# Patient Record
Sex: Male | Born: 1955 | Race: White | Hispanic: No | Marital: Married | Smoking: Never smoker
Health system: Southern US, Community
[De-identification: ages and names within clinical notes are randomized; demographics above are authoritative.]

## PROBLEM LIST (undated history)

## (undated) DIAGNOSIS — M109 Gout, unspecified: Secondary | ICD-10-CM

## (undated) DIAGNOSIS — K635 Polyp of colon: Secondary | ICD-10-CM

## (undated) DIAGNOSIS — N189 Chronic kidney disease, unspecified: Secondary | ICD-10-CM

## (undated) DIAGNOSIS — D649 Anemia, unspecified: Secondary | ICD-10-CM

## (undated) DIAGNOSIS — C61 Malignant neoplasm of prostate: Secondary | ICD-10-CM

## (undated) DIAGNOSIS — F32A Depression, unspecified: Secondary | ICD-10-CM

## (undated) DIAGNOSIS — K5792 Diverticulitis of intestine, part unspecified, without perforation or abscess without bleeding: Secondary | ICD-10-CM

## (undated) DIAGNOSIS — F419 Anxiety disorder, unspecified: Secondary | ICD-10-CM

## (undated) DIAGNOSIS — T7840XA Allergy, unspecified, initial encounter: Secondary | ICD-10-CM

## (undated) DIAGNOSIS — K219 Gastro-esophageal reflux disease without esophagitis: Secondary | ICD-10-CM

## (undated) DIAGNOSIS — Z973 Presence of spectacles and contact lenses: Secondary | ICD-10-CM

## (undated) DIAGNOSIS — F329 Major depressive disorder, single episode, unspecified: Secondary | ICD-10-CM

## (undated) DIAGNOSIS — I10 Essential (primary) hypertension: Secondary | ICD-10-CM

## (undated) DIAGNOSIS — I509 Heart failure, unspecified: Secondary | ICD-10-CM

## (undated) DIAGNOSIS — J302 Other seasonal allergic rhinitis: Secondary | ICD-10-CM

## (undated) DIAGNOSIS — G4730 Sleep apnea, unspecified: Secondary | ICD-10-CM

## (undated) DIAGNOSIS — J45909 Unspecified asthma, uncomplicated: Secondary | ICD-10-CM

## (undated) HISTORY — DX: Heart failure, unspecified: I50.9

## (undated) HISTORY — DX: Diverticulitis of intestine, part unspecified, without perforation or abscess without bleeding: K57.92

## (undated) HISTORY — DX: Anxiety disorder, unspecified: F41.9

## (undated) HISTORY — DX: Major depressive disorder, single episode, unspecified: F32.9

## (undated) HISTORY — DX: Anemia, unspecified: D64.9

## (undated) HISTORY — DX: Allergy, unspecified, initial encounter: T78.40XA

## (undated) HISTORY — DX: Malignant neoplasm of prostate: C61

## (undated) HISTORY — DX: Unspecified asthma, uncomplicated: J45.909

## (undated) HISTORY — PX: TONSILLECTOMY: SUR1361

## (undated) HISTORY — DX: Chronic kidney disease, unspecified: N18.9

## (undated) HISTORY — DX: Polyp of colon: K63.5

## (undated) HISTORY — DX: Sleep apnea, unspecified: G47.30

## (undated) HISTORY — PX: MENISCUS REPAIR: SHX5179

## (undated) HISTORY — PX: CARPAL TUNNEL RELEASE: SHX101

## (undated) HISTORY — PX: POLYPECTOMY: SHX149

## (undated) HISTORY — DX: Depression, unspecified: F32.A

---
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
## 1998-07-16 ENCOUNTER — Encounter: Payer: Self-pay | Admitting: Internal Medicine

## 1998-07-16 ENCOUNTER — Ambulatory Visit (HOSPITAL_COMMUNITY): Admission: RE | Admit: 1998-07-16 | Discharge: 1998-07-16 | Payer: Self-pay | Admitting: Internal Medicine

## 1998-09-04 ENCOUNTER — Encounter: Payer: Self-pay | Admitting: Gastroenterology

## 1998-09-04 ENCOUNTER — Ambulatory Visit (HOSPITAL_COMMUNITY): Admission: RE | Admit: 1998-09-04 | Discharge: 1998-09-04 | Payer: Self-pay | Admitting: Gastroenterology

## 1998-12-25 ENCOUNTER — Ambulatory Visit (HOSPITAL_COMMUNITY): Admission: RE | Admit: 1998-12-25 | Discharge: 1998-12-25 | Payer: Self-pay | Admitting: Gastroenterology

## 1998-12-25 ENCOUNTER — Encounter: Payer: Self-pay | Admitting: Gastroenterology

## 2013-02-03 HISTORY — PX: PROSTATECTOMY: SHX69

## 2013-12-02 ENCOUNTER — Other Ambulatory Visit: Payer: Self-pay | Admitting: Orthopedic Surgery

## 2013-12-13 ENCOUNTER — Encounter (HOSPITAL_BASED_OUTPATIENT_CLINIC_OR_DEPARTMENT_OTHER): Payer: Self-pay | Admitting: *Deleted

## 2013-12-13 NOTE — Progress Notes (Signed)
Will need istat ekg 

## 2013-12-13 NOTE — Progress Notes (Signed)
   12/13/13 1637  OBSTRUCTIVE SLEEP APNEA  Have you ever been diagnosed with sleep apnea through a sleep study? No  Do you snore loudly (loud enough to be heard through closed doors)?  1  Do you often feel tired, fatigued, or sleepy during the daytime? 0  Has anyone observed you stop breathing during your sleep? 0  Do you have, or are you being treated for high blood pressure? 1  BMI more than 35 kg/m2? 0  Age over 56 years old? 1  Neck circumference greater than 40 cm/16 inches? 1  Gender: 1  Obstructive Sleep Apnea Score 5  Score 4 or greater  Results sent to PCP

## 2013-12-15 ENCOUNTER — Ambulatory Visit (HOSPITAL_BASED_OUTPATIENT_CLINIC_OR_DEPARTMENT_OTHER): Payer: BC Managed Care – PPO | Admitting: Anesthesiology

## 2013-12-15 ENCOUNTER — Ambulatory Visit (HOSPITAL_BASED_OUTPATIENT_CLINIC_OR_DEPARTMENT_OTHER)
Admission: RE | Admit: 2013-12-15 | Discharge: 2013-12-15 | Disposition: A | Discharge status: Home / Self Care | Payer: BC Managed Care – PPO | Source: Ambulatory Visit | Attending: Orthopedic Surgery | Admitting: Orthopedic Surgery

## 2013-12-15 ENCOUNTER — Encounter (HOSPITAL_BASED_OUTPATIENT_CLINIC_OR_DEPARTMENT_OTHER): Payer: Self-pay | Admitting: Anesthesiology

## 2013-12-15 ENCOUNTER — Encounter (HOSPITAL_BASED_OUTPATIENT_CLINIC_OR_DEPARTMENT_OTHER)
Admission: RE | Disposition: A | Discharge status: Home / Self Care | Payer: Self-pay | Source: Ambulatory Visit | Attending: Orthopedic Surgery

## 2013-12-15 DIAGNOSIS — M109 Gout, unspecified: Secondary | ICD-10-CM | POA: Insufficient documentation

## 2013-12-15 DIAGNOSIS — I493 Ventricular premature depolarization: Secondary | ICD-10-CM | POA: Insufficient documentation

## 2013-12-15 DIAGNOSIS — M25842 Other specified joint disorders, left hand: Secondary | ICD-10-CM | POA: Insufficient documentation

## 2013-12-15 DIAGNOSIS — I10 Essential (primary) hypertension: Secondary | ICD-10-CM | POA: Diagnosis not present

## 2013-12-15 DIAGNOSIS — K219 Gastro-esophageal reflux disease without esophagitis: Secondary | ICD-10-CM | POA: Insufficient documentation

## 2013-12-15 HISTORY — DX: Other seasonal allergic rhinitis: J30.2

## 2013-12-15 HISTORY — PX: MASS EXCISION: SHX2000

## 2013-12-15 HISTORY — DX: Gastro-esophageal reflux disease without esophagitis: K21.9

## 2013-12-15 HISTORY — DX: Essential (primary) hypertension: I10

## 2013-12-15 HISTORY — DX: Presence of spectacles and contact lenses: Z97.3

## 2013-12-15 HISTORY — DX: Gout, unspecified: M10.9

## 2013-12-15 LAB — POCT I-STAT, CHEM 8
BUN: 15 mg/dL (ref 6–23)
Calcium, Ion: 1 mmol/L — ABNORMAL LOW (ref 1.12–1.23)
Chloride: 108 mEq/L (ref 96–112)
Creatinine, Ser: 1 mg/dL (ref 0.50–1.35)
Glucose, Bld: 106 mg/dL — ABNORMAL HIGH (ref 70–99)
HEMATOCRIT: 46 % (ref 39.0–52.0)
Hemoglobin: 15.6 g/dL (ref 13.0–17.0)
POTASSIUM: 4.1 meq/L (ref 3.7–5.3)
Sodium: 140 mEq/L (ref 137–147)
TCO2: 23 mmol/L (ref 0–100)

## 2013-12-15 SURGERY — EXCISION MASS
Anesthesia: Monitor Anesthesia Care | Site: Finger | Laterality: Left

## 2013-12-15 MED ORDER — BUPIVACAINE HCL (PF) 0.25 % IJ SOLN
INTRAMUSCULAR | Status: DC | PRN
  Administered 2013-12-15: 8 mL

## 2013-12-15 MED ORDER — MIDAZOLAM HCL 2 MG/2ML IJ SOLN: 1.0000 mg | INTRAMUSCULAR | Status: DC | PRN

## 2013-12-15 MED ORDER — CEFAZOLIN SODIUM-DEXTROSE 2-3 GM-% IV SOLR
INTRAVENOUS | Status: DC | PRN
  Administered 2013-12-15: 2 g via INTRAVENOUS

## 2013-12-15 MED ORDER — LIDOCAINE HCL (PF) 0.5 % IJ SOLN
INTRAMUSCULAR | Status: DC | PRN
  Administered 2013-12-15: 20 mL via INTRAVENOUS

## 2013-12-15 MED ORDER — CHLORHEXIDINE GLUCONATE 4 % EX LIQD: 60.0000 mL | Freq: Once | CUTANEOUS | Status: DC

## 2013-12-15 MED ORDER — ONDANSETRON HCL 4 MG/2ML IJ SOLN
INTRAMUSCULAR | Status: DC | PRN
  Administered 2013-12-15: 4 mg via INTRAVENOUS

## 2013-12-15 MED ORDER — FENTANYL CITRATE 0.05 MG/ML IJ SOLN: 50.0000 ug | INTRAMUSCULAR | Status: DC | PRN

## 2013-12-15 MED ORDER — MIDAZOLAM HCL 2 MG/2ML IJ SOLN
INTRAMUSCULAR | Status: AC
  Filled 2013-12-15: qty 2

## 2013-12-15 MED ORDER — CEFAZOLIN SODIUM-DEXTROSE 2-3 GM-% IV SOLR: 2.0000 g | INTRAVENOUS | Status: DC

## 2013-12-15 MED ORDER — OXYCODONE HCL 5 MG PO TABS: 5.0000 mg | ORAL_TABLET | Freq: Once | ORAL | Status: DC | PRN

## 2013-12-15 MED ORDER — 0.9 % SODIUM CHLORIDE (POUR BTL) OPTIME
TOPICAL | Status: DC | PRN
  Administered 2013-12-15: 120 mL

## 2013-12-15 MED ORDER — FENTANYL CITRATE 0.05 MG/ML IJ SOLN
INTRAMUSCULAR | Status: AC
  Filled 2013-12-15: qty 6

## 2013-12-15 MED ORDER — FENTANYL CITRATE 0.05 MG/ML IJ SOLN: 25.0000 ug | INTRAMUSCULAR | Status: DC | PRN

## 2013-12-15 MED ORDER — LACTATED RINGERS IV SOLN
INTRAVENOUS | Status: DC
  Administered 2013-12-15: 12:00:00 via INTRAVENOUS

## 2013-12-15 MED ORDER — MIDAZOLAM HCL 5 MG/5ML IJ SOLN
INTRAMUSCULAR | Status: DC | PRN
  Administered 2013-12-15: 2 mg via INTRAVENOUS

## 2013-12-15 MED ORDER — CEFAZOLIN SODIUM-DEXTROSE 2-3 GM-% IV SOLR
INTRAVENOUS | Status: AC
  Filled 2013-12-15: qty 50

## 2013-12-15 MED ORDER — PROPOFOL INFUSION 10 MG/ML OPTIME
INTRAVENOUS | Status: DC | PRN
  Administered 2013-12-15: 50 ug/kg/min via INTRAVENOUS

## 2013-12-15 MED ORDER — HYDROCODONE-ACETAMINOPHEN 5-325 MG PO TABS: 1.0000 | ORAL_TABLET | Freq: Four times a day (QID) | ORAL | Status: DC | PRN

## 2013-12-15 MED ORDER — FENTANYL CITRATE 0.05 MG/ML IJ SOLN
INTRAMUSCULAR | Status: DC | PRN
  Administered 2013-12-15 (×2): 50 ug via INTRAVENOUS

## 2013-12-15 MED ORDER — OXYCODONE HCL 5 MG/5ML PO SOLN: 5.0000 mg | Freq: Once | ORAL | Status: DC | PRN

## 2013-12-15 MED ORDER — ONDANSETRON HCL 4 MG/2ML IJ SOLN: 4.0000 mg | Freq: Four times a day (QID) | INTRAMUSCULAR | Status: DC | PRN

## 2013-12-15 SURGICAL SUPPLY — 51 items
BANDAGE COBAN STERILE 2 (GAUZE/BANDAGES/DRESSINGS) IMPLANT
BLADE MINI RND TIP GREEN BEAV (BLADE) ×2 IMPLANT
BLADE SURG 15 STRL LF DISP TIS (BLADE) ×1 IMPLANT
BLADE SURG 15 STRL SS (BLADE) ×1
BNDG COHESIVE 1X5 TAN STRL LF (GAUZE/BANDAGES/DRESSINGS) ×2 IMPLANT
BNDG COHESIVE 3X5 TAN STRL LF (GAUZE/BANDAGES/DRESSINGS) IMPLANT
BNDG ESMARK 4X9 LF (GAUZE/BANDAGES/DRESSINGS) ×2 IMPLANT
BNDG GAUZE ELAST 4 BULKY (GAUZE/BANDAGES/DRESSINGS) IMPLANT
CHLORAPREP W/TINT 26ML (MISCELLANEOUS) ×2 IMPLANT
CORDS BIPOLAR (ELECTRODE) ×2 IMPLANT
COVER BACK TABLE 60X90IN (DRAPES) ×2 IMPLANT
COVER MAYO STAND STRL (DRAPES) ×2 IMPLANT
CUFF TOURNIQUET SINGLE 18IN (TOURNIQUET CUFF) ×2 IMPLANT
DECANTER SPIKE VIAL GLASS SM (MISCELLANEOUS) IMPLANT
DRAIN PENROSE 1/2X12 LTX STRL (WOUND CARE) IMPLANT
DRAPE EXTREMITY T 121X128X90 (DRAPE) ×2 IMPLANT
DRAPE SURG 17X23 STRL (DRAPES) ×2 IMPLANT
GAUZE SPONGE 4X4 12PLY STRL (GAUZE/BANDAGES/DRESSINGS) ×2 IMPLANT
GAUZE XEROFORM 1X8 LF (GAUZE/BANDAGES/DRESSINGS) ×2 IMPLANT
GLOVE BIO SURGEON STRL SZ7 (GLOVE) ×2 IMPLANT
GLOVE BIOGEL PI IND STRL 7.5 (GLOVE) ×1 IMPLANT
GLOVE BIOGEL PI IND STRL 8.5 (GLOVE) ×1 IMPLANT
GLOVE BIOGEL PI INDICATOR 7.5 (GLOVE) ×1
GLOVE BIOGEL PI INDICATOR 8.5 (GLOVE) ×1
GLOVE EXAM NITRILE LRG STRL (GLOVE) ×2 IMPLANT
GLOVE SURG ORTHO 8.0 STRL STRW (GLOVE) ×2 IMPLANT
GOWN STRL REUS W/ TWL LRG LVL3 (GOWN DISPOSABLE) ×1 IMPLANT
GOWN STRL REUS W/TWL LRG LVL3 (GOWN DISPOSABLE) ×1
GOWN STRL REUS W/TWL XL LVL3 (GOWN DISPOSABLE) ×2 IMPLANT
NDL SAFETY ECLIPSE 18X1.5 (NEEDLE) IMPLANT
NEEDLE 27GAX1X1/2 (NEEDLE) ×2 IMPLANT
NEEDLE HYPO 18GX1.5 SHARP (NEEDLE)
NS IRRIG 1000ML POUR BTL (IV SOLUTION) ×2 IMPLANT
PACK BASIN DAY SURGERY FS (CUSTOM PROCEDURE TRAY) ×2 IMPLANT
PAD CAST 3X4 CTTN HI CHSV (CAST SUPPLIES) IMPLANT
PADDING CAST ABS 3INX4YD NS (CAST SUPPLIES)
PADDING CAST ABS 4INX4YD NS (CAST SUPPLIES)
PADDING CAST ABS COTTON 3X4 (CAST SUPPLIES) IMPLANT
PADDING CAST ABS COTTON 4X4 ST (CAST SUPPLIES) IMPLANT
PADDING CAST COTTON 3X4 STRL (CAST SUPPLIES)
SPLINT FINGER 3.25 BULB 911905 (SOFTGOODS) ×2 IMPLANT
SPLINT PLASTER CAST XFAST 3X15 (CAST SUPPLIES) IMPLANT
SPLINT PLASTER XTRA FASTSET 3X (CAST SUPPLIES)
STOCKINETTE 4X48 STRL (DRAPES) ×2 IMPLANT
SUT VIC AB 4-0 P2 18 (SUTURE) IMPLANT
SUT VICRYL RAPID 5 0 P 3 (SUTURE) ×2 IMPLANT
SUT VICRYL RAPIDE 4/0 PS 2 (SUTURE) IMPLANT
SYR BULB 3OZ (MISCELLANEOUS) ×2 IMPLANT
SYR CONTROL 10ML LL (SYRINGE) ×2 IMPLANT
TOWEL OR 17X24 6PK STRL BLUE (TOWEL DISPOSABLE) ×2 IMPLANT
UNDERPAD 30X30 INCONTINENT (UNDERPADS AND DIAPERS) ×2 IMPLANT

## 2013-12-15 NOTE — Op Note (Signed)
Dictation Number [id number]

## 2013-12-15 NOTE — Anesthesia Preprocedure Evaluation (Signed)
Anesthesia Evaluation  Patient identified by MRN, date of birth, ID band Patient awake    Reviewed: Allergy & Precautions, H&P , NPO status , Patient's Chart, lab work & pertinent test results  Airway Mallampati: II   Neck ROM: full    Dental   Pulmonary neg pulmonary ROS,          Cardiovascular hypertension,     Neuro/Psych    GI/Hepatic GERD-  ,  Endo/Other  obese  Renal/GU      Musculoskeletal   Abdominal   Peds  Hematology   Anesthesia Other Findings   Reproductive/Obstetrics                             Anesthesia Physical Anesthesia Plan  ASA: II  Anesthesia Plan: MAC and Bier Block   Post-op Pain Management:    Induction: Intravenous  Airway Management Planned: Simple Face Mask  Additional Equipment:   Intra-op Plan:   Post-operative Plan:   Informed Consent: I have reviewed the patients History and Physical, chart, labs and discussed the procedure including the risks, benefits and alternatives for the proposed anesthesia with the patient or authorized representative who has indicated his/her understanding and acceptance.     Plan Discussed with: CRNA, Anesthesiologist and Surgeon  Anesthesia Plan Comments:         Anesthesia Quick Evaluation

## 2013-12-15 NOTE — Brief Op Note (Signed)
12/15/2013  12:10 PM  PATIENT:  Raymond Alvarez  58 y.o. 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  PROCEDURE:  Procedure(s): EXCISION MUCOID TUMOR LEFT INDEX DEBRIDEMENT DISTAL INTERPHALANGEAL JOINT LEFT INDEX FINGER (Left)  SURGEON:  Surgeon(s) and Role:    * Daryll Brod, MD - Primary  PHYSICIAN ASSISTANT:   ASSISTANTS: none   ANESTHESIA:   local and regional  EBL:  Total I/O In: 400 [I.V.:400] Out: -   BLOOD ADMINISTERED:none  DRAINS: none   LOCAL MEDICATIONS USED:  BUPIVICAINE   SPECIMEN:  Excision  DISPOSITION OF SPECIMEN:  PATHOLOGY  COUNTS:  YES  TOURNIQUET:   Total Tourniquet Time Documented: Forearm (Left) - 24 minutes Total: Forearm (Left) - 24 minutes   DICTATION: .Other Dictation: Dictation Number [id number]  PLAN OF CARE: Discharge to home after PACU  PATIENT DISPOSITION:  PACU - hemodynamically stable.

## 2013-12-15 NOTE — Anesthesia Postprocedure Evaluation (Signed)
Anesthesia Post Note  Patient: Raymond Alvarez  Procedure(s) Performed: Procedure(s) (LRB): EXCISION MUCOID TUMOR LEFT INDEX DEBRIDEMENT DISTAL INTERPHALANGEAL JOINT LEFT INDEX FINGER (Left)  Anesthesia type: MAC  Patient location: PACU  Post pain: Pain level controlled and Adequate analgesia  Post assessment: Post-op Vital signs reviewed, Patient's Cardiovascular Status Stable and Respiratory Function Stable  Last Vitals:  Filed Vitals:   12/15/13 1215  BP: 145/96  Pulse: 79  Temp:   Resp: 17    Post vital signs: Reviewed and stable  Level of consciousness: awake, alert  and oriented  Complications: No apparent anesthesia complications

## 2013-12-15 NOTE — Op Note (Signed)
NAME:  MYLZ, YUAN NO.:  [id number]  MEDICAL RECORD NO.:  [id number]  LOCATION:                                 FACILITY:  PHYSICIAN:  Daryll Brod, M.D.            DATE OF BIRTH:  DATE OF PROCEDURE:  12/15/2013 DATE OF DISCHARGE:                              OPERATIVE REPORT   PREOPERATIVE DIAGNOSIS:  Mucoid tumor, left index finger.  POSTOPERATIVE DIAGNOSIS:  Mucoid tumor, left index finger.  OPERATION:  Excision of mucoid cyst with debridement distal interphalangeal joint, left index finger.  SURGEON:  Daryll Brod, M.D.  ANESTHESIA:  Forearm-based IV regional with local infiltration metacarpal block.  ANESTHESIOLOGIST:  Dr. Marcie Bal.  HISTORY:  The patient is a 58 year old male with a history of a mucoid tumor on his left index finger distal interphalangeal joint.  The skin is translucent.  He has elected to have this surgically excised along with debridement of the joint in effort to prevent recurrence.  Pre, peri, and postoperative course have been discussed along with risks and complications.  He is aware that there is no guarantee with the surgery possibility of infection; recurrence of injury to arteries, nerves, tendons, incomplete relief of symptoms, dystrophy.  In the preoperative area, the patient was seen, the extremity marked by both patient and surgeon.  Antibiotic given.  PROCEDURE IN DETAIL:  The patient was brought to the operating room, where forearm-based IV regional anesthetic was carried out without difficulty.  He was prepped using ChloraPrep, supine position with the left arm free.  A 3-minute dry time was allowed.  Time-out taken, confirming the patient and procedure.  A curvilinear incision was made over the distal interphalangeal joint, left index finger carried down through subcutaneous tissue.  Bleeders were electrocauterized with bipolar.  The cyst was then isolated underneath the skin creating a tunnel.  This was removed  with a House curette and a small hemostatic rongeur.  The joint was then opened.  Moderate swelling was present.  A synovectomy performed and debridement of the osteophytes on the middle phalanx were removed.  The specimen was sent to pathology.  The wound was copiously irrigated with saline and the skin closed with interrupted 5-0 Vicryl Rapide sutures.  A metacarpal block was given with 0.25% Marcaine without epinephrine, 8 mL was used.  Sterile compressive dressing and splint to the finger was applied.  On deflation of the tourniquet, all remaining fingers pinked. He was taken to the recovery room for observation in satisfactory condition.  He will be discharged home to return to the [facility] in 1 week on Vicodin.          ______________________________ Daryll Brod, M.D.     GK/MEDQ  D:  12/15/2013  T:  12/15/2013  Job:  [id number]

## 2013-12-15 NOTE — H&P (Signed)
  Raymond Alvarez is a 58 year-old left-hand dominant male referred by Dr. Erasmo Score for consultation with a mass on the dorsal aspect left index finger nail.  He states this has been present for five weeks.  His nail is grooved out to the tip  He states it has increased in size for period of time and is now stable. He recalls no history of injury.   He has family history of arthritis.  He has history of gout.  There is no history of diabetes, thyroid problems.  There is family history of diabetes, he has been tested.  He is not complaining of any pain or discomfort.  ALLERGIES:   None.  MEDICATIONS:    Azor, furosemide, allopurinol, fluticasone, omeprazole, ProAir and lorazepam.    SURGICAL HISTORY:      Tonsillectomy and appendectomy  FAMILY MEDICAL HISTORY:     Positive for  diabetes, heart disease, high blood pressure and arthritis.  SOCIAL HISTORY:     He does not smoke, drinks socially.  Married.  He is an Pensions consultant for [employer].  REVIEW OF SYSTEMS:   Positive for glasses, high blood pressure, asthma, stomach ulcer and pneumonia, otherwise negative 14 points. Raymond Alvarez is an 58 y.o. male.   Chief Complaint: mucoid tumor left index finger HPI: see above  Past Medical History  Diagnosis Date  . Hypertension   . Gout   . Seasonal allergies   . Wears glasses   . GERD (gastroesophageal reflux disease)     Past Surgical History  Procedure Laterality Date  . Tonsillectomy    . Colonoscopy      History reviewed. No pertinent family history. Social History:  reports that he has never smoked. He does not have any smokeless tobacco history on file. He reports that he drinks alcohol. He reports that he does not use illicit drugs.  Allergies:  Allergies  Allergen Reactions  . Other Anaphylaxis    TREE NUTS    No prescriptions prior to admission    No results found for this or any previous visit (from the past 48 hour(s)).  No  results found.   Pertinent items are noted in HPI.  Height 6\' 2"  (1.88 m), weight 113.399 kg (250 lb).  General appearance: alert, cooperative and appears stated age Head: Normocephalic, without obvious abnormality Neck: no JVD Resp: clear to auscultation bilaterally Cardio: regular rate and rhythm, S1, S2 normal, no murmur, click, rub or gallop GI: soft, non-tender; bowel sounds normal; no masses,  no organomegaly Extremities: mucoid tumor left index finger Pulses: 2+ and symmetric Skin: Skin color, texture, turgor normal. No rashes or lesions Neurologic: Grossly normal Incision/Wound: na  Assessment/Plan RADIOGRAPHS:     X-rays of his hands reveals minimal degenerative changes.  DIAGNOSIS:      Mucoid tumor, left index finger, distal interphalangeal joint.  RECOMMENDATIONS/PLAN:    We have discussed possibility of excision of the cyst with debridement of the joint.  The pre, peri and postoperative course were discussed along with the risks and complications.  The patient is aware there is no guarantee with the surgery, possibility of infection, recurrence, injury to arteries, nerves, tendons, incomplete relief of symptoms and dystrophy.  This is scheduled for excision mucoid tumor, debridement distal interphalangeal joint left index finger.  Raymond Alvarez R 12/15/2013, 9:34 AM

## 2013-12-15 NOTE — Discharge Instructions (Addendum)

## 2013-12-15 NOTE — Transfer of Care (Signed)
Immediate Anesthesia Transfer of Care Note  Patient: Raymond Alvarez  Procedure(s) Performed: Procedure(s): EXCISION MUCOID TUMOR LEFT INDEX DEBRIDEMENT DISTAL INTERPHALANGEAL JOINT LEFT INDEX FINGER (Left)  Patient Location: PACU  Anesthesia Type:MAC  Level of Consciousness: awake, alert , oriented and patient cooperative  Airway & Oxygen Therapy: Patient Spontanous Breathing and Patient connected to face mask oxygen  Post-op Assessment: Report given to PACU RN and Post -op Vital signs reviewed and stable  Post vital signs: Reviewed and stable  Complications: No apparent anesthesia complications

## 2013-12-16 ENCOUNTER — Encounter (HOSPITAL_BASED_OUTPATIENT_CLINIC_OR_DEPARTMENT_OTHER): Payer: Self-pay | Admitting: Orthopedic Surgery

## 2015-01-22 ENCOUNTER — Ambulatory Visit: Payer: Self-pay | Admitting: Allergy and Immunology

## 2015-03-20 DIAGNOSIS — Z79899 Other long term (current) drug therapy: Secondary | ICD-10-CM | POA: Insufficient documentation

## 2015-03-20 DIAGNOSIS — F419 Anxiety disorder, unspecified: Secondary | ICD-10-CM

## 2015-03-20 DIAGNOSIS — I493 Ventricular premature depolarization: Secondary | ICD-10-CM

## 2015-03-20 DIAGNOSIS — E538 Deficiency of other specified B group vitamins: Secondary | ICD-10-CM | POA: Insufficient documentation

## 2015-03-20 DIAGNOSIS — E291 Testicular hypofunction: Secondary | ICD-10-CM | POA: Insufficient documentation

## 2015-03-20 DIAGNOSIS — I10 Essential (primary) hypertension: Secondary | ICD-10-CM

## 2015-03-20 DIAGNOSIS — R5383 Other fatigue: Secondary | ICD-10-CM | POA: Insufficient documentation

## 2015-03-20 DIAGNOSIS — Z6836 Body mass index (BMI) 36.0-36.9, adult: Secondary | ICD-10-CM | POA: Insufficient documentation

## 2015-03-20 DIAGNOSIS — J45909 Unspecified asthma, uncomplicated: Secondary | ICD-10-CM | POA: Insufficient documentation

## 2015-03-20 DIAGNOSIS — M5136 Other intervertebral disc degeneration, lumbar region: Secondary | ICD-10-CM | POA: Insufficient documentation

## 2015-03-20 DIAGNOSIS — R002 Palpitations: Secondary | ICD-10-CM | POA: Insufficient documentation

## 2015-03-20 DIAGNOSIS — M1A09X0 Idiopathic chronic gout, multiple sites, without tophus (tophi): Secondary | ICD-10-CM | POA: Insufficient documentation

## 2015-03-20 DIAGNOSIS — I517 Cardiomegaly: Secondary | ICD-10-CM | POA: Insufficient documentation

## 2015-03-20 DIAGNOSIS — M51369 Other intervertebral disc degeneration, lumbar region without mention of lumbar back pain or lower extremity pain: Secondary | ICD-10-CM | POA: Insufficient documentation

## 2015-03-20 DIAGNOSIS — Z6835 Body mass index (BMI) 35.0-35.9, adult: Secondary | ICD-10-CM

## 2015-03-20 HISTORY — DX: Body mass index (BMI) 35.0-35.9, adult: Z68.35

## 2015-03-20 HISTORY — DX: Ventricular premature depolarization: I49.3

## 2015-03-20 HISTORY — DX: Morbid (severe) obesity due to excess calories: E66.01

## 2015-03-20 HISTORY — DX: Anxiety disorder, unspecified: F41.9

## 2015-03-20 HISTORY — DX: Palpitations: R00.2

## 2015-03-20 HISTORY — DX: Essential (primary) hypertension: I10

## 2015-03-20 HISTORY — DX: Unspecified asthma, uncomplicated: J45.909

## 2016-07-31 DIAGNOSIS — K219 Gastro-esophageal reflux disease without esophagitis: Secondary | ICD-10-CM

## 2016-07-31 HISTORY — DX: Gastro-esophageal reflux disease without esophagitis: K21.9

## 2016-09-24 HISTORY — PX: COLONOSCOPY: SHX174

## 2016-12-10 HISTORY — PX: ESOPHAGOGASTRODUODENOSCOPY: SHX1529

## 2017-01-07 DIAGNOSIS — D500 Iron deficiency anemia secondary to blood loss (chronic): Secondary | ICD-10-CM | POA: Insufficient documentation

## 2017-01-07 HISTORY — DX: Iron deficiency anemia secondary to blood loss (chronic): D50.0

## 2017-06-11 DIAGNOSIS — Z4789 Encounter for other orthopedic aftercare: Secondary | ICD-10-CM | POA: Insufficient documentation

## 2017-08-18 ENCOUNTER — Encounter: Payer: Self-pay | Admitting: Gastroenterology

## 2017-09-02 ENCOUNTER — Telehealth: Payer: Self-pay | Admitting: Gastroenterology

## 2017-09-02 NOTE — Telephone Encounter (Signed)
Pharmacy calling stating patient needs refill of medication dicyclomine.

## 2017-09-03 MED ORDER — DICYCLOMINE HCL 10 MG PO CAPS
10.0000 mg | ORAL_CAPSULE | Freq: Two times a day (BID) | ORAL | 0 refills | Status: DC
Start: 2017-09-03 — End: 2017-09-16

## 2017-09-03 NOTE — Telephone Encounter (Signed)
Sent refill to patients pharmacy. 

## 2017-09-15 ENCOUNTER — Telehealth: Payer: Self-pay | Admitting: Gastroenterology

## 2017-09-15 NOTE — Telephone Encounter (Signed)
Last prescription refill on Bentyl was sent in as BID and he states that he takes it TID.  Please advise.  Thank you.

## 2017-09-16 ENCOUNTER — Other Ambulatory Visit: Payer: Self-pay

## 2017-09-16 DIAGNOSIS — R1013 Epigastric pain: Secondary | ICD-10-CM

## 2017-09-16 MED ORDER — DICYCLOMINE HCL 10 MG PO CAPS: 10.0000 mg | ORAL_CAPSULE | Freq: Three times a day (TID) | ORAL | 6 refills | Status: DC

## 2017-09-16 NOTE — Telephone Encounter (Signed)
New prescription sent to pharmacy 

## 2017-09-16 NOTE — Telephone Encounter (Signed)
3 times a day would be fine Pl call in 90 with 6 refills

## 2017-09-28 ENCOUNTER — Encounter: Payer: Self-pay | Admitting: Gastroenterology

## 2017-10-07 ENCOUNTER — Ambulatory Visit (INDEPENDENT_AMBULATORY_CARE_PROVIDER_SITE_OTHER): Payer: BC Managed Care – PPO | Admitting: Gastroenterology

## 2017-10-07 ENCOUNTER — Encounter (INDEPENDENT_AMBULATORY_CARE_PROVIDER_SITE_OTHER): Payer: Self-pay

## 2017-10-07 ENCOUNTER — Encounter: Payer: Self-pay | Admitting: Gastroenterology

## 2017-10-07 VITALS — BP 122/86 | HR 91 | Ht 74.0 in | Wt 294.4 lb

## 2017-10-07 DIAGNOSIS — R1013 Epigastric pain: Secondary | ICD-10-CM | POA: Diagnosis not present

## 2017-10-07 DIAGNOSIS — K580 Irritable bowel syndrome with diarrhea: Secondary | ICD-10-CM

## 2017-10-07 MED ORDER — DICYCLOMINE HCL 10 MG PO CAPS: 10.0000 mg | ORAL_CAPSULE | Freq: Three times a day (TID) | ORAL | 11 refills | Status: DC

## 2017-10-07 MED ORDER — HYOSCYAMINE SULFATE 0.125 MG SL SUBL: 0.1250 mg | SUBLINGUAL_TABLET | SUBLINGUAL | 8 refills | Status: AC | PRN

## 2017-10-07 NOTE — Progress Notes (Signed)
IMPRESSION and PLAN:    #1. IBS with diarrhea -Continue bentyl 10mg  po qid #120 with 11 refills -Continue Levsin 0.125mg  sublingual every 4-6 hours as needed, #120 with 8 refills #2. GERD with small HH #3. Fatty liver -Continue walking every day and gradually reduce weight. -Follow-up in 1 year, earlier in case of any problems.      HPI:    Chief Complaint:   Raymond Alvarez is a 62 y.o. male  Here for medication refill. Doing great. No GI complaints Has been gradually reducing weight.  Past Medical History:  Diagnosis Date  . Anemia   . Anxiety   . Asthma   . Chronic kidney disease   . Colon polyp   . Depression   . Diverticulitis   . GERD (gastroesophageal reflux disease)   . Gout   . Hypertension   . Prostate cancer ([place])   . Seasonal allergies   . Wears glasses     Current Outpatient Medications  Medication Sig Dispense Refill  . albuterol (PROVENTIL HFA;VENTOLIN HFA) 108 (90 BASE) MCG/ACT inhaler Inhale into the lungs every 6 (six) hours as needed for wheezing or shortness of breath.    . allopurinol (ZYLOPRIM) 300 MG tablet Take 300 mg by mouth daily.    Marland Kitchen amLODipine-olmesartan (AZOR) 10-40 MG per tablet Take 1 tablet by mouth daily.    . diclofenac sodium (VOLTAREN) 1 % GEL Applied to knee every morning    . dicyclomine (BENTYL) 10 MG capsule Take 1 capsule (10 mg total) by mouth 3 (three) times daily before meals. 90 capsule 6  . EPINEPHrine 0.3 mg/0.3 mL IJ SOAJ injection Inject into the muscle once. emergency    . fluticasone (FLONASE) 50 MCG/ACT nasal spray Place into both nostrils daily.    . furosemide (LASIX) 20 MG tablet Take 20 mg by mouth. Takes 1/2    . LORazepam (ATIVAN) 0.5 MG tablet Take 0.5 mg by mouth as needed.     . Multiple Vitamins-Minerals (AIRBORNE PO) Take by mouth every morning.    . Olopatadine HCl (PAZEO) 0.7 % SOLN 1 drop in each eye daily    . omeprazole (PRILOSEC) 20 MG capsule Take 20 mg by mouth daily.    . Probiotic  Product (PROBIOTIC-10 PO) Take 1 tablet by mouth daily.    Marland Kitchen spironolactone (ALDACTONE) 25 MG tablet Take 1 tablet by mouth daily.    Marland Kitchen testosterone (ANDROGEL) 50 MG/5GM (1%) GEL Place 5 g onto the skin daily.    Marland Kitchen vortioxetine HBr (TRINTELLIX) 20 MG TABS tablet Take 1 tablet by mouth daily.     No current facility-administered medications for this visit.     Past Surgical History:  Procedure Laterality Date  . APPENDECTOMY  1970  . COLONOSCOPY  09/24/2016   Colonic polyp status post polypectomy. Tubular adenoma.   . ESOPHAGOGASTRODUODENOSCOPY  12/10/2016   Small hiatal hernia. Mild gastritis. Gastric polyps status post polypectomy x 4   . MASS EXCISION Left 12/15/2013   Procedure: EXCISION MUCOID TUMOR LEFT INDEX DEBRIDEMENT DISTAL INTERPHALANGEAL JOINT LEFT INDEX FINGER;  Surgeon: Daryll Brod, MD;  Location: [facility];  Service: Orthopedics;  Laterality: Left;  . PROSTATECTOMY  2015  . TONSILLECTOMY      Family History  Problem Relation Age of Onset  . Breast cancer Mother   . Prostate cancer Father   . Prostate cancer Maternal Grandfather     Social History   Tobacco Use  . Smoking status:  Never Smoker  . Smokeless tobacco: Never Used  Substance Use Topics  . Alcohol use: Yes    Comment: occ  . Drug use: No    Allergies  Allergen Reactions  . Other Anaphylaxis    TREE NUTS  . Sulfa Antibiotics      Review of Systems: All systems reviewed and negative except where noted in HPI.    Physical Exam:     BP 122/86   Pulse 91   Ht 6\' 2"  (1.88 m)   Wt 294 lb 6 oz (133.5 kg)   BMI 37.80 kg/m  @WEIGHTLAST3 @ GENERAL:  Alert, oriented, cooperative, not in acute distress. PSYCH: :Pleasant, normal mood and affect. HEENT:  conjunctiva pink, mucous membranes moist, neck supple without masses. No jaundice. CARDIAC:  S1 S2 normal. No murmers. PULM: Normal respiratory effort, lungs CTA bilaterally, no wheezing. ABDOMEN: Inspection: No visible  peristalsis, no abnormal pulsations, skin normal.  Palpation/percussion: Soft, nontender, nondistended, no rigidity, no abnormal dullness to percussion, no hepatosplenomegaly and no palpable abdominal masses.  Auscultation: Normal bowel sounds, no abdominal bruits. Rectal exam: Deferred SKIN:  turgor, no lesions seen. Musculoskeletal:  Normal muscle tone, normal strength. NEURO: Alert and oriented x 3, no focal neurologic deficits.    Mattia Osterman,MD 10/07/2017, 4:07 PM   CC Townsend Roger, MD

## 2017-10-07 NOTE — Patient Instructions (Signed)
If you are age 60 or older, your body mass index should be between 23-30. Your Body mass index is 37.8 kg/m. If this is out of the aforementioned range listed, please consider follow up with your Primary Care Provider.  If you are age 23 or younger, your body mass index should be between 19-25. Your Body mass index is 37.8 kg/m. If this is out of the aformentioned range listed, please consider follow up with your Primary Care Provider.   We have sent the following medications to your pharmacy for you to pick up at your convenience: [place]  Thank you,  Dr. Jackquline Denmark'

## 2018-02-22 DIAGNOSIS — M25551 Pain in right hip: Secondary | ICD-10-CM | POA: Insufficient documentation

## 2018-05-11 ENCOUNTER — Inpatient Hospital Stay (HOSPITAL_COMMUNITY)
Admission: AD | Admit: 2018-05-11 | Discharge: 2018-05-17 | DRG: 177 | Disposition: A | Discharge status: Home / Self Care | Payer: BC Managed Care – PPO | Source: Other Acute Inpatient Hospital | Attending: Internal Medicine | Admitting: Internal Medicine

## 2018-05-11 DIAGNOSIS — Z803 Family history of malignant neoplasm of breast: Secondary | ICD-10-CM | POA: Diagnosis not present

## 2018-05-11 DIAGNOSIS — M6282 Rhabdomyolysis: Secondary | ICD-10-CM | POA: Diagnosis not present

## 2018-05-11 DIAGNOSIS — J45909 Unspecified asthma, uncomplicated: Secondary | ICD-10-CM | POA: Diagnosis present

## 2018-05-11 DIAGNOSIS — J1289 Other viral pneumonia: Secondary | ICD-10-CM | POA: Diagnosis present

## 2018-05-11 DIAGNOSIS — K219 Gastro-esophageal reflux disease without esophagitis: Secondary | ICD-10-CM | POA: Diagnosis not present

## 2018-05-11 DIAGNOSIS — Z91018 Allergy to other foods: Secondary | ICD-10-CM

## 2018-05-11 DIAGNOSIS — E669 Obesity, unspecified: Secondary | ICD-10-CM

## 2018-05-11 DIAGNOSIS — E222 Syndrome of inappropriate secretion of antidiuretic hormone: Secondary | ICD-10-CM | POA: Diagnosis not present

## 2018-05-11 DIAGNOSIS — A084 Viral intestinal infection, unspecified: Secondary | ICD-10-CM | POA: Diagnosis not present

## 2018-05-11 DIAGNOSIS — E861 Hypovolemia: Secondary | ICD-10-CM | POA: Diagnosis not present

## 2018-05-11 DIAGNOSIS — K5730 Diverticulosis of large intestine without perforation or abscess without bleeding: Secondary | ICD-10-CM | POA: Diagnosis not present

## 2018-05-11 DIAGNOSIS — R0602 Shortness of breath: Secondary | ICD-10-CM

## 2018-05-11 DIAGNOSIS — M109 Gout, unspecified: Secondary | ICD-10-CM | POA: Diagnosis not present

## 2018-05-11 DIAGNOSIS — Z6837 Body mass index (BMI) 37.0-37.9, adult: Secondary | ICD-10-CM | POA: Diagnosis not present

## 2018-05-11 DIAGNOSIS — R6889 Other general symptoms and signs: Secondary | ICD-10-CM

## 2018-05-11 DIAGNOSIS — Z8042 Family history of malignant neoplasm of prostate: Secondary | ICD-10-CM | POA: Diagnosis not present

## 2018-05-11 DIAGNOSIS — D890 Polyclonal hypergammaglobulinemia: Secondary | ICD-10-CM | POA: Diagnosis present

## 2018-05-11 DIAGNOSIS — E871 Hypo-osmolality and hyponatremia: Secondary | ICD-10-CM

## 2018-05-11 DIAGNOSIS — K449 Diaphragmatic hernia without obstruction or gangrene: Secondary | ICD-10-CM | POA: Diagnosis present

## 2018-05-11 DIAGNOSIS — K760 Fatty (change of) liver, not elsewhere classified: Secondary | ICD-10-CM | POA: Diagnosis not present

## 2018-05-11 DIAGNOSIS — Z8546 Personal history of malignant neoplasm of prostate: Secondary | ICD-10-CM | POA: Diagnosis not present

## 2018-05-11 DIAGNOSIS — R112 Nausea with vomiting, unspecified: Secondary | ICD-10-CM | POA: Diagnosis present

## 2018-05-11 DIAGNOSIS — E876 Hypokalemia: Secondary | ICD-10-CM | POA: Diagnosis not present

## 2018-05-11 DIAGNOSIS — D649 Anemia, unspecified: Secondary | ICD-10-CM | POA: Diagnosis present

## 2018-05-11 DIAGNOSIS — Z20822 Contact with and (suspected) exposure to covid-19: Secondary | ICD-10-CM

## 2018-05-11 DIAGNOSIS — R911 Solitary pulmonary nodule: Secondary | ICD-10-CM | POA: Diagnosis not present

## 2018-05-11 DIAGNOSIS — I10 Essential (primary) hypertension: Secondary | ICD-10-CM | POA: Diagnosis not present

## 2018-05-11 DIAGNOSIS — I447 Left bundle-branch block, unspecified: Secondary | ICD-10-CM | POA: Diagnosis not present

## 2018-05-11 DIAGNOSIS — E538 Deficiency of other specified B group vitamins: Secondary | ICD-10-CM | POA: Diagnosis present

## 2018-05-11 DIAGNOSIS — K7689 Other specified diseases of liver: Secondary | ICD-10-CM | POA: Diagnosis present

## 2018-05-11 DIAGNOSIS — Z7951 Long term (current) use of inhaled steroids: Secondary | ICD-10-CM

## 2018-05-11 DIAGNOSIS — I493 Ventricular premature depolarization: Secondary | ICD-10-CM | POA: Diagnosis present

## 2018-05-11 DIAGNOSIS — Z882 Allergy status to sulfonamides status: Secondary | ICD-10-CM

## 2018-05-11 DIAGNOSIS — E1169 Type 2 diabetes mellitus with other specified complication: Secondary | ICD-10-CM

## 2018-05-11 DIAGNOSIS — E1165 Type 2 diabetes mellitus with hyperglycemia: Secondary | ICD-10-CM | POA: Diagnosis present

## 2018-05-11 HISTORY — DX: Hypo-osmolality and hyponatremia: E87.1

## 2018-05-11 LAB — MRSA PCR SCREENING: MRSA by PCR: NEGATIVE

## 2018-05-11 MED ORDER — ACETAMINOPHEN 325 MG PO TABS: 650.0000 mg | ORAL_TABLET | Freq: Four times a day (QID) | ORAL | Status: DC | PRN

## 2018-05-11 MED ORDER — LOPERAMIDE HCL 2 MG PO CAPS
2.0000 mg | ORAL_CAPSULE | ORAL | Status: DC | PRN
  Filled 2018-05-11: qty 1

## 2018-05-11 MED ORDER — ENOXAPARIN SODIUM 40 MG/0.4ML ~~LOC~~ SOLN
40.0000 mg | SUBCUTANEOUS | Status: DC
  Administered 2018-05-12 – 2018-05-17 (×6): 40 mg via SUBCUTANEOUS
  Filled 2018-05-11 (×6): qty 0.4

## 2018-05-11 MED ORDER — ONDANSETRON HCL 4 MG/2ML IJ SOLN: 4.0000 mg | Freq: Four times a day (QID) | INTRAMUSCULAR | Status: DC | PRN

## 2018-05-11 MED ORDER — GUAIFENESIN-DM 100-10 MG/5ML PO SYRP
10.0000 mL | ORAL_SOLUTION | ORAL | Status: DC | PRN
  Filled 2018-05-11: qty 10

## 2018-05-11 MED ORDER — SODIUM CHLORIDE 0.9 % IV SOLN
INTRAVENOUS | Status: DC
  Administered 2018-05-11: via INTRAVENOUS

## 2018-05-11 MED ORDER — HYDROCOD POLST-CPM POLST ER 10-8 MG/5ML PO SUER: 5.0000 mL | Freq: Two times a day (BID) | ORAL | Status: DC | PRN

## 2018-05-11 NOTE — H&P (Addendum)
History and Physical    Raymond Alvarez KPT:[id number] DOB: 12/19/1955 DOA: 05/11/2018  PCP: Raina Mina., MD Patient coming from: [facility] ED   Chief Complaint: Vomiting, diarrhea  HPI: Raymond Alvarez is a 63 y.o. male with medical history significant of hypertension, CKD, asthma, anxiety, depression, gout, GERD presenting as a transfer from [facility] ED for evaluation of vomiting and diarrhea. Patient states 2 weeks ago he had bronchitis for which his PCP prescribed azithromycin.  He has continued to cough since then.  Denies any shortness of breath.  Does report exposure to a coworker 2 weeks ago who had a family member positive for COVID-19.  States for the past 3 to 4 days he has been having nausea, NBNB emesis, and nonbloody diarrhea.  He has not been able to keep any food down and now continues to have dry heaves.  Reports having generalized weakness. Denies any abdominal pain. States he had a fever 2 weeks ago but no recent fevers.  ED Course: Vitals: Afebrile, not tachycardic or tachypneic.  Blood pressure stable.  Not hypoxic.  Labs: Sodium 112, potassium 4.3, chloride 77, bicarb 22, anion gap 17, BUN 15, creatinine 0.9 AST 168, ALT 155, alk phos 66, T bili 0.6, CK 1081, troponin negative, amylase normal, lipase normal WBC 5.8 with lymphopenia, hemoglobin 15.1, platelet count 183 Lactic acid normal LDH 793, CRP 20, procalcitonin 0.17, ferritin 555 UA not suggestive of infection. Influenza panel negative. Chest CT showing a spectrum of findings in the lungs which can be seen with acute atypical infection, in particular, viral pneumonia including COVID-19.  CT also showing a 7 mm left lower lobe nodule increased from 5 mm on the prior CT.  Recommend follow-up CT chest in 6 months to reassess for stability versus further enlargement. CT abdomen pelvis negative for acute finding.  Hepatic steatosis.  Mildly complicated right liver cyst without change from the prior CT.  Scattered  left colon diverticula.  No diverticulitis.  Mild aortic atherosclerosis.  Medications administered in the ED: 2 L normal saline boluses  Per Dr. Lonny Prude who accepted admission: He recommended PCCM consult for hypertonic saline but PCCM recommended hospitalist admission.  He discussed with nephrology who recommended normal saline.  Review of Systems: As per HPI otherwise 10 point review of systems negative.  Past Medical History:  Diagnosis Date  . Anemia   . Anxiety   . Asthma   . Chronic kidney disease   . Colon polyp   . Depression   . Diverticulitis   . GERD (gastroesophageal reflux disease)   . Gout   . Hypertension   . Prostate cancer ([place])   . Seasonal allergies   . Wears glasses     Past Surgical History:  Procedure Laterality Date  . APPENDECTOMY  1970  . COLONOSCOPY  09/24/2016   Colonic polyp status post polypectomy. Tubular adenoma.   . ESOPHAGOGASTRODUODENOSCOPY  12/10/2016   Small hiatal hernia. Mild gastritis. Gastric polyps status post polypectomy x 4   . 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;  Service: Orthopedics;  Laterality: Left;  . PROSTATECTOMY  2015  . TONSILLECTOMY       reports that he has never smoked. He has never used smokeless tobacco. He reports current alcohol use. He reports that he does not use drugs.  Allergies  Allergen Reactions  . Other Anaphylaxis  TREE NUTS  . Sulfa Antibiotics     Family History  Problem Relation Age of Onset  . Breast cancer Mother   . Prostate cancer Father   . Prostate cancer Maternal Grandfather     Prior to Admission medications   Medication Sig Start Date End Date Taking? Authorizing Provider  allopurinol (ZYLOPRIM) 300 MG tablet Take 300 mg by mouth daily.   Yes [provider]  dicyclomine (BENTYL) 10 MG capsule Take 1 capsule  (10 mg total) by mouth 4 (four) times daily -  before meals and at bedtime. 10/07/17  Yes Jackquline Denmark, MD  hyoscyamine (LEVSIN SL) 0.125 MG SL tablet Place 1 tablet (0.125 mg total) under the tongue every 4 (four) hours as needed. 10/07/17  Yes Jackquline Denmark, MD  indomethacin (INDOCIN) 25 MG capsule Take 25 mg by mouth daily.   Yes [provider]  lisinopril-hydrochlorothiazide (PRINZIDE,ZESTORETIC) 20-25 MG tablet Take 1 tablet by mouth daily.   Yes [provider]  omeprazole (PRILOSEC) 20 MG capsule Take 20 mg by mouth daily.   Yes [provider]  spironolactone (ALDACTONE) 25 MG tablet Take 1 tablet by mouth daily. 10/06/17  Yes [provider]  vortioxetine HBr (TRINTELLIX) 20 MG TABS tablet Take 1 tablet by mouth daily.   Yes [provider]  albuterol (PROVENTIL HFA;VENTOLIN HFA) 108 (90 BASE) MCG/ACT inhaler Inhale into the lungs every 6 (six) hours as needed for wheezing or shortness of breath.    [provider]  amLODipine-olmesartan (AZOR) 10-40 MG per tablet Take 1 tablet by mouth daily.    [provider]  diclofenac sodium (VOLTAREN) 1 % GEL Applied to knee every morning    [provider]  EPINEPHrine 0.3 mg/0.3 mL IJ SOAJ injection Inject into the muscle once. emergency    [provider]  fluticasone (FLONASE) 50 MCG/ACT nasal spray Place into both nostrils daily.    [provider]  furosemide (LASIX) 20 MG tablet Take 20 mg by mouth. Takes 1/2    [provider]  LORazepam (ATIVAN) 0.5 MG tablet Take 0.5 mg by mouth as needed.     [provider]  Multiple Vitamins-Minerals (AIRBORNE PO) Take by mouth every morning.    [provider]  Olopatadine HCl (PAZEO) 0.7 % SOLN 1 drop in each eye daily 03/20/15   [provider]  Probiotic Product (PROBIOTIC-10 PO) Take 1 tablet by mouth daily.    [provider]  testosterone (ANDROGEL) 50 MG/5GM (1%) GEL  Place 5 g onto the skin daily.    [provider]    Physical Exam: Vitals:   05/11/18 2202 05/11/18 2344  BP: (!) 137/93 123/78  Pulse: 90 88  Resp: 19 16  Temp: 98.7 F (37.1 C) 98.6 F (37 C)  TempSrc: Oral Oral  SpO2: 99% 97%    Physical Exam  Constitutional: He is oriented to person, place, and time. He appears well-developed and well-nourished. No distress.  HENT:  Head: Normocephalic.  Eyes: Right eye exhibits no discharge. Left eye exhibits no discharge.  Neck: Neck supple.  Cardiovascular: Normal rate, regular rhythm and intact distal pulses.  Pulmonary/Chest: Effort normal and breath sounds normal. No respiratory distress. He has no wheezes. He has no rales.  Coughing  Abdominal: Soft. Bowel sounds are normal. He exhibits no distension. There is no abdominal tenderness. There is no rebound and no guarding.  Musculoskeletal:        General: No edema.  Neurological: He  is alert and oriented to person, place, and time.  Skin: Skin is warm and dry. He is not diaphoretic.     Labs on Admission: I have personally reviewed following labs and imaging studies  CBC: Recent Labs  Lab 05/11/18 2313  WBC 5.8  HGB 13.8  HCT 37.8*  MCV 83.1  PLT 917   Basic Metabolic Panel: Recent Labs  Lab 05/11/18 2313  NA 114*  K 3.8  CL 83*  CO2 21*  GLUCOSE 85  BUN 12  CREATININE 1.12  CALCIUM 8.2*   GFR: CrCl cannot be calculated (Unknown ideal weight.). Liver Function Tests: Recent Labs  Lab 05/11/18 2313  AST 126*  ALT 116*  ALKPHOS 62  BILITOT 0.7  PROT 5.9*  ALBUMIN 3.3*   No results for input(s): LIPASE, AMYLASE in the last 168 hours. No results for input(s): AMMONIA in the last 168 hours. Coagulation Profile: No results for input(s): INR, PROTIME in the last 168 hours. Cardiac Enzymes: No results for input(s): CKTOTAL, CKMB, CKMBINDEX, TROPONINI in the last 168 hours. BNP (last 3 results) No results for input(s): PROBNP in the last 8760  hours. HbA1C: No results for input(s): HGBA1C in the last 72 hours. CBG: No results for input(s): GLUCAP in the last 168 hours. Lipid Profile: No results for input(s): CHOL, HDL, LDLCALC, TRIG, CHOLHDL, LDLDIRECT in the last 72 hours. Thyroid Function Tests: No results for input(s): TSH, T4TOTAL, FREET4, T3FREE, THYROIDAB in the last 72 hours. Anemia Panel: No results for input(s): VITAMINB12, FOLATE, FERRITIN, TIBC, IRON, RETICCTPCT in the last 72 hours. Urine analysis: No results found for: COLORURINE, APPEARANCEUR, LABSPEC, PHURINE, GLUCOSEU, HGBUR, BILIRUBINUR, KETONESUR, PROTEINUR, UROBILINOGEN, NITRITE, LEUKOCYTESUR  Radiological Exams on Admission: No results found.  Assessment/Plan Principal Problem:   Hyponatremia Active Problems:   Viral gastroenteritis   Suspected Covid-19 Virus Infection   Rhabdomyolysis   Pulmonary nodule   Severe hyponatremia In the setting of GI loss from vomiting and diarrhea. Labs at [facility] showing sodium 112.  Patient is asymptomatic.  No change in mental status or seizures.  Patient received 2 L normal saline boluses in the ED.  Repeat labs here showing sodium 114.  -Discussed with nephrology.  Recommendation is to start hypertonic saline infusion at 20 cc/hr.  In addition, recommending IV desmopressin 2 mcg every 6 hours for a total of 48 hours.  Goal sodium correction of 6 mEq in 24 hours.  -Neurochecks every 2 hours. Repeat BMP every 2 hours. Check urine sodium, osmolarity. Check serum osmolarity.   -Discussed with PCCM, patient transferred to the ICU.   Acute viral gastroenteritis Patient is presenting with complaints of nausea, vomiting, and diarrhea.  Labs done at [facility] showing AST 168, ALT 155, alk phos 66, and T bili 0.6.  Lipase normal. CT abdomen pelvis done at [facility] negative for acute finding.  Hepatic steatosis.  Mildly complicated right liver cyst without change from the prior CT.  Scattered left colon diverticula.  No  diverticulitis. -IV fluid hydration -GI pathogen panel -Loperamide PRN -Zofran PRN nausea/vomiting  Concern for COVID-19 Patient is presenting with a viral GI illness and cough.  Reports recent exposure to a family member of a positive case.  Labs at [place] showing lymphopenia. LDH 793, CRP 20, procalcitonin 0.17, ferritin 555. Chest CT done at [facility] showing a spectrum of findings in the lungs which can be seen with acute atypical infection, in particular, viral pneumonia including COVID-19.   -SARS-CoV-2 testing has been ordered -Droplet and contact precautions -Respiratory  viral panel -Antitussives as needed for cough  Mild rhabdomyolysis CK 1081 on labs done at [facility].  Patient received 2 L normal saline boluses in the ED. -Repeat CK -Careful IV fluid hydration to avoid rapid correction of hyponatremia  Pulmonary nodule CT chest done at [facility] showing a 7 mm left lower lobe nodule increased from 5 mm on the prior CT.   -Recommend follow-up CT chest in 6 months to reassess for stability versus further enlargement.  Hypertension -Currently normotensive.  Continue to monitor blood pressure.  Asthma -Stable.  No bronchospasm.  Albuterol inhaler as needed.  DVT prophylaxis: Lovenox Code Status: Full code Family Communication: No family available. Disposition Plan: Anticipate discharge after clinical improvement. Consults: Nephrology (Dr. Johnney Ou), PCCM (Dr. Janann Colonel) Admission status: It is my clinical opinion that admission to INPATIENT is reasonable and necessary in this 63 y.o. male . presenting with severe hyponatremia, viral gastroenteritis, concern for COVID-19 . and pertinent positives on radiographic and laboratory data including: Labs with evidence of severe hyponatremia.  Labs with lymphopenia. . Workup and treatment include IV hypertonic saline and IV desmopressin for severe hyponatremia.  Monitoring serum sodium every 2 hours.  Additional treatment plan mentioned  above.  Given the aforementioned, the predictability of an adverse outcome is felt to be significant. I expect that the patient will require at least 2 midnights in the hospital to treat this condition.   This chart was dictated using voice recognition software.  Despite best efforts to proofread, errors can occur which can change the documentation meaning.  Shela Leff MD [facility] Pager [phone]  If 7PM-7AM, please contact night-coverage [email or web address] Password [facility]  05/12/2018, 12:59 AM

## 2018-05-12 ENCOUNTER — Other Ambulatory Visit: Payer: Self-pay

## 2018-05-12 ENCOUNTER — Encounter (HOSPITAL_COMMUNITY): Payer: Self-pay | Admitting: *Deleted

## 2018-05-12 DIAGNOSIS — R112 Nausea with vomiting, unspecified: Secondary | ICD-10-CM | POA: Diagnosis present

## 2018-05-12 DIAGNOSIS — E222 Syndrome of inappropriate secretion of antidiuretic hormone: Secondary | ICD-10-CM | POA: Diagnosis present

## 2018-05-12 DIAGNOSIS — Z8616 Personal history of COVID-19: Secondary | ICD-10-CM | POA: Insufficient documentation

## 2018-05-12 DIAGNOSIS — Z20822 Contact with and (suspected) exposure to covid-19: Secondary | ICD-10-CM

## 2018-05-12 DIAGNOSIS — M6282 Rhabdomyolysis: Secondary | ICD-10-CM

## 2018-05-12 DIAGNOSIS — Z8042 Family history of malignant neoplasm of prostate: Secondary | ICD-10-CM | POA: Diagnosis not present

## 2018-05-12 DIAGNOSIS — J45909 Unspecified asthma, uncomplicated: Secondary | ICD-10-CM | POA: Diagnosis present

## 2018-05-12 DIAGNOSIS — K219 Gastro-esophageal reflux disease without esophagitis: Secondary | ICD-10-CM | POA: Diagnosis present

## 2018-05-12 DIAGNOSIS — Z882 Allergy status to sulfonamides status: Secondary | ICD-10-CM | POA: Diagnosis not present

## 2018-05-12 DIAGNOSIS — I10 Essential (primary) hypertension: Secondary | ICD-10-CM | POA: Diagnosis present

## 2018-05-12 DIAGNOSIS — Z6837 Body mass index (BMI) 37.0-37.9, adult: Secondary | ICD-10-CM | POA: Diagnosis not present

## 2018-05-12 DIAGNOSIS — R6889 Other general symptoms and signs: Secondary | ICD-10-CM

## 2018-05-12 DIAGNOSIS — R911 Solitary pulmonary nodule: Secondary | ICD-10-CM

## 2018-05-12 DIAGNOSIS — Z7951 Long term (current) use of inhaled steroids: Secondary | ICD-10-CM | POA: Diagnosis not present

## 2018-05-12 DIAGNOSIS — E861 Hypovolemia: Secondary | ICD-10-CM | POA: Diagnosis not present

## 2018-05-12 DIAGNOSIS — A084 Viral intestinal infection, unspecified: Secondary | ICD-10-CM

## 2018-05-12 DIAGNOSIS — D649 Anemia, unspecified: Secondary | ICD-10-CM | POA: Diagnosis present

## 2018-05-12 DIAGNOSIS — E871 Hypo-osmolality and hyponatremia: Secondary | ICD-10-CM | POA: Diagnosis not present

## 2018-05-12 DIAGNOSIS — K760 Fatty (change of) liver, not elsewhere classified: Secondary | ICD-10-CM | POA: Diagnosis present

## 2018-05-12 DIAGNOSIS — K449 Diaphragmatic hernia without obstruction or gangrene: Secondary | ICD-10-CM | POA: Diagnosis present

## 2018-05-12 DIAGNOSIS — Z91018 Allergy to other foods: Secondary | ICD-10-CM | POA: Diagnosis not present

## 2018-05-12 DIAGNOSIS — K5730 Diverticulosis of large intestine without perforation or abscess without bleeding: Secondary | ICD-10-CM | POA: Diagnosis present

## 2018-05-12 DIAGNOSIS — K7689 Other specified diseases of liver: Secondary | ICD-10-CM | POA: Diagnosis present

## 2018-05-12 DIAGNOSIS — Z803 Family history of malignant neoplasm of breast: Secondary | ICD-10-CM | POA: Diagnosis not present

## 2018-05-12 DIAGNOSIS — Z8546 Personal history of malignant neoplasm of prostate: Secondary | ICD-10-CM | POA: Diagnosis not present

## 2018-05-12 DIAGNOSIS — M109 Gout, unspecified: Secondary | ICD-10-CM | POA: Diagnosis present

## 2018-05-12 DIAGNOSIS — J1289 Other viral pneumonia: Secondary | ICD-10-CM | POA: Diagnosis present

## 2018-05-12 HISTORY — DX: Personal history of COVID-19: Z86.16

## 2018-05-12 HISTORY — DX: Viral intestinal infection, unspecified: A08.4

## 2018-05-12 HISTORY — DX: Rhabdomyolysis: M62.82

## 2018-05-12 HISTORY — DX: Contact with and (suspected) exposure to covid-19: Z20.822

## 2018-05-12 HISTORY — DX: Solitary pulmonary nodule: R91.1

## 2018-05-12 LAB — SODIUM
Sodium: 115 mmol/L — CL (ref 135–145)
Sodium: 116 mmol/L — CL (ref 135–145)
Sodium: 117 mmol/L — CL (ref 135–145)
Sodium: 117 mmol/L — CL (ref 135–145)
Sodium: 116 mmol/L — CL (ref 135–145)
Sodium: 117 mmol/L — CL (ref 135–145)
Sodium: 117 mmol/L — CL (ref 135–145)

## 2018-05-12 LAB — RESPIRATORY PANEL BY PCR

## 2018-05-12 LAB — CBC
HCT: 37.8 % — ABNORMAL LOW (ref 39.0–52.0)
Hemoglobin: 13.8 g/dL (ref 13.0–17.0)
MCH: 30.3 pg (ref 26.0–34.0)
MCHC: 36.5 g/dL — ABNORMAL HIGH (ref 30.0–36.0)
MCV: 83.1 fL (ref 80.0–100.0)
Platelets: 186 10*3/uL (ref 150–400)
RBC: 4.55 MIL/uL (ref 4.22–5.81)
RDW: 12.6 % (ref 11.5–15.5)
WBC: 5.8 10*3/uL (ref 4.0–10.5)
nRBC: 0 % (ref 0.0–0.2)

## 2018-05-12 LAB — COMPREHENSIVE METABOLIC PANEL
ALT: 116 U/L — ABNORMAL HIGH (ref 0–44)
AST: 126 U/L — ABNORMAL HIGH (ref 15–41)
Albumin: 3.3 g/dL — ABNORMAL LOW (ref 3.5–5.0)
Alkaline Phosphatase: 62 U/L (ref 38–126)
Anion gap: 10 (ref 5–15)
BUN: 12 mg/dL (ref 6–20)
CO2: 21 mmol/L — ABNORMAL LOW (ref 22–32)
Calcium: 8.2 mg/dL — ABNORMAL LOW (ref 8.9–10.3)
Chloride: 83 mmol/L — ABNORMAL LOW (ref 98–111)
Creatinine, Ser: 1.12 mg/dL (ref 0.61–1.24)
GFR calc Af Amer: >60 mL/min (ref >60)
GFR calc non Af Amer: >60 mL/min (ref >60)
Glucose, Bld: 85 mg/dL (ref 70–99)
Potassium: 3.8 mmol/L (ref 3.5–5.1)
Sodium: 114 mmol/L — CL (ref 135–145)
Total Bilirubin: 0.7 mg/dL (ref 0.3–1.2)
Total Protein: 5.9 g/dL — ABNORMAL LOW (ref 6.5–8.1)

## 2018-05-12 LAB — SODIUM, URINE, RANDOM: Sodium, Ur: 37 mmol/L

## 2018-05-12 LAB — CK: Total CK: 826 U/L — ABNORMAL HIGH (ref 49–397)

## 2018-05-12 LAB — TSH: TSH: 0.939 u[IU]/mL (ref 0.350–4.500)

## 2018-05-12 LAB — OSMOLALITY: Osmolality: 243 mOsm/kg — CL (ref 275–295)

## 2018-05-12 LAB — OSMOLALITY, URINE: Osmolality, Ur: 344 mOsm/kg (ref 300–900)

## 2018-05-12 LAB — CORTISOL-AM, BLOOD: Cortisol - AM: 11.7 ug/dL (ref 6.7–22.6)

## 2018-05-12 MED ORDER — ALBUTEROL SULFATE HFA 108 (90 BASE) MCG/ACT IN AERS
1.0000 | INHALATION_SPRAY | RESPIRATORY_TRACT | Status: DC | PRN
  Filled 2018-05-12: qty 6.7

## 2018-05-12 MED ORDER — SODIUM CHLORIDE 3 % IV SOLN: INTRAVENOUS | Status: DC

## 2018-05-12 MED ORDER — SODIUM CHLORIDE 3 % IV SOLN
INTRAVENOUS | Status: DC
  Administered 2018-05-12: 20 mL/h via INTRAVENOUS
  Administered 2018-05-13: 30 mL/h via INTRAVENOUS
  Filled 2018-05-12 (×2): qty 500

## 2018-05-12 MED ORDER — SODIUM CHLORIDE 0.9 % IV SOLN
2.0000 ug | Freq: Four times a day (QID) | INTRAVENOUS | Status: AC
  Administered 2018-05-12 – 2018-05-13 (×8): 2 ug via INTRAVENOUS
  Filled 2018-05-12 (×9): qty 0.5

## 2018-05-12 MED ORDER — SODIUM CHLORIDE 3 % IV SOLN
INTRAVENOUS | Status: DC
  Administered 2018-05-12: 20 mL/h via INTRAVENOUS
  Filled 2018-05-12: qty 500

## 2018-05-12 NOTE — Progress Notes (Addendum)
PROGRESS NOTE    Raymond Alvarez  HUT:[id number] DOB: [birth year] DOA: 05/11/2018 PCP: Raymond Alvarez., MD   Brief Narrative:  HPI per Dr. Shela Alvarez on 05/11/2018 The patient Raymond Alvarez is a 63 y.o. male with medical history significant of hypertension, CKD, asthma, anxiety, depression, gout, GERD presenting as a transfer from [facility] ED for evaluation of vomiting and diarrhea. Patient states 2 weeks ago he had bronchitis for which his PCP prescribed azithromycin. He has continued to cough since then.  Denies any shortness of breath.  Does report exposure to a coworker 2 weeks ago who had a family member positive for COVID-19.  States for the past 3 to 4 days he has been having nausea, NBNB emesis, and nonbloody diarrhea.  He has not been able to keep any food down and now continues to have dry heaves.  Reports having generalized weakness. Denies any abdominal pain. States he had a fever 2 weeks ago but no recent fevers.  Interim History: Nephrology and PCCM consulted and patient was admitted to the ICU. Currently now on 3% Hypertonic Saline at 10 mL/hr and Na+ is slowly improving and is 117.   Assessment & Plan:   Principal Problem:   Hyponatremia Active Problems:   Viral gastroenteritis   Suspected Covid-19 Virus Infection   Rhabdomyolysis   Pulmonary nodule  Severe Hyponatremia -In the setting of GI loss from vomiting and diarrhea and likley Hypovolemic Hyponatremia but could have SIADH component and Nephrology suspects . Labs at [place] showing sodium 112.   -Patient is asymptomatic.  No change in mental status or seizures.   -Patient received 2 L normal saline boluses in the ED.   -Repeat labs here showing sodium 114 and repeat this AM was 115 and increased to 116 and latest Na+ was 117 -Discussed with Nephrology.   -Recommendation is to start hypertonic saline infusion at 20 cc/hr and will not increase Rate at this time.  -In addition, recommending IV Desmopressin 2 mg  every 6 hours for a total of 48 hours.   -Goal sodium correction of 6 mEq in 24 hours.  -Neurochecks every 2 hours. Repeat BMP every 2 hours.  -Checked Urine Sodium and was 37 and Urine Osm was 344; Check serum osmolarity.   -Discussed with PCCM, patient transferred to the ICU. -Continue Further Care per Nephrology   Acute Viral Gastroenteritis -Patient was presenting with complaints of nausea, vomiting, and diarrhea.   -Labs done at [facility] showing AST 168, ALT 155, alk phos 66, and T bili 0.6.   -Lipase normal.  -CT abdomen pelvis done at [facility] negative for acute finding but did show Hepatic steatosis.  Mildly complicated right liver cyst without change from the prior CT.  Scattered left colon diverticula.  No diverticulitis. -C/w IV fluid hydration delineated by Nephrology  -GI pathogen panel ordered and pending to be done  -C/w Loperamide 14m po PRN Diarrhea or Loose Stools -Zofran PRN Nausea/Vomiting  Concern for COVID-19 -Patient is presenting with a viral GI illness and cough.  Reports recent exposure to a family member of a positive case.   [facility]. LDH 793, CRP 20, procalcitonin 0.17, ferritin 555.  [facility] which can be seen with acute atypical infection, in particular, viral pneumonia including COVID-19.   -SARS-CoV-2 testing has been ordered -C/w Droplet and contact precautions -Respiratory viral panel pending  -MRSA PCR was Negative  -Antitussives as needed for cough (  Robitussin DM and Tussionex) and Albuterol Inhaler -Currently on Room Air, Not Leukopenic; Will Check CBC with Differential in AM  -Continue to Monitor for Desaturations and Respiratory Status   Mild Rhabdomyolysis/Elevated CK -CK 1081 on labs done at [facility].   -Patient received 2 L normal saline boluses in the ED and is now on 3% of Hypertonic Saline at 20 mEQ and Nephrology recommending checking Na+ before  increasing rate due to risk of Overcorrection.  -Repeat CK this AM was 826 -Careful IV fluid hydration to avoid rapid correction of hyponatremia  Pulmonary Nodule -CT chest done at [facility] showing a 7 mm left lower lobe nodule increased from 5 mm on the prior CT.   -Recommend follow-up CT chest in 6 months to reassess for stability versus further enlargement.  Hypertension -Currently Normotensive.   -Continue to monitor blood pressure per Protocol -Last BP was 113/74  Asthma -Stable.  -Currently having No bronchospasm.  -C/w Guaifenesin-Dextromethorphan 10 mL po q4hprn Cough along with Chlorpheniramine-Hydrocodone 5 mL po q12h -C/w Albuterol Inhaler 1-2 puff IH q4hprn   Obesity -Estimated body mass index is 37.8 kg/m as calculated from the following:   Height as of 10/07/17: _0  (1.88 m).   Weight as of 10/07/17: 133.5 kg. -Weight Loss and Dietary Counseling given  Abnormal LFTs/Elevated LFTs -Patient's AST was 126 and ALT was 116; ? Related to hepatic Steatosis but likley from Nausea and Vomiting -Continue to Monitor and Trend and repeat CMP in AM -If worsening or not improving will check Acute Hepatitis Panel and RUQ U/S -Repeat CMP in AM   DVT prophylaxis: Enoxaparin 40 mg sq q24h Code Status: FULL CODE Family Communication: No family present at bedside and attempted to call wife to update with no response Disposition Plan: Remain in the ICU for continued Monitoring as he is on Hypertonic Saline   Consultants:   PCCM/Pulmonary  Nephrology    Procedures: None   Antimicrobials:  Anti-infectives (From admission, onward)   None     Subjective: Seen and examined at bedside and states his nausea and vomiting has improved.  No chest pain, lightheadedness or dizziness. Denies any lightheadedness or dizziness. No other concerns or complaints at this time.   Objective: Vitals:   05/12/18 0400 05/12/18 0500 05/12/18 0600 05/12/18 0700  BP: 124/77 124/78 (!) 129/91  131/73  Pulse: 84 88 82 89  Resp: _1 Temp:      TempSrc:      SpO2: 94% 96% 96% 98%    Intake/Output Summary (Last 24 hours) at 05/12/2018 0740 Last data filed at 05/12/2018 0700 Gross per 24 hour  Intake 327.71 ml  Output 600 ml  Net -272.29 ml   There were no vitals filed for this visit.  Examination: Physical Exam:  Constitutional: WN/WD morbidly obese Caucasian male in NAD and appears calm and comfortable Eyes: Lids and conjunctivae normal, sclerae anicteric  ENMT: External Ears, Nose appear normal. Grossly normal hearing.  Neck: Appears normal, supple, no cervical masses, normal ROM, no appreciable thyromegaly Respiratory: Diminished to auscultation bilaterally, no wheezing, rales, rhonchi or crackles. Normal respiratory effort and patient is not tachypenic. No accessory muscle use.  Cardiovascular: RRR, no murmurs / rubs / gallops. S1 and S2 auscultated. Trace extremity edema. 2+ pedal pulses. No carotid bruits.  Abdomen: Soft, non-tender, Distended 2/2 body habitus. No masses palpated. No appreciable hepatosplenomegaly. Bowel sounds positive x4.  GU: Deferred. Musculoskeletal: No clubbing / cyanosis of digits/nails. No joint deformity upper and lower  extremities. Skin: No rashes, lesions, ulcers on a limited skin evaluation. No induration; Warm and dry.  Neurologic: CN 2-12 grossly intact with no focal deficits. Romberg sign and cerebellar reflexes not assessed.  Psychiatric: Normal judgment and insight. Alert and oriented x 3. Normal mood and appropriate affect.   Data Reviewed: I have personally reviewed following labs and imaging studies  CBC: Recent Labs  Lab 05/11/18 2313  WBC 5.8  HGB 13.8  HCT 37.8*  MCV 83.1  PLT 949   Basic Metabolic Panel: Recent Labs  Lab 05/11/18 2313  NA 114*  K 3.8  CL 83*  CO2 21*  GLUCOSE 85  BUN 12  CREATININE 1.12  CALCIUM 8.2*   GFR: CrCl cannot be calculated (Unknown ideal weight.). Liver Function Tests:  Recent Labs  Lab 05/11/18 2313  AST 126*  ALT 116*  ALKPHOS 62  BILITOT 0.7  PROT 5.9*  ALBUMIN 3.3*   No results for input(s): LIPASE, AMYLASE in the last 168 hours. No results for input(s): AMMONIA in the last 168 hours. Coagulation Profile: No results for input(s): INR, PROTIME in the last 168 hours. Cardiac Enzymes: Recent Labs  Lab 05/12/18 0035  CKTOTAL 826*   BNP (last 3 results) No results for input(s): PROBNP in the last 8760 hours. HbA1C: No results for input(s): HGBA1C in the last 72 hours. CBG: No results for input(s): GLUCAP in the last 168 hours. Lipid Profile: No results for input(s): CHOL, HDL, LDLCALC, TRIG, CHOLHDL, LDLDIRECT in the last 72 hours. Thyroid Function Tests: No results for input(s): TSH, T4TOTAL, FREET4, T3FREE, THYROIDAB in the last 72 hours. Anemia Panel: No results for input(s): VITAMINB12, FOLATE, FERRITIN, TIBC, IRON, RETICCTPCT in the last 72 hours. Sepsis Labs: No results for input(s): PROCALCITON, LATICACIDVEN in the last 168 hours.  Recent Results (from the past 240 hour(s))  MRSA PCR Screening     Status: None   Collection Time: 05/11/18 10:11 PM  Result Value Ref Range Status   MRSA by PCR NEGATIVE NEGATIVE Final    Comment:        The GeneXpert MRSA Assay (FDA approved for NASAL specimens only), is one component of a comprehensive MRSA colonization surveillance program. It is not intended to diagnose MRSA infection nor to guide or monitor treatment for MRSA infections. Performed at [facility], [place] [address] [place], [place] [address]     Radiology Studies: No results found.  Scheduled Meds: . enoxaparin (LOVENOX) injection  40 mg Subcutaneous Q24H   Continuous Infusions: . desmopressin (DDAVP) IV Stopped (05/12/18 0509)  . sodium chloride (hypertonic) 20 mL/hr at 05/12/18 0700    LOS: 1 day   Kerney Elbe, DO [facility] PAGER is on [place]  If 7PM-7AM, please contact night-coverage  [email or web address] Password [facility] 05/12/2018, 7:40 AM

## 2018-05-12 NOTE — Consult Note (Signed)
..   NAME:  Raymond Alvarez, MRN:  [id number], DOB:  1955/02/19, LOS: 1 ADMISSION DATE:  05/11/2018, CONSULTATION DATE:  05/12/2018 REFERRING MD:  Marlowe Sax MD, CHIEF COMPLAINT:  Severe Hyponatremia (not symptomatic)   Brief History   63 yr old M w/ PMHx Anemia, Asthma, Depression, prostate CA, GERD, CKD presenting from [place] with complaints of vomiting and diarrhea. R?O COVID 19. PCCM consulted for Hyponatremia mgmt  History of present illness   63 yr old M w/ PMHx  Anemia, Asthma, Depression, prostate CA, GERD, CKD presenting from [place] with complaints of vomitting and diarrhea. Per the accounts of other providers and review of EMR  Pt states 2 weeks ago he had a bronchitis that was Rx with azithromycin for 5 days. Despite abx he continued to cough. He reported possible COVID 19 exposure via a coworker who has a family member that is positive.  For the last 3-4 days he has had nausea, non bloody non bilious emesis and non bloody diarrhea. He has been unable to tolerate PO and has had bouts of dry heaves along with generalized weakness. He denies abdominal pain. He does report having febrile illness 2 weeks ago but none recently.  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 -Vit B12 deficiency  -Hypogonadism -PVC Active Ambulatory Problems    Diagnosis Date Noted   No Active Ambulatory Problems   Resolved Ambulatory Problems    Diagnosis Date Noted   No Resolved Ambulatory Problems   Past Medical History:  Diagnosis Date   Anemia    Anxiety    Asthma    Chronic kidney disease    Colon polyp    Depression    Diverticulitis    GERD (gastroesophageal reflux disease)    Gout    Hypertension    Prostate cancer ([place])    Seasonal allergies    Wears glasses       Significant Hospital Events   Seen at [facility] CT Chest showed a left lower lobe nodule (increased to 7 mm from 5 mm   Consults:  Nephrology- 05/11/2018 PCCM - 05/12/2018  Procedures:  none  Significant Diagnostic Tests:  Labs: Sodium 112, potassium 4.3, chloride 77, bicarb 22, anion gap 17, BUN 15, creatinine 0.9 AST 168, ALT 155, alk phos 66, T bili 0.6, CK 1081, troponin negative, amylase normal, lipase normal WBC 5.8 with lymphopenia, hemoglobin 15.1, platelet count 183 Lactic acid wnl  LDH 793, CRP 20, procalcitonin 0.17, ferritin 555 UA not suggestive of infection. Influenza panel negative.  Micro Data:  Pending COVID 19 test negative MRSA PCR  Antimicrobials:  Not currently on antibiotics   Interim history/subjective:  After 2L NS bolus Sodium 112 went to 114 Nephrology recommended 3% hypertonic at 20cc/hr  [facility] consulted PCCM for ICU transfer due to the need for hypertonic saline  Objective   Blood pressure 123/78, pulse 88, temperature 98.6 F (37 C), temperature source Oral, resp. rate 16, SpO2 97 %.        Intake/Output Summary (Last 24 hours) at 05/12/2018 0210 Last data filed at 05/12/2018 0050 Gross per 24 hour  Intake 240 ml  Output 300 ml  Net -60 ml   There were no vitals filed for this visit.  Examination: General: well nourished in no acute distress HENT: normocephalic atraumatic Lungs:  no wheezing no rhonchi no use of accessory muscles  Cardiovascular: S1 and S2 appreciated without r/m/g Abdomen: soft non distended no tenderness Extremities: no edema or atrophy Neuro: appropriate alert and oriented Skin: warm dry and intact   Assessment & Plan:  1. Severe Hypovolemic Hyponatremia - Asymptomatic - pt has a h/o Viral gastroenteritis with both vomiting, diarrheal illness and inability to tolerate PO. After 2 L NS bolus pt went up by 2 mEq  - no evidence of altered mental status, or seizure activity - not actively hypotensive Plan: Per  Nephrology's recommendations- starting on hypertonic saline- no bolus 20cc/hr An+ Q 2 hrs, BMP Q 4 hrs Also receiving desmopressin 2 mcg Q 6 hrs to temper overcorrection UOsm and SOsm ordered Correction should not exceed more than 6-8 meq in 24 hrs Discussed with [facility] Dr Marlowe Sax and pt will be moved to ICU and [facility] will continue primary management.  No need for PCCM to follow at this juncture.  Should hypertonic saline rate exceed 75 cc/hr (warranting a CVC) or pt become symptomatic please re-consult [place] at that time for assistance with care.   2. Acute Viral Gastroenteritis - CTA/P showed no diverticulitis Plan: Per Primary team Continuing on IVF. Zofran PRN and f/u GI path panel  3. R/O COVID19 Viral gastroenteritis and h/o URI and persistent cough + sick contact PCT 0.17 CRP 20 Ferritin 555 with LDH 793  CT chest concerning for possible infection Plan: F/u COVID19 testing Not currently on Plaquenil or Azithromycin or Zinc Continue on Droplet and contact precautions F/u RVP Supplemental O2 to keep Sats >92%- if Oxygen exceeds 4L-> requires negative pressure  4. Pulmonary nodule: Per report the nodule Increased from 5 mm to 7 mm Pt is a never smoker- low risk Actual imaging not available in EMR Plan: F/U in 6 months is appropriate for low risk patient with nodule 6- 63m regardless if solid, semi solid or GGO  Can be followed with outpatient Pulmonary  Best practice:  Diet: per primary team Pain/Anxiety/Delirium protocol (if indicated): not indicated VAP protocol (if indicated): not indicated DVT prophylaxis: Lovenox GI prophylaxis: not indicated Glucose control: if BG exceeds 1848mdl recommend ISS Mobility: Bedrest Code Status: Full Family Communication : per Primary team  Disposition: ICU under [facility]   CBC: Recent Labs  Lab 05/11/18 2313  WBC 5.8  HGB 13.8  HCT 37.8*  MCV 83.1  PLT 18967  Basic Metabolic Panel: Recent Labs  Lab 05/11/18 2313  NA 114*   K 3.8  CL 83*  CO2 21*  GLUCOSE 85  BUN 12  CREATININE 1.12  CALCIUM 8.2*   GFR: CrCl cannot be calculated (Unknown ideal weight.). Recent Labs  Lab 05/11/18 2313  WBC 5.8    Liver Function Tests: Recent Labs  Lab 05/11/18 2313  AST 126*  ALT 116*  ALKPHOS 62  BILITOT 0.7  PROT 5.9*  ALBUMIN 3.3*   No results for input(s): LIPASE, AMYLASE in the last 168 hours. No results for input(s): AMMONIA in the last 168 hours.  ABG    Component Value Date/Time   TCO2 23 12/15/2013 1129     Coagulation Profile: No results for input(s): INR, PROTIME in the last 168 hours.  Cardiac Enzymes: Recent Labs  Lab 05/12/18 0035  CKTOTAL 826*    HbA1C: No results found for: HGBA1C  CBG: No results for input(s): GLUCAP in the last 168 hours.  Review of Systems:   ..[place] Kitchen[place]land [place] of Systems  Constitutional: Negative  for fever.  HENT: Negative.   Eyes: Negative.   Respiratory: Positive for cough.   Gastrointestinal: Positive for diarrhea, nausea and vomiting.  Genitourinary: Negative.   Musculoskeletal: Negative.   Skin: Negative.   Neurological: Negative.   Endo/Heme/Allergies: Negative.   Psychiatric/Behavioral: Negative.      Past Medical History  He,  has a past medical history of Anemia, Anxiety, Asthma, Chronic kidney disease, Colon polyp, Depression, Diverticulitis, GERD (gastroesophageal reflux disease), Gout, Hypertension, Prostate cancer ([place]), Seasonal allergies, and Wears glasses.   Surgical History    Past Surgical History:  Procedure Laterality Date   APPENDECTOMY  1970   COLONOSCOPY  09/24/2016   Colonic polyp status post polypectomy. Tubular adenoma.    ESOPHAGOGASTRODUODENOSCOPY  12/10/2016   Small hiatal hernia. Mild gastritis. Gastric polyps status post polypectomy x 4    MASS EXCISION Left 12/15/2013   Procedure: EXCISION MUCOID TUMOR LEFT INDEX DEBRIDEMENT DISTAL INTERPHALANGEAL JOINT LEFT INDEX FINGER;  Surgeon: Daryll Brod, MD;  Location:  [place];  Service: Orthopedics;  Laterality: Left;   PROSTATECTOMY  2015   TONSILLECTOMY       Social History   reports that he has never smoked. He has never used smokeless tobacco. He reports current alcohol use. He reports that he does not use drugs.   Family History   His family history includes Breast cancer in his mother; Prostate cancer in his father and maternal grandfather.   Allergies Allergies  Allergen Reactions   Other Anaphylaxis    TREE NUTS   Sulfa Antibiotics      Home Medications  Prior to Admission medications   Medication Sig Start Date End Date Taking? Authorizing Provider  allopurinol (ZYLOPRIM) 300 MG tablet Take 300 mg by mouth daily.   Yes [provider]  dicyclomine (BENTYL) 10 MG capsule Take 1 capsule (10 mg total) by mouth 4 (four) times daily -  before meals and at bedtime. 10/07/17  Yes Jackquline Denmark, MD  hyoscyamine (LEVSIN SL) 0.125 MG SL tablet Place 1 tablet (0.125 mg total) under the tongue every 4 (four) hours as needed. 10/07/17  Yes Jackquline Denmark, MD  indomethacin (INDOCIN) 25 MG capsule Take 25 mg by mouth daily.   Yes [provider]  lisinopril-hydrochlorothiazide (PRINZIDE,ZESTORETIC) 20-25 MG tablet Take 1 tablet by mouth daily.   Yes [provider]  omeprazole (PRILOSEC) 20 MG capsule Take 20 mg by mouth daily.   Yes [provider]  spironolactone (ALDACTONE) 25 MG tablet Take 1 tablet by mouth daily. 10/06/17  Yes [provider]  vortioxetine HBr (TRINTELLIX) 20 MG TABS tablet Take 1 tablet by mouth daily.   Yes [provider]  albuterol (PROVENTIL HFA;VENTOLIN HFA) 108 (90 BASE) MCG/ACT inhaler Inhale into the lungs every 6 (six) hours as needed for wheezing or shortness of breath.    [provider]  amLODipine-olmesartan (AZOR) 10-40 MG per tablet Take 1 tablet by mouth daily.    [provider]  diclofenac sodium (VOLTAREN) 1 % GEL Applied to  knee every morning    [provider]  EPINEPHrine 0.3 mg/0.3 mL IJ SOAJ injection Inject into the muscle once. emergency    [provider]  fluticasone (FLONASE) 50 MCG/ACT nasal spray Place into both nostrils daily.    [provider]  furosemide (LASIX) 20 MG tablet Take 20 mg by mouth. Takes 1/2    [provider]  LORazepam (ATIVAN) 0.5 MG tablet Take 0.5 mg by mouth as needed.  [provider]  Multiple Vitamins-Minerals (AIRBORNE PO) Take by mouth every morning.    [provider]  Olopatadine HCl (PAZEO) 0.7 % SOLN 1 drop in each eye daily 03/20/15   [provider]  Probiotic Product (PROBIOTIC-10 PO) Take 1 tablet by mouth daily.    [provider]  testosterone (ANDROGEL) 50 MG/5GM (1%) GEL Place 5 g onto the skin daily.    [provider]      I, Dr Seward Carol have personally reviewed patient's available data, including medical history, events of note, physical examination and test results as part of my evaluation. I have discussed with other care providers such as pharmacist, RN and Elink.  In addition,  I personally evaluated patient  The patient is critically ill with multiple organ systems failure and requires high complexity decision making for assessment and support, frequent evaluation and titration of therapies, application of advanced monitoring technologies and extensive interpretation of multiple databases.   Critical Care Time devoted to patient care services described in this note is 45 Minutes. This time reflects time of care of this signee Dr Seward Carol. This critical care time does not reflect procedure time, or teaching time or supervisory time but could involve care discussion time   Dr. Seward Carol Pulmonary Critical Care Medicine  05/12/2018 2:34 AM   Critical care time: 45 mins

## 2018-05-12 NOTE — Progress Notes (Signed)
Per Dr Genevive Bi, instructed RN to notify Nephrology of Na 117

## 2018-05-12 NOTE — Progress Notes (Addendum)
CRITICAL VALUE ALERT  Critical Value:  NA 117  Date & Time Notied:  05/12/18 1935  Provider Notified: Warren Lacy  Orders Received/Actions taken:  Awaiting Orders

## 2018-05-12 NOTE — Progress Notes (Signed)
CRITICAL VALUE ALERT  Critical Value:  Sodium=114  Date & Time Notied:  05/12/18 0057  Provider Notified: Shela Leff, MD  Orders Received/Actions taken: New orders placed in [facility] to be carried out.

## 2018-05-12 NOTE — Progress Notes (Signed)
  [facility] Progress Note    Assessment/ Plan:   1.  Severe hyponatremia:  In light of severity of hyponatremia recommended course of action would be hypertonic saline with goal of correction 32mEq in 1st 24 hours.  No matter etiology this will be the most effective way to safely deal with this degree of hyponatremia.  He had 3% started at 20 mL/ hr at 0442 this AM along with DDAVP, has risen 1 mEq.  Would like to see next Na check before increasing rate of 3% d/t risk of overcorrection.  I suspect combination of hypovolemic hyponatremia given poor PO intake and thiazide diuretic along with SIADH given inappropriately high urine osms.  Continue frequent Na checks- q2 for now.  TSH and AM cortisol are WNL.     2. Possible Covid 19: being ruled out.  CT at OSH concerning for viral PNA.   3.  N/V/D: CT benign at OSH.  Possibly part of viral syndrome.  Supportive care.   4.  poss h/o CKD: listed in chart, renal function here normal. CTM.  5.  HTN: currently normotensive without meds-- hold for now.    6.  Mildly elevated CK: ~1000.  Trend.  Not really severe enough to cause appreciable AKI.   Subjective:    Na rising slowly.  Started 3% at 0442 this AM, Na of 115 at 651 this AM.    Objective:   BP 131/73   Pulse 89   Temp 98.6 F (37 C) (Oral)   Resp 20   SpO2 98%   Intake/Output Summary (Last 24 hours) at 05/12/2018 9758 Last data filed at 05/12/2018 0700 Gross per 24 hour  Intake 327.71 ml  Output 600 ml  Net -272.29 ml   Weight change:   Physical Exam: Appears well, sitting in bed playing on phone Rest of exam limited by COVID-19 ruleout so relying on physical exam performed by PCCM team Imaging: No results found.  Labs: BMET Recent Labs  Lab 05/11/18 2313 05/12/18 0651  NA 114* 115*  K 3.8  --   CL 83*  --   CO2 21*  --   GLUCOSE 85  --   BUN 12  --   CREATININE 1.12  --   CALCIUM 8.2*  --    CBC Recent Labs  Lab 05/11/18 2313  WBC 5.8   HGB 13.8  HCT 37.8*  MCV 83.1  PLT 186    Medications:    . enoxaparin (LOVENOX) injection  40 mg Subcutaneous Q24H     Madelon Lips MD [facility] pgr [phone] 05/12/2018, 8:32 AM

## 2018-05-12 NOTE — Consult Note (Signed)
[facility]  INPATIENT CONSULTATION  Reason for Consultation: hyponatremia Requesting Provider: Dr. Marlowe Sax  HPI: Raymond Alvarez is a 63yo M with HTN, possible CKD, asthma, anxiety,  depression, gout, GERD who is evaluated in consultation for hyponatremia.   Pt presented to [facility] ED today for N/V/D for several days on background of 2wk h/o possible bronchitis with lingering cough not improved with azithromycin.  He had possible COVID exposure (coworking with + family member) and is being treated as a COVID rule out and was transferred to [facility] due to this.  CT at [facility] with findings that can be seen with Covid (see hospitalist H&P for details).   He reports nonbloody emesis, watery diarrhea for 3-4 days.  Unable to keep anything down.  Generalized weakness as a result.  No abd pain.  No fevers for 2 weeks.    At [facility] serum sodium noted to be 112.  He was given 2L NS.  On arrival here serum sodium 114.  Per report he is asymptomatic.  He has not been hypotensive or tachycardic.   Unable to clarify med list with him but his on file med list does include diuretics (lasix, HCTZ) and ACEi, ARB, indocin.  PMH: Past Medical History:  Diagnosis Date  . Anemia   . Anxiety   . Asthma   . Chronic kidney disease   . Colon polyp   . Depression   . Diverticulitis   . GERD (gastroesophageal reflux disease)   . Gout   . Hypertension   . Prostate cancer ([place])   . Seasonal allergies   . Wears glasses    PSH: Past Surgical History:  Procedure Laterality Date  . APPENDECTOMY  1970  . COLONOSCOPY  09/24/2016   Colonic polyp status post polypectomy. Tubular adenoma.   . ESOPHAGOGASTRODUODENOSCOPY  12/10/2016   Small hiatal hernia. Mild gastritis. Gastric polyps status post polypectomy x 4   . MASS EXCISION Left 12/15/2013   Procedure: EXCISION MUCOID TUMOR LEFT INDEX DEBRIDEMENT DISTAL INTERPHALANGEAL JOINT LEFT INDEX FINGER;  Surgeon: Daryll Brod, MD;  Location: [place];  Service: Orthopedics;  Laterality: Left;  . PROSTATECTOMY  2015  . TONSILLECTOMY      Past Medical History:  Diagnosis Date  . Anemia   . Anxiety   . Asthma   . Chronic kidney disease   . Colon polyp   . Depression   . Diverticulitis   . GERD (gastroesophageal reflux disease)   . Gout   . Hypertension   . Prostate cancer ([place])   . Seasonal allergies   . Wears glasses     Medications:  I have reviewed the patient's current medications.  Medications Prior to Admission  Medication Sig Dispense Refill  . allopurinol (ZYLOPRIM) 300 MG tablet Take 300 mg by mouth daily.    Marland Kitchen dicyclomine (BENTYL) 10 MG capsule Take 1 capsule (10 mg total) by mouth 4 (four) times daily -  before meals and at bedtime. 120 capsule 11  . hyoscyamine (LEVSIN SL) 0.125 MG SL tablet Place 1 tablet (0.125 mg total) under the tongue every 4 (four) hours as needed. 120 tablet 8  . indomethacin (INDOCIN) 25 MG capsule Take 25 mg by mouth daily.    Marland Kitchen lisinopril-hydrochlorothiazide (PRINZIDE,ZESTORETIC) 20-25 MG tablet Take 1 tablet by mouth daily.    Marland Kitchen omeprazole (PRILOSEC) 20 MG capsule Take 20 mg by mouth daily.    Marland Kitchen spironolactone (ALDACTONE) 25 MG tablet Take 1 tablet by mouth daily.    Marland Kitchen  vortioxetine HBr (TRINTELLIX) 20 MG TABS tablet Take 1 tablet by mouth daily.    Marland Kitchen albuterol (PROVENTIL HFA;VENTOLIN HFA) 108 (90 BASE) MCG/ACT inhaler Inhale into the lungs every 6 (six) hours as needed for wheezing or shortness of breath.    Marland Kitchen amLODipine-olmesartan (AZOR) 10-40 MG per tablet Take 1 tablet by mouth daily.    . diclofenac sodium (VOLTAREN) 1 % GEL Applied to knee every morning    . EPINEPHrine 0.3 mg/0.3 mL IJ SOAJ injection Inject into the muscle once. emergency    . fluticasone (FLONASE) 50 MCG/ACT nasal spray Place into both nostrils daily.    . furosemide (LASIX) 20 MG tablet Take 20 mg by mouth. Takes 1/2    . LORazepam (ATIVAN) 0.5 MG tablet Take 0.5 mg by mouth as needed.     .  Multiple Vitamins-Minerals (AIRBORNE PO) Take by mouth every morning.    . Olopatadine HCl (PAZEO) 0.7 % SOLN 1 drop in each eye daily    . Probiotic Product (PROBIOTIC-10 PO) Take 1 tablet by mouth daily.    Marland Kitchen testosterone (ANDROGEL) 50 MG/5GM (1%) GEL Place 5 g onto the skin daily.      ALLERGIES:   Allergies  Allergen Reactions  . Other Anaphylaxis    TREE NUTS  . Sulfa Antibiotics     FAM HX: Family History  Problem Relation Age of Onset  . Breast cancer Mother   . Prostate cancer Father   . Prostate cancer Maternal Grandfather     Social History:   reports that he has never smoked. He has never used smokeless tobacco. He reports current alcohol use. He reports that he does not use drugs.  ROS: did not obtain given COVID r/o  Blood pressure 123/78, pulse 88, temperature 98.6 F (37 C), temperature source Oral, resp. rate 16, SpO2 97 %. PHYSICAL EXAM: Given suspected/Covid r/o rely on hospitalist physical exam, no edema or volume overload.    Results for orders placed or performed during the hospital encounter of 05/11/18 (from the past 48 hour(s))  MRSA PCR Screening     Status: None   Collection Time: 05/11/18 10:11 PM  Result Value Ref Range   MRSA by PCR NEGATIVE NEGATIVE    Comment:        The GeneXpert MRSA Assay (FDA approved for NASAL specimens only), is one component of a comprehensive MRSA colonization surveillance program. It is not intended to diagnose MRSA infection nor to guide or monitor treatment for MRSA infections. Performed at [facility], [place] [address], [place], [place] [address]   CBC     Status: Abnormal   Collection Time: 05/11/18 11:13 PM  Result Value Ref Range   WBC 5.8 4.0 - 10.5 K/uL   RBC 4.55 4.22 - 5.81 MIL/uL   Hemoglobin 13.8 13.0 - 17.0 g/dL   HCT 37.8 (L) 39.0 - 52.0 %   MCV 83.1 80.0 - 100.0 fL   MCH 30.3 26.0 - 34.0 pg   MCHC 36.5 (H) 30.0 - 36.0 g/dL   RDW 12.6 11.5 - 15.5 %   Platelets 186 150 - 400 K/uL    nRBC 0.0 0.0 - 0.2 %    Comment: Performed at [facility], [place] [address], [place], [place] [address]  Comprehensive metabolic panel     Status: Abnormal   Collection Time: 05/11/18 11:13 PM  Result Value Ref Range   Sodium 114 (LL) 135 - 145 mmol/L    Comment: CRITICAL RESULT  CALLED TO, READ BACK BY AND VERIFIED WITH: BULLOCK,T RN 05/12/2018 0057 JORDANS    Potassium 3.8 3.5 - 5.1 mmol/L   Chloride 83 (L) 98 - 111 mmol/L   CO2 21 (L) 22 - 32 mmol/L   Glucose, Bld 85 70 - 99 mg/dL   BUN 12 6 - 20 mg/dL   Creatinine, Ser 1.12 0.61 - 1.24 mg/dL   Calcium 8.2 (L) 8.9 - 10.3 mg/dL   Total Protein 5.9 (L) 6.5 - 8.1 g/dL   Albumin 3.3 (L) 3.5 - 5.0 g/dL   AST 126 (H) 15 - 41 U/L   ALT 116 (H) 0 - 44 U/L   Alkaline Phosphatase 62 38 - 126 U/L   Total Bilirubin 0.7 0.3 - 1.2 mg/dL   GFR calc non Af Amer >60 >60 mL/min   GFR calc Af Amer >60 >60 mL/min   Anion gap 10 5 - 15    Comment: Performed at [facility], [place] [address] [place], [place] [address]  CK     Status: Abnormal   Collection Time: 05/12/18 12:35 AM  Result Value Ref Range   Total CK 826 (H) 49 - 397 U/L    Comment: Performed at [facility], [place] [address] [place], [place] [address]    No results found.  Assessment/Plan  **severe hyponatremia:  In light of severity of hyponatremia recommended course of action would be hypertonic saline with goal of correction 63mEq in 1st 24 hours.  No matter etiology this will be the most effective way to safely deal with this degree of hyponatremia.  As he is relatively asymptomatic, would hold bolus and start rate at 23mL/hr and titrate for effect based on q2h sodiums.  As this is likely hypovolemic hyponatremia he is a high risk for overcorrection once ADH is suppressed so recommend desmopressin 2mg  IV q6h for 1st 48h.    Will check urine and serum osm which I suspect will show high urine osm (> serum) and won't change management at this moment but may be helpful  as things progress.   Certainly with pulm pathology present could have [component of] SIADH.    Check TSH, cortisol but very low suspicion for these issues.   **Possible Covid 19: being ruled out.  CT at OSH concerning for viral PNA.   **N/V/D: CT benign at OSH.  Possibly part of viral syndrome.  Supportive care.   **poss h/o CKD: listed in chart, renal function here normal. CTM.  **HTN: currently normotensive without meds.    **Mildly elevated CK: ~1000.  Trend.  Not really severe enough to cause appreciable AKI.   Justin Mend 05/12/2018, 1:57 AM

## 2018-05-13 ENCOUNTER — Inpatient Hospital Stay (HOSPITAL_COMMUNITY): Payer: BC Managed Care – PPO

## 2018-05-13 DIAGNOSIS — D649 Anemia, unspecified: Secondary | ICD-10-CM

## 2018-05-13 DIAGNOSIS — R739 Hyperglycemia, unspecified: Secondary | ICD-10-CM

## 2018-05-13 LAB — CBC WITH DIFFERENTIAL/PLATELET
Abs Immature Granulocytes: 0.08 10*3/uL — ABNORMAL HIGH (ref 0.00–0.07)
Basophils Absolute: 0 10*3/uL (ref 0.0–0.1)
Basophils Relative: 0 %
Eosinophils Absolute: 0 10*3/uL (ref 0.0–0.5)
Eosinophils Relative: 1 %
HCT: 36.8 % — ABNORMAL LOW (ref 39.0–52.0)
Hemoglobin: 12.8 g/dL — ABNORMAL LOW (ref 13.0–17.0)
Immature Granulocytes: 1 %
Lymphocytes Relative: 12 %
Lymphs Abs: 0.9 10*3/uL (ref 0.7–4.0)
MCH: 29.6 pg (ref 26.0–34.0)
MCHC: 34.8 g/dL (ref 30.0–36.0)
MCV: 85.2 fL (ref 80.0–100.0)
Monocytes Absolute: 0.8 10*3/uL (ref 0.1–1.0)
Monocytes Relative: 11 %
Neutro Abs: 5.2 10*3/uL (ref 1.7–7.7)
Neutrophils Relative %: 75 %
Platelets: 200 10*3/uL (ref 150–400)
RBC: 4.32 MIL/uL (ref 4.22–5.81)
RDW: 12.9 % (ref 11.5–15.5)
WBC: 6.9 10*3/uL (ref 4.0–10.5)
nRBC: 0 % (ref 0.0–0.2)

## 2018-05-13 LAB — COMPREHENSIVE METABOLIC PANEL
ALT: 86 U/L — ABNORMAL HIGH (ref 0–44)
AST: 70 U/L — ABNORMAL HIGH (ref 15–41)
Albumin: 3.2 g/dL — ABNORMAL LOW (ref 3.5–5.0)
Alkaline Phosphatase: 57 U/L (ref 38–126)
Anion gap: 10 (ref 5–15)
BUN: 11 mg/dL (ref 6–20)
CO2: 22 mmol/L (ref 22–32)
Calcium: 8 mg/dL — ABNORMAL LOW (ref 8.9–10.3)
Chloride: 88 mmol/L — ABNORMAL LOW (ref 98–111)
Creatinine, Ser: 1.11 mg/dL (ref 0.61–1.24)
GFR calc Af Amer: >60 mL/min (ref >60)
GFR calc non Af Amer: >60 mL/min (ref >60)
Glucose, Bld: 106 mg/dL — ABNORMAL HIGH (ref 70–99)
Potassium: 3.9 mmol/L (ref 3.5–5.1)
Sodium: 120 mmol/L — ABNORMAL LOW (ref 135–145)
Total Bilirubin: 0.7 mg/dL (ref 0.3–1.2)
Total Protein: 5.9 g/dL — ABNORMAL LOW (ref 6.5–8.1)

## 2018-05-13 LAB — SODIUM
Sodium: 118 mmol/L — CL (ref 135–145)
Sodium: 119 mmol/L — CL (ref 135–145)
Sodium: 119 mmol/L — CL (ref 135–145)
Sodium: 119 mmol/L — CL (ref 135–145)
Sodium: 120 mmol/L — ABNORMAL LOW (ref 135–145)

## 2018-05-13 LAB — MAGNESIUM: Magnesium: 1.8 mg/dL (ref 1.7–2.4)

## 2018-05-13 LAB — PHOSPHORUS: Phosphorus: 2.7 mg/dL (ref 2.5–4.6)

## 2018-05-13 MED ORDER — SODIUM CHLORIDE 3 % IV SOLN
INTRAVENOUS | Status: DC
  Administered 2018-05-13 – 2018-05-14 (×2): 30 mL/h via INTRAVENOUS
  Filled 2018-05-13 (×3): qty 500

## 2018-05-13 MED ORDER — LORAZEPAM 1 MG PO TABS: 1.0000 mg | ORAL_TABLET | Freq: Every day | ORAL | Status: DC | PRN

## 2018-05-13 MED ORDER — OLOPATADINE HCL 0.1 % OP SOLN
1.0000 [drp] | Freq: Every day | OPHTHALMIC | Status: DC
  Administered 2018-05-13 – 2018-05-17 (×5): 1 [drp] via OPHTHALMIC
  Filled 2018-05-13: qty 5

## 2018-05-13 NOTE — Progress Notes (Signed)
CRITICAL VALUE ALERT  Critical Value:  NA 119  Date & Time Notied:  8403 05/13/18  Provider Notified: Order to not notify unless NA >120  Orders Received/Actions taken: None at this time

## 2018-05-13 NOTE — Progress Notes (Signed)
CRITICAL VALUE ALERT  Critical Value:  NA 118  Date & Time Notied:  05/13/18 0030  Provider Notified: Dr. Earnestine Leys  Orders Received/Actions taken:  No change in orders.  Only notify provider if NA >120

## 2018-05-13 NOTE — Progress Notes (Signed)
CRITICAL VALUE ALERT  Critical Value:  NA 119  Date & Time Notied:  05/13/18  0415  Provider Notified: Order to not to notify unless NA >120  Orders Received/Actions taken: None at this time

## 2018-05-13 NOTE — Progress Notes (Signed)
CRITICAL VALUE ALERT  Critical Value:  NA 117  Date & Time Notied:   05/12/18  2148  Provider Notified: Dr. Joelyn Oms  Orders Received/Actions taken:  No change in orders at this time.

## 2018-05-13 NOTE — Progress Notes (Signed)
CRITICAL VALUE ALERT  Critical Value:  Na 119  Date & Time Notied:  05/13/18 1616  Provider Notified: Do not notify unless >120  Orders Received/Actions taken:

## 2018-05-13 NOTE — Progress Notes (Signed)
  [facility] Progress Note    Assessment/ Plan:   1.  Severe hyponatremia:  On presentation was 112.  Now up to 119 in first 24 hrs. 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  3% hypertonic saline increased to 30 mL/ hr overnight- have ordered followup Na. DDAVP also being administered for first 48 hours.  TSH and AM cortisol are WNL.     2. Possible Covid 19: being ruled out.  CT at OSH concerning for viral PNA.   3.  N/V/D: CT benign at OSH.  Possibly part of viral syndrome.  Supportive care.  GI pathogen panel ordered  4.  poss h/o CKD: listed in chart, renal function here normal. CTM.  5.  HTN: currently normotensive without meds-- hold for now.    6.  Mildly elevated CK: ~1000.  Trend.  Not really severe enough to cause appreciable AKI.   Subjective:    Na rising slowly.  Increased to 30 mL/ hr overnight.   Objective:   BP (!) 135/92   Pulse 76   Temp 97.6 F (36.4 C) (Oral)   Resp 13   Wt 129.3 kg   SpO2 95%   BMI 36.60 kg/m   Intake/Output Summary (Last 24 hours) at 05/13/2018 5364 Last data filed at 05/13/2018 0600 Gross per 24 hour  Intake 747.56 ml  Output 600 ml  Net 147.56 ml   Weight change:   Physical Exam: Appears well, sitting in bed playing on phone Rest of exam limited by COVID-19 ruleout so relying on physical exam performed by PCCM team Imaging: No results found.  Labs: BMET Recent Labs  Lab 05/11/18 2313  05/12/18 1304 05/12/18 1701 05/12/18 1853 05/12/18 2118 05/12/18 2300 05/13/18 0116 05/13/18 0308  NA 114*   < > 117* 116* 117* 117* 118* 119* 119*  K 3.8  --   --   --   --   --   --   --   --   CL 83*  --   --   --   --   --   --   --   --   CO2 21*  --   --   --   --   --   --   --   --   GLUCOSE 85  --   --   --   --   --   --   --   --   BUN 12  --   --   --   --   --   --   --   --   CREATININE 1.12  --   --   --   --   --   --   --   --   CALCIUM  8.2*  --   --   --   --   --   --   --   --    < > = values in this interval not displayed.   CBC Recent Labs  Lab 05/11/18 2313  WBC 5.8  HGB 13.8  HCT 37.8*  MCV 83.1  PLT 186    Medications:    . enoxaparin (LOVENOX) injection  40 mg Subcutaneous Q24H     Madelon Lips MD [facility] pgr [phone] 05/13/2018, 8:07 AM

## 2018-05-13 NOTE — Progress Notes (Addendum)
PROGRESS NOTE    Raymond Alvarez  NOM:[id number] DOB: [birth year] DOA: 05/11/2018 PCP: Raina Mina., MD   Brief Narrative:  HPI per Dr. Shela Leff on 05/11/2018 The patient Raymond Alvarez is a 63 y.o. male with medical history significant of hypertension, CKD, asthma, anxiety, depression, gout, GERD presenting as a transfer from [facility] ED for evaluation of vomiting and diarrhea. Patient states 2 weeks ago he had bronchitis for which his PCP prescribed azithromycin. He has continued to cough since then.  Denies any shortness of breath.  Does report exposure to a coworker 2 weeks ago who had a family member positive for COVID-19.  States for the past 3 to 4 days he has been having nausea, NBNB emesis, and nonbloody diarrhea.  He has not been able to keep any food down and now continues to have dry heaves.  Reports having generalized weakness. Denies any abdominal pain. States he had a fever 2 weeks ago but no recent fevers.  Interim History: Nephrology and PCCM consulted and patient was admitted to the ICU. Currently now on 3% Hypertonic Saline at 30 mL/hr and Na+ is slowly improving and is 120.   Assessment & Plan:   Principal Problem:   Hyponatremia Active Problems:   Viral gastroenteritis   Suspected Covid-19 Virus Infection   Rhabdomyolysis   Pulmonary nodule  Severe Hyponatremia, improving slightly  -In the setting of GI loss from vomiting and diarrhea and likley Hypovolemic Hyponatremia but could have SIADH component and Nephrology suspects . Labs at [facility] showing sodium 112.   -Patient is asymptomatic.  No change in mental status or seizures.   -Patient received 2 L normal saline boluses in the ED.   -Repeat labs here showing Sodium slowly trending upwards -Discussed with Nephrology Dr. Hollie Salk.   -Recommendation is to start hypertonic saline infusion at 20 cc/hr and this was increased to 30 mL/hr overnight  -In addition, recommending IV Desmopressin 2 mg every 6 hours for a  total of 48 hours and this is to stop tomorrow.   -Goal sodium correction of 6 mEq in 24 hours.  -Neurochecks every 2 hours. Repeat BMP every 2 hours.  -Checked Urine Sodium and was 37 and Urine Osm was 344; Check serum osmolarity.   -Patient transferred to the ICU after admitting physician discussed with PCCM. -Continue Further Care per Nephrology  -Continue to Monitor Na+ Closely to avoid Overcorrection and continue Na+ q4h   Acute Viral Gastroenteritis, improved -Patient was presenting with complaints of nausea, vomiting, and diarrhea.   -Labs done at [facility] showing AST 168, ALT 155, alk phos 66, and T bili 0.6.   -Lipase normal.  -CT abdomen pelvis done at [facility] negative for acute finding but did show Hepatic steatosis.  Mildly complicated right liver cyst without change from the prior CT.  Scattered left colon diverticula.  No diverticulitis. -C/w IV fluid hydration delineated by Nephrology  -GI pathogen panel ordered and pending to be done  -C/w Loperamide 16m po PRN Diarrhea or Loose Stools -Zofran PRN Nausea/Vomiting  Concern for COVID-19 -Patient is presenting with a viral GI illness and cough.  Reports recent exposure to a family member of a positive case.   [facility] LDH 793, CRP 20, procalcitonin 0.17, Ferritin 555.  [facility] which can be seen with acute atypical infection, in particular, viral pneumonia including COVID-19.   -SARS-CoV-2 testing has been ordered and pending since 05/11/18 -C/w  Droplet and contact precautions for now -Respiratory viral panel Negative  -MRSA PCR was Negative  -Antitussives as needed for cough (Robitussin DM and Tussionex) and Albuterol Inhaler -Currently on Room Air, Not Leukopenic; Will Check CBC with Differential in AM  -Continue to Monitor for Desaturations and Respiratory Status  -Repeat CXR this AM showed stable pneumonia (Patchy bilateral  airspace disease as confirmed by CT.) and Low lung volumes. Borderline heart size accentuated by technique. Negative mediastinal contours.  Mild Rhabdomyolysis/Elevated CK -CK 1081 on labs done at [facility] on admission.   -Patient received 2 L normal saline boluses in the ED and is now on 3% of Hypertonic Saline at 30 mEQ and Nephrology following -Repeat CK yesterday AM was 826 -Careful IV fluid hydration to avoid rapid correction of hyponatremia -Repeat CK in AM   Pulmonary Nodule -CT chest done at [facility] showing a 7 mm left lower lobe nodule increased from 5 mm on the prior CT.   -Recommend follow-up CT chest in 6 months to reassess for stability versus further enlargement.  Hypertension -Currently Normotensive and on the lower side   -Continue to monitor blood pressure per Protocol -Last BP was 109/64  Asthma -Stable.  -Currently having No bronchospasm.  -C/w Guaifenesin-Dextromethorphan 10 mL po q4hprn Cough along with Chlorpheniramine-Hydrocodone 5 mL po q12h -C/w Albuterol Inhaler 1-2 puff IH q4hprn   Obesity -Estimated body mass index is 36.6 kg/m as calculated from the following:   Height as of 10/07/17: 6' 2" (1.88 m).   Weight as of this encounter: 129.3 kg. -Weight Loss and Dietary Counseling given  Abnormal LFTs/Elevated LFTs, trending down -Patient's AST was 126 and ALT was 116; ? Related to hepatic Steatosis but likley from Nausea and Vomiting -Trending down as AST is now 70 and ALT is 86 -Continue to Monitor and Trend and repeat CMP in AM -If worsening or not improving will check Acute Hepatitis Panel and RUQ U/S -Repeat CMP in AM   Normocytic Anemia -Patient's Hb/Hct went from 13.8/37.8 -> 12.8/36.8 -Check Anemia Panel in the AM -Continue to Monitor for S/Sx of Bleeding -Repeat CBC in AM   Hyperglycemia -Patient's Blood Glucose this AM was 106 -Check HbA1c in AM to r/o Diabetes -Continue to Monitor Blood Sugars carefully; If remain elevated will  place on Sensitive Novolog SSI AC  PPE worn by physician: Arvin Collard, Gloves, Shoe cover and hair bouffant.  DVT prophylaxis: Enoxaparin 40 mg sq q24h Code Status: FULL CODE Family Communication: No family present at bedside  Disposition Plan: Remain in the ICU for continued Monitoring as he is on Hypertonic Saline   Consultants:   PCCM/Pulmonary  Nephrology    Procedures: None   Antimicrobials:  Anti-infectives (From admission, onward)   None     Subjective: Seen and examined at bedside and states that he is doing ok.  Denies any chest pain, lightheadedness or dizziness.  No shortness of breath.  States he feels okay.  No other concerns or complaints at this time.  Objective: Vitals:   05/13/18 0900 05/13/18 1000 05/13/18 1100 05/13/18 1300  BP: 128/90 123/82 118/78 121/76  Pulse: 90 86 85 91  Resp: (!) _0 (!) 23  Temp: 98.2 F (36.8 C)     TempSrc: Oral     SpO2: 98% 98% 93% 98%  Weight:        Intake/Output Summary (Last 24 hours) at 05/13/2018 1422 Last data filed at 05/13/2018 1200 Gross per 24 hour  Intake 825.97 ml  Output 520 ml  Net 305.97 ml   Filed Weights   05/12/18 1500 05/13/18 0500  Weight: 129.3 kg 129.3 kg   Examination: Physical Exam:  Constitutional: Nourished, well-developed morbidly obese Caucasian male currently no acute distress appears calm and comfortable sitting in the chair bedside Eyes: Lids and conjunctive are normal.  Sclera anicteric. ENMT: External ears and nose appear normal.  Grossly normal hearing. Neck: Supple with no appreciable JVD Respiratory: Diminished auscultation bilaterally no appreciable wheezing, rales, rhonchi.  Patient not tachypneic or using accessory muscles to breathe Cardiovascular: Regular rate and rhythm.  Minimal lower pedal edema. Abdomen: Soft, nontender, distended second body habitus.  Bowel sounds present x4 GU: Deferred Musculoskeletal: No clubbing or cyanosis.  No joint deformities  noted Skin: No appreciable rashes or lesions on limited skin evaluation. Neurologic: Cranial nerves II through XII gross intact no appreciable focal deficits.  Romberg sign is cerebellar reflexes not assessed Psychiatric: Normal judgment and insight.  Patient is awake, alert and oriented x3.  Has a pleasant mood and affect  Data Reviewed: I have personally reviewed following labs and imaging studies  CBC: Recent Labs  Lab 05/11/18 2313 05/13/18 1119  WBC 5.8 6.9  NEUTROABS  --  5.2  HGB 13.8 12.8*  HCT 37.8* 36.8*  MCV 83.1 85.2  PLT 186 539   Basic Metabolic Panel: Recent Labs  Lab 05/11/18 2313  05/12/18 2118 05/12/18 2300 05/13/18 0116 05/13/18 0308 05/13/18 1119  NA 114*   < > 117* 118* 119* 119* 120*  K 3.8  --   --   --   --   --  3.9  CL 83*  --   --   --   --   --  88*  CO2 21*  --   --   --   --   --  22  GLUCOSE 85  --   --   --   --   --  106*  BUN 12  --   --   --   --   --  11  CREATININE 1.12  --   --   --   --   --  1.11  CALCIUM 8.2*  --   --   --   --   --  8.0*  MG  --   --   --   --   --   --  1.8  PHOS  --   --   --   --   --   --  2.7   < > = values in this interval not displayed.   GFR: CrCl cannot be calculated (Unknown ideal weight.). Liver Function Tests: Recent Labs  Lab 05/11/18 2313 05/13/18 1119  AST 126* 70*  ALT 116* 86*  ALKPHOS 62 57  BILITOT 0.7 0.7  PROT 5.9* 5.9*  ALBUMIN 3.3* 3.2*   No results for input(s): LIPASE, AMYLASE in the last 168 hours. No results for input(s): AMMONIA in the last 168 hours. Coagulation Profile: No results for input(s): INR, PROTIME in the last 168 hours. Cardiac Enzymes: Recent Labs  Lab 05/12/18 0035  CKTOTAL 826*   BNP (last 3 results) No results for input(s): PROBNP in the last 8760 hours. HbA1C: No results for input(s): HGBA1C in the last 72 hours. CBG: No results for input(s): GLUCAP in the last 168 hours. Lipid Profile: No results for input(s): CHOL, HDL, LDLCALC, TRIG,  CHOLHDL, LDLDIRECT in the last 72 hours. Thyroid Function Tests: Recent Labs  05/12/18 0651  TSH 0.939   Anemia Panel: No results for input(s): VITAMINB12, FOLATE, FERRITIN, TIBC, IRON, RETICCTPCT in the last 72 hours. Sepsis Labs: No results for input(s): PROCALCITON, LATICACIDVEN in the last 168 hours.  Recent Results (from the past 240 hour(s))  MRSA PCR Screening     Status: None   Collection Time: 05/11/18 10:11 PM  Result Value Ref Range Status   MRSA by PCR NEGATIVE NEGATIVE Final    Comment:        The GeneXpert MRSA Assay (FDA approved for NASAL specimens only), is one component of a comprehensive MRSA colonization surveillance program. It is not intended to diagnose MRSA infection nor to guide or monitor treatment for MRSA infections. Performed at [facility], [place] [address], [place], [place] [address]   Respiratory Panel by PCR     Status: None   Collection Time: 05/11/18 11:13 PM  Result Value Ref Range Status   Adenovirus NOT DETECTED NOT DETECTED Final   Coronavirus 229E NOT DETECTED NOT DETECTED Final    Comment: (NOTE) The Coronavirus on the Respiratory Panel, DOES NOT test for the novel  Coronavirus (2019 nCoV)    Coronavirus HKU1 NOT DETECTED NOT DETECTED Final   Coronavirus NL63 NOT DETECTED NOT DETECTED Final   Coronavirus OC43 NOT DETECTED NOT DETECTED Final   Metapneumovirus NOT DETECTED NOT DETECTED Final   Rhinovirus / Enterovirus NOT DETECTED NOT DETECTED Final   Influenza A NOT DETECTED NOT DETECTED Final   Influenza B NOT DETECTED NOT DETECTED Final   Parainfluenza Virus 1 NOT DETECTED NOT DETECTED Final   Parainfluenza Virus 2 NOT DETECTED NOT DETECTED Final   Parainfluenza Virus 3 NOT DETECTED NOT DETECTED Final   Parainfluenza Virus 4 NOT DETECTED NOT DETECTED Final   Respiratory Syncytial Virus NOT DETECTED NOT DETECTED Final   Bordetella pertussis NOT DETECTED NOT DETECTED Final   Chlamydophila pneumoniae NOT DETECTED NOT  DETECTED Final   Mycoplasma pneumoniae NOT DETECTED NOT DETECTED Final    Comment: Performed at [facility], [place]. [address], [place],  [address]    Radiology Studies: Dg Chest [place] 1 View  Result Date: 05/13/2018 CLINICAL DATA:  Shortness of breath EXAM: PORTABLE CHEST 1 VIEW COMPARISON:  Two days ago FINDINGS: Patchy bilateral airspace disease as confirmed by CT. Low lung volumes. Borderline heart size accentuated by technique. Negative mediastinal contours. IMPRESSION: Stable pneumonia and low lung volumes. Electronically Signed   By: Monte Fantasia M.D.   On: 05/13/2018 08:09    Scheduled Meds: . enoxaparin (LOVENOX) injection  40 mg Subcutaneous Q24H  . olopatadine  1 drop Both Eyes Daily   Continuous Infusions: . desmopressin (DDAVP) IV Stopped (05/13/18 1122)  . sodium chloride (hypertonic) 30 mL/hr at 05/13/18 1200    LOS: 2 days   Kerney Elbe, DO [facility] PAGER is on AMION  If 7PM-7AM, please contact night-coverage [email or web address] Password [email or web address] 05/13/2018, 2:22 PM

## 2018-05-14 LAB — CK: Total CK: 180 U/L (ref 49–397)

## 2018-05-14 LAB — COMPREHENSIVE METABOLIC PANEL
ALT: 70 U/L — ABNORMAL HIGH (ref 0–44)
AST: 48 U/L — ABNORMAL HIGH (ref 15–41)
Albumin: 2.9 g/dL — ABNORMAL LOW (ref 3.5–5.0)
Alkaline Phosphatase: 52 U/L (ref 38–126)
Anion gap: 8 (ref 5–15)
BUN: 10 mg/dL (ref 6–20)
CO2: 22 mmol/L (ref 22–32)
Calcium: 7.9 mg/dL — ABNORMAL LOW (ref 8.9–10.3)
Chloride: 91 mmol/L — ABNORMAL LOW (ref 98–111)
Creatinine, Ser: 1.13 mg/dL (ref 0.61–1.24)
GFR calc Af Amer: >60 mL/min (ref >60)
GFR calc non Af Amer: >60 mL/min (ref >60)
Glucose, Bld: 91 mg/dL (ref 70–99)
Potassium: 4 mmol/L (ref 3.5–5.1)
Sodium: 121 mmol/L — ABNORMAL LOW (ref 135–145)
Total Bilirubin: 0.6 mg/dL (ref 0.3–1.2)
Total Protein: 5.3 g/dL — ABNORMAL LOW (ref 6.5–8.1)

## 2018-05-14 LAB — CBC WITH DIFFERENTIAL/PLATELET
Abs Immature Granulocytes: 0.09 10*3/uL — ABNORMAL HIGH (ref 0.00–0.07)
Basophils Absolute: 0 10*3/uL (ref 0.0–0.1)
Basophils Relative: 0 %
Eosinophils Absolute: 0.1 10*3/uL (ref 0.0–0.5)
Eosinophils Relative: 1 %
HCT: 34.1 % — ABNORMAL LOW (ref 39.0–52.0)
Hemoglobin: 11.8 g/dL — ABNORMAL LOW (ref 13.0–17.0)
Immature Granulocytes: 1 %
Lymphocytes Relative: 17 %
Lymphs Abs: 1.1 10*3/uL (ref 0.7–4.0)
MCH: 29.9 pg (ref 26.0–34.0)
MCHC: 34.6 g/dL (ref 30.0–36.0)
MCV: 86.5 fL (ref 80.0–100.0)
Monocytes Absolute: 0.8 10*3/uL (ref 0.1–1.0)
Monocytes Relative: 13 %
Neutro Abs: 4.2 10*3/uL (ref 1.7–7.7)
Neutrophils Relative %: 68 %
Platelets: 192 10*3/uL (ref 150–400)
RBC: 3.94 MIL/uL — ABNORMAL LOW (ref 4.22–5.81)
RDW: 13.2 % (ref 11.5–15.5)
WBC: 6.3 10*3/uL (ref 4.0–10.5)
nRBC: 0 % (ref 0.0–0.2)

## 2018-05-14 LAB — RETICULOCYTES
Immature Retic Fract: 6.5 % (ref 2.3–15.9)
RBC.: 3.94 MIL/uL — ABNORMAL LOW (ref 4.22–5.81)
Retic Count, Absolute: 31.5 10*3/uL (ref 19.0–186.0)
Retic Ct Pct: 0.8 % (ref 0.4–3.1)

## 2018-05-14 LAB — FOLATE: Folate: 8.8 ng/mL (ref >5.9)

## 2018-05-14 LAB — SODIUM
Sodium: 123 mmol/L — ABNORMAL LOW (ref 135–145)
Sodium: 122 mmol/L — ABNORMAL LOW (ref 135–145)
Sodium: 120 mmol/L — ABNORMAL LOW (ref 135–145)
Sodium: 123 mmol/L — ABNORMAL LOW (ref 135–145)

## 2018-05-14 LAB — VITAMIN B12: Vitamin B-12: 1060 pg/mL — ABNORMAL HIGH (ref 180–914)

## 2018-05-14 LAB — HEMOGLOBIN A1C
Hgb A1c MFr Bld: 6.6 % — ABNORMAL HIGH (ref 4.8–5.6)
Mean Plasma Glucose: 142.72 mg/dL

## 2018-05-14 LAB — IRON AND TIBC
Iron: 51 ug/dL (ref 45–182)
Saturation Ratios: 16 % — ABNORMAL LOW (ref 17.9–39.5)
TIBC: 316 ug/dL (ref 250–450)
UIBC: 265 ug/dL

## 2018-05-14 LAB — NOVEL CORONAVIRUS, NAA (HOSP ORDER, SEND-OUT TO REF LAB; TAT 18-24 HRS): SARS-CoV-2, NAA: DETECTED — AB

## 2018-05-14 LAB — PHOSPHORUS: Phosphorus: 2.7 mg/dL (ref 2.5–4.6)

## 2018-05-14 LAB — FERRITIN: Ferritin: 332 ng/mL (ref 24–336)

## 2018-05-14 LAB — MAGNESIUM: Magnesium: 1.8 mg/dL (ref 1.7–2.4)

## 2018-05-14 MED ORDER — FUROSEMIDE 10 MG/ML IJ SOLN
40.0000 mg | Freq: Two times a day (BID) | INTRAMUSCULAR | Status: DC
  Administered 2018-05-14 – 2018-05-16 (×4): 40 mg via INTRAVENOUS
  Filled 2018-05-14 (×4): qty 4

## 2018-05-14 MED ORDER — GUAIFENESIN ER 600 MG PO TB12
1200.0000 mg | ORAL_TABLET | Freq: Two times a day (BID) | ORAL | Status: DC
  Administered 2018-05-14 – 2018-05-17 (×7): 1200 mg via ORAL
  Filled 2018-05-14 (×7): qty 2

## 2018-05-14 NOTE — Plan of Care (Signed)
  Problem: Education: Goal: Knowledge of General Education information will improve Description Including pain rating scale, medication(s)/side effects and non-pharmacologic comfort measures Outcome: Progressing   Problem: Health Behavior/Discharge Planning: Goal: Ability to manage health-related needs will improve Outcome: Progressing   Problem: Clinical Measurements: Goal: Ability to maintain clinical measurements within normal limits will improve Outcome: Progressing Goal: Will remain free from infection Outcome: Progressing Goal: Diagnostic test results will improve Outcome: Progressing Goal: Respiratory complications will improve Outcome: Progressing Goal: Cardiovascular complication will be avoided Outcome: Progressing   Problem: Activity: Goal: Risk for activity intolerance will decrease Outcome: Progressing   Problem: Nutrition: Goal: Adequate nutrition will be maintained Outcome: Progressing   Problem: Coping: Goal: Level of anxiety will decrease Outcome: Progressing   Problem: Elimination: Goal: Will not experience complications related to bowel motility Outcome: Progressing Goal: Will not experience complications related to urinary retention Outcome: Progressing   Problem: Pain Managment: Goal: General experience of comfort will improve Outcome: Progressing   Problem: Safety: Goal: Ability to remain free from injury will improve Outcome: Progressing   Problem: Skin Integrity: Goal: Risk for impaired skin integrity will decrease Outcome: Progressing  Patient is a/o x4. Na+ is normalizing, now 123. Will stop 3% saline gtt at 1400. TED started. COVID +.

## 2018-05-14 NOTE — Progress Notes (Signed)
[facility] Progress Note    Assessment/ Plan:   1.  Severe hyponatremia:  On presentation was 112.  Now up to 121 in first 48hrs. 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  3% hypertonic saline at 30 mL/ hr.  S/p DDAVP for the first 48 hrs to ensure no overcorrection.  TSH and AM cortisol are WNL.  Would continue 3% for at least a few more hours now.  Expect a slightly faster correction of Na now that no longer administering DDAVP.  Have re-ordered Na checks, OK for q 4 hrs.  If Na rising on next checks, could stop 3%.  Would like to see another value at least > 121 (previous check) before stopping    2. Possible Covid 19: being ruled out.  CT at OSH concerning for viral PNA--> CXR yesterday 4/9 showed stable findings.    3.  N/V/D: CT benign at OSH.  Possibly part of viral syndrome.  Supportive care.  GI pathogen panel ordered  4.  poss h/o CKD: listed in chart, renal function here normal. CTM.  5.  HTN: currently normotensive without meds-- hold for now.    6.  Mildly elevated CK: ~1000.  Trend.  Not really severe enough to cause appreciable AKI.   Subjective:    Na above 120, off DDAVP.   Objective:   BP (!) 135/91   Pulse 78   Temp 98.5 F (36.9 C) (Oral)   Resp 18   Wt 126.7 kg   SpO2 99%   BMI 35.86 kg/m   Intake/Output Summary (Last 24 hours) at 05/14/2018 0815 Last data filed at 05/14/2018 0600 Gross per 24 hour  Intake 803.76 ml  Output 422 ml  Net 381.76 ml   Weight change: -2.6 kg  Physical Exam: Sitting up in bed, games on phone again.   Rest of exam limited by COVID-19 ruleout so relying on physical exam performed by primary team Imaging: Dg Chest Port 1 View  Result Date: 05/13/2018 CLINICAL DATA:  Shortness of breath EXAM: PORTABLE CHEST 1 VIEW COMPARISON:  Two days ago FINDINGS: Patchy bilateral airspace disease as confirmed by CT. Low lung volumes. Borderline heart size  accentuated by technique. Negative mediastinal contours. IMPRESSION: Stable pneumonia and low lung volumes. Electronically Signed   By: Monte Fantasia M.D.   On: 05/13/2018 08:09    Labs: BMET Recent Labs  Lab 05/11/18 2313  05/12/18 2300 05/13/18 0116 05/13/18 0308 05/13/18 1119 05/13/18 1455 05/13/18 2100 05/14/18 0505  NA 114*   < > 118* 119* 119* 120* 119* 120* 121*  K 3.8  --   --   --   --  3.9  --   --  4.0  CL 83*  --   --   --   --  88*  --   --  91*  CO2 21*  --   --   --   --  22  --   --  22  GLUCOSE 85  --   --   --   --  106*  --   --  91  BUN 12  --   --   --   --  11  --   --  10  CREATININE 1.12  --   --   --   --  1.11  --   --  1.13  CALCIUM 8.2*  --   --   --   --  8.0*  --   --  7.9*  PHOS  --   --   --   --   --  2.7  --   --  2.7   < > = values in this interval not displayed.   CBC Recent Labs  Lab 05/11/18 2313 05/13/18 1119 05/14/18 0505  WBC 5.8 6.9 6.3  NEUTROABS  --  5.2 4.2  HGB 13.8 12.8* 11.8*  HCT 37.8* 36.8* 34.1*  MCV 83.1 85.2 86.5  PLT 186 200 192    Medications:    . enoxaparin (LOVENOX) injection  40 mg Subcutaneous Q24H  . olopatadine  1 drop Both Eyes Daily     Madelon Lips MD [facility] pgr [phone] 05/14/2018, 8:15 AM

## 2018-05-14 NOTE — Progress Notes (Signed)
PROGRESS NOTE    Raymond Alvarez  DSK:[id number] DOB: 05/05/1955 DOA: 05/11/2018 PCP: Raina Mina., MD   Brief Narrative:  HPI per Dr. Shela Leff on 05/11/2018 The patient Raymond Alvarez is a 63 y.o. male with medical history significant of hypertension, CKD, asthma, anxiety, depression, gout, GERD presenting as a transfer from [facility] ED for evaluation of vomiting and diarrhea. Patient states 2 weeks ago he had bronchitis for which his PCP prescribed azithromycin. He has continued to cough since then.  Denies any shortness of breath.  Does report exposure to a coworker 2 weeks ago who had a family member positive for COVID-19.  States for the past 3 to 4 days he has been having nausea, NBNB emesis, and nonbloody diarrhea.  He has not been able to keep any food down and now continues to have dry heaves.  Reports having generalized weakness. Denies any abdominal pain. States he had a fever 2 weeks ago but no recent fevers.  Interim History: Nephrology and PCCM consulted and patient was admitted to the ICU. Currently now on 3% Hypertonic Saline at 30 mL/hr and Na+ is slowly improving and is 121 this AM and improved to 123.   Assessment & Plan:   Principal Problem:   Hyponatremia Active Problems:   Viral gastroenteritis   Suspected Covid-19 Virus Infection   Rhabdomyolysis   Pulmonary nodule  Severe Hyponatremia, improving slightly  -In the setting of GI loss from vomiting and diarrhea and likley Hypovolemic Hyponatremia but could have SIADH component and Nephrology suspects . Labs at [facility] showing sodium 112.   -Patient is asymptomatic.  No change in mental status or seizures.   -Patient received 2 L normal saline boluses in the ED.   -Repeat labs here showing Sodium slowly trending upwards and is 123 -Recommendation was to start hypertonic saline infusion at 20 cc/hr and this was increased to 30 mL/hr overnight on 4/8-4/9 and Dr. Hollie Salk recommending continuing 3% for at least a few  more hours now and she expects a slightly faster correction of Na+ with DDAVP now stopped  -In addition, recommending IV Desmopressin 2 mg every 6 hours for a total of 48 hours and this has been completed and stopped   -Goal sodium correction of 6 mEq in 24 hours.  -Neurochecks every 2 hours. Repeat BMP every 4 hours now stopped  -Checked Urine Sodium and was 37 and Urine Osm was 344; Check serum osmolarity.   -Patient transferred to the ICU after admitting physician discussed with PCCM. -Continue Further Care per Nephrology  -Continue to Monitor Na+ Closely to avoid Overcorrection and continue Na+ q4h; If Na+ is rising the next few checks we could stop 3% Saline and she would like to see another value at least >121 before stopping 3% Hypertonic Saline  Acute Viral Gastroenteritis, improved -Patient was presenting with complaints of nausea, vomiting, and diarrhea.   -Labs done at [facility] showing AST 168, ALT 155, alk phos 66, and T bili 0.6; LFTs are improving   -Lipase normal.  -CT abdomen pelvis done at [facility] negative for acute finding but did show Hepatic steatosis.  Mildly complicated right liver cyst without change from the prior CT.  Scattered left colon diverticula.  No diverticulitis. -C/w IV fluid hydration delineated by Nephrology  -GI pathogen panel ordered and pending to be done  -C/w Loperamide 40m po PRN Diarrhea or Loose Stools -Zofran PRN Nausea/Vomiting   COVID-19 Disease -Patient is presented with a viral GI illness and cough.  Reports  recent exposure to a family member of a positive case (Son).   -Labs at [facility] showing lymphopenia. LDH 793, CRP 20, procalcitonin 0.17, Ferritin 555.  -Chest CT done at [facility] showing a spectrum of findings in the lungs which can be seen with acute atypical infection, in particular, viral pneumonia including COVID-19.   -SARS-CoV-2 testing has been ordered and pending since 05/11/18 at our hospital but at [facility] he tested Positive  -C/w Droplet and contact precautions for now -Respiratory Viral Panel Negative  -MRSA PCR was Negative  -Antitussives as needed for cough (Robitussin DM changed to po Guaifenesin 1200 mg po BID and Tussionex) and Albuterol Inhaler -Currently on Room Air, Not Leukopenic now an; Will Check CBC with Differential in AM  -Continue to Monitor for Desaturations and Respiratory Status  -Repeat CXR yesterday AM showed stable pneumonia (Patchy bilateral airspace disease as confirmed by CT.) and Low lung volumes. Borderline heart size accentuated by technique. Negative mediastinal contours.  Mild Rhabdomyolysis/Elevated CK -CK 1081 on labs done at [facility] on admission.   -Patient received 2 L normal saline boluses in the ED and is now on 3% of Hypertonic Saline at 30 mEQ and Nephrology following -Repeat CK the day before yesterday was 826 and is now 180 -Careful IV fluid hydration to avoid rapid correction of hyponatremia -Will not Repeat CK in AM   Pulmonary Nodule -CT chest done at [facility] showing a 7 mm left lower lobe nodule increased from 5 mm on the prior CT.   -Recommend follow-up CT chest in 6 months to reassess for stability versus further enlargement.  Hypertension -Currently Normotensive and on the lower side   -Continue to monitor blood pressure per Protocol -Last BP was 136/95  Asthma -Stable.  -Currently having No bronchospasm.  -Stopped Guaifenesin-Dextromethorphan 10 mL po q4hprn and started Guaifenesin 1200 mg po BID for Cough along with Chlorpheniramine-Hydrocodone 5 mL po q12h -C/w Albuterol Inhaler 1-2 puff IH q4hprn   Obesity -Estimated body mass index is 35.86 kg/m as calculated from the following:   Height as of 10/07/17: 6' 2" (1.88 m).   Weight as of this encounter: 126.7 kg. -Weight Loss and Dietary Counseling given  Abnormal LFTs/Elevated LFTs, trending down -Patient's AST was 126 and ALT was 116; ? Related to hepatic Steatosis but likley from Nausea and  Vomiting -Trending down as AST is now 48 and ALT is 70 -Continue to Monitor and Trend and repeat CMP in AM -If worsening or not improving will check Acute Hepatitis Panel and RUQ U/S -Repeat CMP in AM   Normocytic Anemia -Patient's Hb/Hct went from 13.8/37.8 -> 12.8/36.8 -> 11.8/34.1 -Checked Anemia Panel and showed an iron level of 51, U IBC of 265, TIBC of 316, saturation ratios of 16%, ferritin level 332, folate level of 8.8, and vitamin B12 of 1060 -Continue to Monitor for S/Sx of Bleeding -Repeat CBC in AM   Hyperglycemia in the setting of New Onset Diabetes Mellitus Type 2 -Patient's Blood Glucose this AM was 91 but has been ranging from 91-106 -Checked HbA1c in AM to r/o Diabetes and patient is Diabetic as HbA1c was 6.6 -Continue to Monitor Blood Sugars carefully; If remain elevated will place on Sensitive Novolog SSI AC  PPE worn by physician: Arvin Collard, Gloves, Shoe cover and hair bouffant.  DVT prophylaxis: Enoxaparin 40 mg sq q24h Code Status: FULL CODE Family Communication: No family present at bedside  Disposition Plan: Remain in the ICU for continued Monitoring as he is on Hypertonic Saline  and transfer to Medical Floor when he he is off Hypertonic Saline Nebs  Consultants:   PCCM/Pulmonary  Nephrology    Procedures: None   Antimicrobials:  Anti-infectives (From admission, onward)   None     Subjective: Seen and examined at bedside was complaining of some lower leg swelling.  Denies chest pain, lightheadedness or dizziness.  No nausea or vomiting.  States that he does have a cough and is coughing up some sputum but does not know what color it is.  No other concerns or complaints at this time.  Objective: Vitals:   05/14/18 0300 05/14/18 0400 05/14/18 0500 05/14/18 0625  BP: 128/86 119/67 (!) 138/94 (!) 135/91  Pulse: 78 72 83 78  Resp: 18 13 (!) 28 18  Temp:      TempSrc:      SpO2: 100% 96% 93% 99%  Weight:   126.7 kg     Intake/Output  Summary (Last 24 hours) at 05/14/2018 0752 Last data filed at 05/14/2018 0600 Gross per 24 hour  Intake 833.76 ml  Output 422 ml  Net 411.76 ml   Filed Weights   05/12/18 1500 05/13/18 0500 05/14/18 0500  Weight: 129.3 kg 129.3 kg 126.7 kg   Examination: Physical Exam:  Constitutional: Nourished, well-developed morbidly obese Caucasian male currently no acute distress appears calm and comfortable sitting up in bed resting. Eyes: Conjunctive are normal.  Sclera anicteric ENMT: Ears and nose appear normal.  Grossly normal hearing Neck: Supple with no appreciable JVD Respiratory: Diminished to auscultation bilaterally with no appreciable wheezing, rales, rhonchi.  He is not tachypneic or using accessory muscles to breathe and does have a dry cough.  Has unlabored breathing and is not wearing any supplemental oxygen via nasal cannula Cardiovascular: Rate and rhythm and rhythm.  Has some lower extremity edema. Abdomen:, Nontender, distended second body mass.  Bowel sounds present GU: Deferred Musculoskeletal: No clubbing or cyanosis.  No joint deformity noted. Skin: No appreciable rashes or lesions on to skin evaluation Neurologic: Cranial nerves II through XII grossly diagnosable focal deficits.  Romberg sign and cerebellar reflexes are not assessed Psychiatric: Normal Judgment insight.  Patient is awake, alert, oriented x3.  Has a pleasant mood and affect.  Data Reviewed: I have personally reviewed following labs and imaging studies  CBC: Recent Labs  Lab 05/11/18 2313 05/13/18 1119 05/14/18 0505  WBC 5.8 6.9 6.3  NEUTROABS  --  5.2 4.2  HGB 13.8 12.8* 11.8*  HCT 37.8* 36.8* 34.1*  MCV 83.1 85.2 86.5  PLT 186 200 920   Basic Metabolic Panel: Recent Labs  Lab 05/11/18 2313  05/13/18 0308 05/13/18 1119 05/13/18 1455 05/13/18 2100 05/14/18 0505  NA 114*   < > 119* 120* 119* 120* 121*  K 3.8  --   --  3.9  --   --  4.0  CL 83*  --   --  88*  --   --  91*  CO2 21*  --   --   22  --   --  22  GLUCOSE 85  --   --  106*  --   --  91  BUN 12  --   --  11  --   --  10  CREATININE 1.12  --   --  1.11  --   --  1.13  CALCIUM 8.2*  --   --  8.0*  --   --  7.9*  MG  --   --   --  1.8  --   --  1.8  PHOS  --   --   --  2.7  --   --  2.7   < > = values in this interval not displayed.   GFR: CrCl cannot be calculated (Unknown ideal weight.). Liver Function Tests: Recent Labs  Lab 05/11/18 2313 05/13/18 1119 05/14/18 0505  AST 126* 70* 48*  ALT 116* 86* 70*  ALKPHOS 62 57 52  BILITOT 0.7 0.7 0.6  PROT 5.9* 5.9* 5.3*  ALBUMIN 3.3* 3.2* 2.9*   No results for input(s): LIPASE, AMYLASE in the last 168 hours. No results for input(s): AMMONIA in the last 168 hours. Coagulation Profile: No results for input(s): INR, PROTIME in the last 168 hours. Cardiac Enzymes: Recent Labs  Lab 05/12/18 0035 05/14/18 0505  CKTOTAL 826* 180   BNP (last 3 results) No results for input(s): PROBNP in the last 8760 hours. HbA1C: Recent Labs    05/14/18 0505  HGBA1C 6.6*   CBG: No results for input(s): GLUCAP in the last 168 hours. Lipid Profile: No results for input(s): CHOL, HDL, LDLCALC, TRIG, CHOLHDL, LDLDIRECT in the last 72 hours. Thyroid Function Tests: Recent Labs    05/12/18 0651  TSH 0.939   Anemia Panel: Recent Labs    05/14/18 0505  VITAMINB12 1,060*  FOLATE 8.8  FERRITIN 332  TIBC 316  IRON 51  RETICCTPCT 0.8   Sepsis Labs: No results for input(s): PROCALCITON, LATICACIDVEN in the last 168 hours.  Recent Results (from the past 240 hour(s))  MRSA PCR Screening     Status: None   Collection Time: 05/11/18 10:11 PM  Result Value Ref Range Status   MRSA by PCR NEGATIVE NEGATIVE Final    Comment:        The GeneXpert MRSA Assay (FDA approved for NASAL specimens only), is one component of a comprehensive MRSA colonization surveillance program. It is not intended to diagnose MRSA infection nor to guide or monitor treatment for MRSA  infections. Performed at [facility], [place] [address] [place], [place] [address]   Respiratory Panel by PCR     Status: None   Collection Time: 05/11/18 11:13 PM  Result Value Ref Range Status   Adenovirus NOT DETECTED NOT DETECTED Final   Coronavirus 229E NOT DETECTED NOT DETECTED Final    Comment: (NOTE) The Coronavirus on the Respiratory Panel, DOES NOT test for the novel  Coronavirus (2019 nCoV)    Coronavirus HKU1 NOT DETECTED NOT DETECTED Final   Coronavirus NL63 NOT DETECTED NOT DETECTED Final   Coronavirus OC43 NOT DETECTED NOT DETECTED Final   Metapneumovirus NOT DETECTED NOT DETECTED Final   Rhinovirus / Enterovirus NOT DETECTED NOT DETECTED Final   Influenza A NOT DETECTED NOT DETECTED Final   Influenza B NOT DETECTED NOT DETECTED Final   Parainfluenza Virus 1 NOT DETECTED NOT DETECTED Final   Parainfluenza Virus 2 NOT DETECTED NOT DETECTED Final   Parainfluenza Virus 3 NOT DETECTED NOT DETECTED Final   Parainfluenza Virus 4 NOT DETECTED NOT DETECTED Final   Respiratory Syncytial Virus NOT DETECTED NOT DETECTED Final   Bordetella pertussis NOT DETECTED NOT DETECTED Final   Chlamydophila pneumoniae NOT DETECTED NOT DETECTED Final   Mycoplasma pneumoniae NOT DETECTED NOT DETECTED Final    Comment: Performed at [facility], [place]. [address] [place], [place] [address]    Radiology Studies: Dg Chest Port 1 View  Result Date: 05/13/2018 CLINICAL DATA:  Shortness of breath EXAM: PORTABLE CHEST  1 VIEW COMPARISON:  Two days ago FINDINGS: Patchy bilateral airspace disease as confirmed by CT. Low lung volumes. Borderline heart size accentuated by technique. Negative mediastinal contours. IMPRESSION: Stable pneumonia and low lung volumes. Electronically Signed   By: Monte Fantasia M.D.   On: 05/13/2018 08:09    Scheduled Meds: . enoxaparin (LOVENOX) injection  40 mg Subcutaneous Q24H  . olopatadine  1 drop Both Eyes Daily   Continuous Infusions: . sodium chloride  (hypertonic) 30 mL/hr at 05/14/18 0600    LOS: 3 days   Kerney Elbe, DO [facility] PAGER is on AMION  If 7PM-7AM, please contact night-coverage [email or web address] Password [facility] 05/14/2018, 7:52 AM

## 2018-05-15 ENCOUNTER — Inpatient Hospital Stay (HOSPITAL_COMMUNITY): Payer: BC Managed Care – PPO

## 2018-05-15 DIAGNOSIS — J1289 Other viral pneumonia: Secondary | ICD-10-CM

## 2018-05-15 DIAGNOSIS — Z7189 Other specified counseling: Secondary | ICD-10-CM

## 2018-05-15 LAB — COMPREHENSIVE METABOLIC PANEL
ALT: 66 U/L — ABNORMAL HIGH (ref 0–44)
AST: 42 U/L — ABNORMAL HIGH (ref 15–41)
Albumin: 2.9 g/dL — ABNORMAL LOW (ref 3.5–5.0)
Alkaline Phosphatase: 50 U/L (ref 38–126)
Anion gap: 12 (ref 5–15)
BUN: 10 mg/dL (ref 6–20)
CO2: 21 mmol/L — ABNORMAL LOW (ref 22–32)
Calcium: 8.5 mg/dL — ABNORMAL LOW (ref 8.9–10.3)
Chloride: 91 mmol/L — ABNORMAL LOW (ref 98–111)
Creatinine, Ser: 1.06 mg/dL (ref 0.61–1.24)
GFR calc Af Amer: >60 mL/min (ref >60)
GFR calc non Af Amer: >60 mL/min (ref >60)
Glucose, Bld: 93 mg/dL (ref 70–99)
Potassium: 3.7 mmol/L (ref 3.5–5.1)
Sodium: 124 mmol/L — ABNORMAL LOW (ref 135–145)
Total Bilirubin: 0.5 mg/dL (ref 0.3–1.2)
Total Protein: 5.3 g/dL — ABNORMAL LOW (ref 6.5–8.1)

## 2018-05-15 LAB — MAGNESIUM: Magnesium: 1.7 mg/dL (ref 1.7–2.4)

## 2018-05-15 LAB — CBC WITH DIFFERENTIAL/PLATELET
Abs Immature Granulocytes: 0.11 10*3/uL — ABNORMAL HIGH (ref 0.00–0.07)
Basophils Absolute: 0 10*3/uL (ref 0.0–0.1)
Basophils Relative: 0 %
Eosinophils Absolute: 0.1 10*3/uL (ref 0.0–0.5)
Eosinophils Relative: 2 %
HCT: 36 % — ABNORMAL LOW (ref 39.0–52.0)
Hemoglobin: 12.8 g/dL — ABNORMAL LOW (ref 13.0–17.0)
Immature Granulocytes: 2 %
Lymphocytes Relative: 19 %
Lymphs Abs: 1.3 10*3/uL (ref 0.7–4.0)
MCH: 30.2 pg (ref 26.0–34.0)
MCHC: 35.6 g/dL (ref 30.0–36.0)
MCV: 84.9 fL (ref 80.0–100.0)
Monocytes Absolute: 0.7 10*3/uL (ref 0.1–1.0)
Monocytes Relative: 11 %
Neutro Abs: 4.2 10*3/uL (ref 1.7–7.7)
Neutrophils Relative %: 66 %
Platelets: 215 10*3/uL (ref 150–400)
RBC: 4.24 MIL/uL (ref 4.22–5.81)
RDW: 13 % (ref 11.5–15.5)
WBC: 6.5 10*3/uL (ref 4.0–10.5)
nRBC: 0 % (ref 0.0–0.2)

## 2018-05-15 LAB — SODIUM
Sodium: 124 mmol/L — ABNORMAL LOW (ref 135–145)
Sodium: 124 mmol/L — ABNORMAL LOW (ref 135–145)
Sodium: 126 mmol/L — ABNORMAL LOW (ref 135–145)
Sodium: 127 mmol/L — ABNORMAL LOW (ref 135–145)

## 2018-05-15 LAB — PROCALCITONIN: Procalcitonin: <0.1 ng/mL

## 2018-05-15 LAB — CK: Total CK: 119 U/L (ref 49–397)

## 2018-05-15 LAB — SEDIMENTATION RATE: Sed Rate: 27 mm/hr — ABNORMAL HIGH (ref 0–16)

## 2018-05-15 LAB — FIBRINOGEN: Fibrinogen: 561 mg/dL — ABNORMAL HIGH (ref 210–475)

## 2018-05-15 LAB — PHOSPHORUS: Phosphorus: 3.3 mg/dL (ref 2.5–4.6)

## 2018-05-15 LAB — LACTATE DEHYDROGENASE: LDH: 180 U/L (ref 98–192)

## 2018-05-15 LAB — C-REACTIVE PROTEIN: CRP: 2.1 mg/dL — ABNORMAL HIGH (ref <1.0)

## 2018-05-15 LAB — D-DIMER, QUANTITATIVE: D-Dimer, Quant: 0.8 ug/mL-FEU — ABNORMAL HIGH (ref 0.00–0.50)

## 2018-05-15 MED ORDER — HYDROXYCHLOROQUINE SULFATE 200 MG PO TABS: 400.0000 mg | ORAL_TABLET | Freq: Two times a day (BID) | ORAL | Status: DC

## 2018-05-15 MED ORDER — ZINC SULFATE 220 (50 ZN) MG PO CAPS
220.0000 mg | ORAL_CAPSULE | Freq: Every day | ORAL | Status: DC
  Administered 2018-05-15 – 2018-05-17 (×3): 220 mg via ORAL
  Filled 2018-05-15 (×3): qty 1

## 2018-05-15 MED ORDER — HYDROXYCHLOROQUINE SULFATE 200 MG PO TABS: 200.0000 mg | ORAL_TABLET | Freq: Two times a day (BID) | ORAL | Status: DC

## 2018-05-15 MED ORDER — POTASSIUM CHLORIDE CRYS ER 20 MEQ PO TBCR
40.0000 meq | EXTENDED_RELEASE_TABLET | Freq: Once | ORAL | Status: AC
  Administered 2018-05-15: 40 meq via ORAL
  Filled 2018-05-15: qty 2

## 2018-05-15 MED ORDER — VITAMIN C 500 MG PO TABS
500.0000 mg | ORAL_TABLET | Freq: Every day | ORAL | Status: DC
  Administered 2018-05-15 – 2018-05-17 (×3): 500 mg via ORAL
  Filled 2018-05-15 (×3): qty 1

## 2018-05-15 MED ORDER — MAGNESIUM SULFATE 2 GM/50ML IV SOLN
2.0000 g | Freq: Once | INTRAVENOUS | Status: AC
  Administered 2018-05-15: 2 g via INTRAVENOUS
  Filled 2018-05-15: qty 50

## 2018-05-15 NOTE — Progress Notes (Signed)
PROGRESS NOTE    Raymond Alvarez  XQJ:[id number] DOB: 1955/03/06 DOA: 05/11/2018 PCP: Raina Mina., MD   Brief Narrative:  HPI per Dr. Shela Leff on 05/11/2018 The patient Raymond Alvarez is a 63 y.o. male with medical history significant of hypertension, CKD, asthma, anxiety, depression, gout, GERD presenting as a transfer from [facility] ED for evaluation of vomiting and diarrhea. Patient states 2 weeks ago he had bronchitis for which his PCP prescribed azithromycin. He has continued to cough since then.  Denies any shortness of breath.  Does report exposure to a coworker 2 weeks ago who had a family member positive for COVID-19.  States for the past 3 to 4 days he has been having nausea, NBNB emesis, and nonbloody diarrhea.  He has not been able to keep any food down and now continues to have dry heaves.  Reports having generalized weakness. Denies any abdominal pain. States he had a fever 2 weeks ago but no recent fevers.  Interim History: Nephrology and PCCM consulted and patient was admitted to the ICU. Was on 3% Hypertonic Saline at 30 mL/hr and Na+ is slowly improved to 124. Nephrology stopped 3% Hypertonic Saline and has now started patient on IV Lasix 40 mg BID.    Assessment & Plan:   Principal Problem:   Hyponatremia Active Problems:   Viral gastroenteritis   Suspected Covid-19 Virus Infection   Rhabdomyolysis   Pulmonary nodule  Severe Hyponatremia, improving slightly  -In the setting of GI loss from vomiting and diarrhea and likley Hypovolemic Hyponatremia but could have SIADH component and Nephrology suspects . Labs at [facility] showing sodium 112.   -Patient is asymptomatic.  No change in mental status or seizures.   -Patient received 2 L normal saline boluses in the ED.   -Repeat labs here showing Sodium slowly trending upwards and is 124 -Recommendation was to start hypertonic saline infusion at 20 cc/hr and this was increased to 30 mL/hr overnight on 4/8-4/9 and Dr.  Hollie Salk now stopped it as Na+ is now 124 - IV Desmopressin 2 mg every 6 hours for a total of 48 hours and this has been completed and stopped   -Goal sodium correction of 6 mEq in 24 hours.  -Neurochecks every 2 hours. Repeat BMP every 4 hours now stopped  -Checked Urine Sodium and was 37 and Urine Osm was 344; Check serum osmolarity.   -Patient transferred to the ICU after admitting physician discussed with PCCM. -Continue Further Care per Nephrology  -Continue to Monitor Na+ Closely to avoid Overcorrection and Na+ q4h changed to BID; -Nephrology started patient on IV Lasix 40 mg BID   Acute Viral Gastroenteritis, improved -Patient was presenting with complaints of nausea, vomiting, and diarrhea.   -Labs done at [facility] showing AST 168, ALT 155, alk phos 66, and T bili 0.6; LFTs are improving   -Lipase normal.  -CT abdomen pelvis done at [facility] negative for acute finding but did show Hepatic steatosis.  Mildly complicated right liver cyst without change from the prior CT.  Scattered left colon diverticula.  No diverticulitis. -C/w IV fluid hydration delineated by Nephrology  -GI pathogen panel ordered and pending to be done  -C/w Loperamide 4m po PRN Diarrhea or Loose Stools -Zofran PRN Nausea/Vomiting   COVID-19 Disease -Patient is presented with a viral GI illness and cough.  Reports recent exposure to a family member of a positive case (Son).   [facility] LDH 793, CRP 20, procalcitonin 0.17, Ferritin 555.  -  Chest CT done at [facility] showing a spectrum of findings in the lungs which can be seen with acute atypical infection, in particular, viral pneumonia including COVID-19.   -SARS-CoV-2 testing has been ordered and pending since 05/11/18 at our hospital but at [facility] he tested Positive -C/w Droplet and contact precautions for now -Respiratory Viral Panel Negative  -MRSA PCR was Negative  -Antitussives as needed for cough (Robitussin DM changed to po  Guaifenesin 1200 mg po BID and Tussionex) and Albuterol Inhaler -Currently on Room Air, Not Leukopenic now an; Will Check CBC with Differential in AM  -Continue to Monitor for Desaturations and Respiratory Status  -Repeat CXR this AM pending -Started Hydroxychloroquine but unfortunately not able to give it this AM due to Prolonged qTC of 515 -Started Vitamin C 500 mg po Daily and Zinc Sulfate 220 mg po Daily -Checked Labs and CK was 119, LDH was 180, CRP was 2.1, PCT was <0.10, ESR was 27, D-Dimer was 0.80 and Fibrinogen wa 561; IL-6 and Ferritin was 332; AST was 42 -Continue to Monitor D-Dimer Daily and if >3 will start Full Dose Anticoagulation   Mild Rhabdomyolysis/Elevated CK -CK 1081 on labs done at [facility] on admission.   -Patient received 2 L normal saline boluses in the ED and is now on 3% of Hypertonic Saline at 30 mEQ and Nephrology following -Repeat CK the day before yesterday was 826 and is now 119 -Careful IV fluid hydration to avoid rapid correction of hyponatremia -Will not Repeat CK in AM   Pulmonary Nodule -CT chest done at [facility] showing a 7 mm left lower lobe nodule increased from 5 mm on the prior CT.   -Recommend follow-up CT chest in 6 months to reassess for stability versus further enlargement.  Hypertension -Currently Normotensive and on the lower side   -Continue to monitor blood pressure per Protocol -Last BP was 130/88  Asthma -Stable.  -Currently having No bronchospasm.  -Stopped Guaifenesin-Dextromethorphan 10 mL po q4hprn and started Guaifenesin 1200 mg po BID for Cough along with Chlorpheniramine-Hydrocodone 5 mL po q12h -C/w Albuterol Inhaler 1-2 puff IH q4hprn   Obesity -Estimated body mass index is 36.51 kg/m as calculated from the following:   Height as of 10/07/17: '6\' 2"'$  (1.88 m).   Weight as of this encounter: 129 kg. -Weight Loss and Dietary Counseling given  Abnormal LFTs/Elevated LFTs, trending down -Patient's AST was 126 and ALT was  116; ? Related to hepatic Steatosis but likley from Nausea and Vomiting -Trending down as AST is now 42 and ALT is 66 -Continue to Monitor and Trend and repeat CMP in AM -If worsening or not improving will check Acute Hepatitis Panel and RUQ U/S -Repeat CMP in AM   Normocytic Anemia -Patient's Hb/Hct went from 13.8/37.8 -> 12.8/36.8 -> 11.8/34.1 -> 12.8/36.0 -Checked Anemia Panel and showed an iron level of 51, U IBC of 265, TIBC of 316, saturation ratios of 16%, ferritin level 332, folate level of 8.8, and vitamin B12 of 1060 -Continue to Monitor for S/Sx of Bleeding -Repeat CBC in AM   Hyperglycemia in the setting of New Onset Diabetes Mellitus Type 2 -Patient's Blood Glucose this AM was 91 but has been ranging from 91-106 -Checked HbA1c in AM to r/o Diabetes and patient is Diabetic as HbA1c was 6.6 -Continue to Monitor Blood Sugars carefully; If remain elevated will place on Sensitive Novolog SSI AC  PPE worn by physician: Pincus Badder, Gown, Gloves, and hair bouffant.  DVT prophylaxis: Enoxaparin 40 mg sq  q24h Code Status: FULL CODE Family Communication: No family present at bedside but updated Wife   Disposition Plan: Remain in the ICU and transfer to Progressive when ok with Nephrology  Consultants:   PCCM/Pulmonary  Nephrology    Procedures: None   Antimicrobials:  Anti-infectives (From admission, onward)   Start     Dose/Rate Route Frequency Ordered Stop   05/16/18 1000  hydroxychloroquine (PLAQUENIL) tablet 200 mg  Status:  Discontinued     200 mg Oral 2 times daily 05/15/18 0755 05/15/18 0834   05/15/18 1000  hydroxychloroquine (PLAQUENIL) tablet 400 mg  Status:  Discontinued     400 mg Oral 2 times daily 05/15/18 0755 05/15/18 0834     Subjective: Seen and examined at bedside states he was coughing a bit and it was a little productive.  States he slept well last night and has been sleepy has had since he been here.  Wearing his compression stocking now.  No  nausea or vomiting.  Saturating well on room air.  No other concerns or complaints at this time.  I discussed with him about starting hydroxychloroquine and he is agreeable to plan once his QTC is improved.  Objective: Vitals:   05/15/18 0700 05/15/18 0800 05/15/18 0900 05/15/18 1000  BP: (!) 129/98 (!) 131/93 (!) 142/89 130/88  Pulse: 67 63 82 91  Resp: _0 Temp:      TempSrc:      SpO2: 97% 96% 100% 99%  Weight:        Intake/Output Summary (Last 24 hours) at 05/15/2018 1202 Last data filed at 05/15/2018 1000 Gross per 24 hour  Intake 390 ml  Output 1175 ml  Net -785 ml   Filed Weights   05/13/18 0500 05/14/18 0500 05/15/18 0424  Weight: 129.3 kg 131.7 kg 129 kg   Examination: Physical Exam:  Constitutional: Well-nourished, well-developed morbidly obese Caucasian male currently no acute distress sitting up in bed and having his IVs looked at. Eyes: Conjunctive are normal.  Lids normal.  Sclera anicteric ENMT: External ears and nose appear normal.  Grossly normal hearing Neck: Appears supple no JVD Respiratory: Diminished auscultation bilaterally no appreciable wheezing, rales, rhonchi.  Patient is not tachypneic or using any accessory muscles but does have a somewhat productive cough.  Unlabored breathing and not wearing any supplemental oxygen via nasal cannula Cardiovascular: Regular rate and rhythm.  Has some lower extremity edema but is wearing his compression stockings. Abdomen: Soft, nontender, distended secondary body habitus.  Bowel sounds present GU: Deferred Musculoskeletal: No clubbing or cyanosis.  No joint deformities noted in upper and lower extremities. Skin: Is warm and dry with no appreciable rashes or lesions on imaging evaluation. Neurologic: Cranial nerves II through XII grossly intact with no appreciable focal deficits. Psychiatric: Pleasant mood and affect.  Intact judgment and insight.  Patient is awake, alert, and oriented.  Data Reviewed: I  have personally reviewed following labs and imaging studies  CBC: Recent Labs  Lab 05/11/18 2313 05/13/18 1119 05/14/18 0505 05/15/18 0617  WBC 5.8 6.9 6.3 6.5  NEUTROABS  --  5.2 4.2 4.2  HGB 13.8 12.8* 11.8* 12.8*  HCT 37.8* 36.8* 34.1* 36.0*  MCV 83.1 85.2 86.5 84.9  PLT 186 200 192 606   Basic Metabolic Panel: Recent Labs  Lab 05/11/18 2313  05/13/18 1119  05/14/18 0505  05/14/18 1925 05/14/18 2254 05/15/18 0214 05/15/18 0617 05/15/18 1002  NA 114*   < > 120*   < >  121*   < > 120* 123* 124* 124* 124*  K 3.8  --  3.9  --  4.0  --   --   --   --  3.7  --   CL 83*  --  88*  --  91*  --   --   --   --  91*  --   CO2 21*  --  22  --  22  --   --   --   --  21*  --   GLUCOSE 85  --  106*  --  91  --   --   --   --  93  --   BUN 12  --  11  --  10  --   --   --   --  10  --   CREATININE 1.12  --  1.11  --  1.13  --   --   --   --  1.06  --   CALCIUM 8.2*  --  8.0*  --  7.9*  --   --   --   --  8.5*  --   MG  --   --  1.8  --  1.8  --   --   --   --  1.7  --   PHOS  --   --  2.7  --  2.7  --   --   --   --  3.3  --    < > = values in this interval not displayed.   GFR: CrCl cannot be calculated (Unknown ideal weight.). Liver Function Tests: Recent Labs  Lab 05/11/18 2313 05/13/18 1119 05/14/18 0505 05/15/18 0617  AST 126* 70* 48* 42*  ALT 116* 86* 70* 66*  ALKPHOS 62 57 52 50  BILITOT 0.7 0.7 0.6 0.5  PROT 5.9* 5.9* 5.3* 5.3*  ALBUMIN 3.3* 3.2* 2.9* 2.9*   No results for input(s): LIPASE, AMYLASE in the last 168 hours. No results for input(s): AMMONIA in the last 168 hours. Coagulation Profile: No results for input(s): INR, PROTIME in the last 168 hours. Cardiac Enzymes: Recent Labs  Lab 05/12/18 0035 05/14/18 0505 05/15/18 1002  CKTOTAL 826* 180 119   BNP (last 3 results) No results for input(s): PROBNP in the last 8760 hours. HbA1C: Recent Labs    05/14/18 0505  HGBA1C 6.6*   CBG: No results for input(s): GLUCAP in the last 168 hours. Lipid  Profile: No results for input(s): CHOL, HDL, LDLCALC, TRIG, CHOLHDL, LDLDIRECT in the last 72 hours. Thyroid Function Tests: No results for input(s): TSH, T4TOTAL, FREET4, T3FREE, THYROIDAB in the last 72 hours. Anemia Panel: Recent Labs    05/14/18 0505  VITAMINB12 1,060*  FOLATE 8.8  FERRITIN 332  TIBC 316  IRON 51  RETICCTPCT 0.8   Sepsis Labs: No results for input(s): PROCALCITON, LATICACIDVEN in the last 168 hours.  Recent Results (from the past 240 hour(s))  MRSA PCR Screening     Status: None   Collection Time: 05/11/18 10:11 PM  Result Value Ref Range Status   MRSA by PCR NEGATIVE NEGATIVE Final    Comment:        The GeneXpert MRSA Assay (FDA approved for NASAL specimens only), is one component of a comprehensive MRSA colonization surveillance program. It is not intended to diagnose MRSA infection nor to guide or monitor treatment for MRSA infections. Performed at [facility], [place] [address], [place], [place] [address]  Novel Coronavirus, NAA (hospital order; send-out to ref lab)     Status: Abnormal   Collection Time: 05/11/18 11:11 PM  Result Value Ref Range Status   SARS-CoV-2, NAA DETECTED (A) NOT DETECTED Final    Comment: Positive (Detected) results are indicative of active infection with SARS CoV 2. A positive result does not rule out bacterial infection or coinfection with other viruses. Detection of SARS CoV 2 viral RNA may not indicate that SARS CoV 2 is the causative agent for clinical symptoms. Positive and negative predictive values of testing are highly dependent on prevalence. False positive test results are more likely when prevalence is moderate to low. RESULT CALLED TO, READ BACK BY AND VERIFIED WITH: J. BISHOP RN 05/14/2018 1650 BEAMJ (NOTE) The expected result is Negative (Not Detected). The SARS CoV 2 test is intended for the presumptive qualitative  detection of nucleic acid from SARS CoV 2 in upper and lower  respiratory  specimens. Testing methodology is real time RT PCR. Test results must be correlated with clinical presentation and  evaluated in the context of other laboratory and epidemiologic data.  Test performance can be affected because the epidemiology and  clinical  spectrum of infection caused by SARS CoV 2 is not fully  known. For example, the optimum types of specimens to collect and  when during the course of infection these specimens are most likely  to contain detectable viral RNA may not be known. This test has not been Food and Drug Administration (FDA) cleared or  approved and has been authorized by FDA under an Emergency Use  Authorization (EUA). The test is only authorized for the duration of  the declaration that circumstances exist justifying the authorization  of emergency use of in vitro diagnostic tests for detection and or  diagnosis of SARS CoV 2 under Section 564(b)(1) of the Act, 21 U.S.C.  section [phone] 3(b)(1), unless the authorization is terminated or  revoked sooner. [facility] is certified under the  Clinical Laboratory Improvement Amendments of 1988 (CLIA), 42 U.S.C.  section [phone], to perform high complexity tests. Performed at [facility] [id number], [address], [place], [place] [address] Laboratory Director: Loleta Books, MD Fact Sheet for Healthcare Providers  BankingDealers.co.za Fact Sheet for Patients  StrictlyIdeas.no    Coronavirus Source NASOPHARYNGEAL  Final    Comment: Performed at [facility], [address], [place], [place] [address]  Respiratory Panel by PCR     Status: None   Collection Time: 05/11/18 11:13 PM  Result Value Ref Range Status   Adenovirus NOT DETECTED NOT DETECTED Final   Coronavirus 229E NOT DETECTED NOT DETECTED Final    Comment: (NOTE) The Coronavirus on the Respiratory Panel, DOES NOT test for the novel   Coronavirus (2019 nCoV)    Coronavirus HKU1 NOT DETECTED NOT DETECTED Final   Coronavirus NL63 NOT DETECTED NOT DETECTED Final   Coronavirus OC43 NOT DETECTED NOT DETECTED Final   Metapneumovirus NOT DETECTED NOT DETECTED Final   Rhinovirus / Enterovirus NOT DETECTED NOT DETECTED Final   Influenza A NOT DETECTED NOT DETECTED Final   Influenza B NOT DETECTED NOT DETECTED Final   Parainfluenza Virus 1 NOT DETECTED NOT DETECTED Final   Parainfluenza Virus 2 NOT DETECTED NOT DETECTED Final   Parainfluenza Virus 3 NOT DETECTED NOT DETECTED Final   Parainfluenza Virus 4 NOT DETECTED NOT DETECTED Final   Respiratory Syncytial Virus NOT DETECTED NOT DETECTED Final  Bordetella pertussis NOT DETECTED NOT DETECTED Final   Chlamydophila pneumoniae NOT DETECTED NOT DETECTED Final   Mycoplasma pneumoniae NOT DETECTED NOT DETECTED Final    Comment: Performed at [facility], [place] [address], [place], [place] [address]    Radiology Studies: No results found.  Scheduled Meds: . enoxaparin (LOVENOX) injection  40 mg Subcutaneous Q24H  . furosemide  40 mg Intravenous BID  . guaiFENesin  1,200 mg Oral BID  . olopatadine  1 drop Both Eyes Daily  . vitamin C  500 mg Oral Daily  . zinc sulfate  220 mg Oral Daily   Continuous Infusions: . magnesium sulfate 1 - 4 g bolus IVPB      LOS: 4 days   Kerney Elbe, DO [facility] PAGER is on [place]  If 7PM-7AM, please contact night-coverage [email or web address] Password [facility] 05/15/2018, 12:02 PM

## 2018-05-15 NOTE — Progress Notes (Signed)
[facility] Progress Note    Assessment/ Plan:   1.  Severe hyponatremia:  On presentation was 112.  Now up to 121 in first 48hrs. 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  3% hypertonic saline at 30 mL/ hr.  S/p DDAVP for the first 48 hrs to ensure no overcorrection.  TSH and AM cortisol are WNL.  Appropriately off 3%.  On IV Lasix 40 IV BID.  Can back up to BID Na checks.  2. COVID-19: CT with viral PNA on CT chest, supportive care.  Plaquenil, zinc, Vit C,  IL-6 and LDH pending.    3.  N/V/D: CT benign at OSH.   Supportive care.    4.  poss h/o CKD: listed in chart, renal function here normal. CTM.  5.  HTN: currently normotensive without meds-- hold for now.    6.  Mildly elevated CK: ~1000.  Trend.  Not really severe enough to cause appreciable AKI.   Subjective:    COVID +.  Off 3%.  Na rising.     Objective:   BP (!) 131/93 (BP Location: Left Arm)   Pulse 63   Temp 98.1 F (36.7 C) (Oral)   Resp 12   Wt 129 kg   SpO2 96%   BMI 36.51 kg/m   Intake/Output Summary (Last 24 hours) at 05/15/2018 0948 Last data filed at 05/15/2018 0400 Gross per 24 hour  Intake 150 ml  Output 1175 ml  Net -1025 ml   Weight change: -2.7 kg  Physical Exam: Sitting up in bed, games on phone again.   Rest of exam limited by COVID-19 so relying on physical exam performed by primary team Imaging: No results found.  Labs: BMET Recent Labs  Lab 05/11/18 2313  05/13/18 1119  05/14/18 0505 05/14/18 1108 05/14/18 1532 05/14/18 1925 05/14/18 2254 05/15/18 0214 05/15/18 0617  NA 114*   < > 120*   < > 121* 123* 122* 120* 123* 124* 124*  K 3.8  --  3.9  --  4.0  --   --   --   --   --  3.7  CL 83*  --  88*  --  91*  --   --   --   --   --  91*  CO2 21*  --  22  --  22  --   --   --   --   --  21*  GLUCOSE 85  --  106*  --  91  --   --   --   --   --  93  BUN 12  --  11  --  10  --   --   --   --   --  10   CREATININE 1.12  --  1.11  --  1.13  --   --   --   --   --  1.06  CALCIUM 8.2*  --  8.0*  --  7.9*  --   --   --   --   --  8.5*  PHOS  --   --  2.7  --  2.7  --   --   --   --   --  3.3   < > = values in this interval not displayed.   CBC Recent Labs  Lab 05/11/18 2313 05/13/18 1119 05/14/18 0505 05/15/18 0617  WBC 5.8 6.9 6.3 6.5  NEUTROABS  --  5.2 4.2 4.2  HGB 13.8 12.8* 11.8* 12.8*  HCT 37.8* 36.8* 34.1* 36.0*  MCV 83.1 85.2 86.5 84.9  PLT 186 200 192 215    Medications:    . enoxaparin (LOVENOX) injection  40 mg Subcutaneous Q24H  . furosemide  40 mg Intravenous BID  . guaiFENesin  1,200 mg Oral BID  . olopatadine  1 drop Both Eyes Daily  . vitamin C  500 mg Oral Daily  . zinc sulfate  220 mg Oral Daily     Madelon Lips MD [facility] pgr [phone] 05/15/2018, 9:48 AM

## 2018-05-16 ENCOUNTER — Inpatient Hospital Stay (HOSPITAL_COMMUNITY): Payer: BC Managed Care – PPO

## 2018-05-16 LAB — CBC WITH DIFFERENTIAL/PLATELET
Abs Immature Granulocytes: 0.24 10*3/uL — ABNORMAL HIGH (ref 0.00–0.07)
Basophils Absolute: 0 10*3/uL (ref 0.0–0.1)
Basophils Relative: 0 %
Eosinophils Absolute: 0.2 10*3/uL (ref 0.0–0.5)
Eosinophils Relative: 2 %
HCT: 39 % (ref 39.0–52.0)
Hemoglobin: 13.6 g/dL (ref 13.0–17.0)
Immature Granulocytes: 3 %
Lymphocytes Relative: 15 %
Lymphs Abs: 1.3 10*3/uL (ref 0.7–4.0)
MCH: 30 pg (ref 26.0–34.0)
MCHC: 34.9 g/dL (ref 30.0–36.0)
MCV: 85.9 fL (ref 80.0–100.0)
Monocytes Absolute: 0.9 10*3/uL (ref 0.1–1.0)
Monocytes Relative: 10 %
Neutro Abs: 6 10*3/uL (ref 1.7–7.7)
Neutrophils Relative %: 70 %
Platelets: 274 10*3/uL (ref 150–400)
RBC: 4.54 MIL/uL (ref 4.22–5.81)
RDW: 13.2 % (ref 11.5–15.5)
WBC: 8.7 10*3/uL (ref 4.0–10.5)
nRBC: 0 % (ref 0.0–0.2)

## 2018-05-16 LAB — COMPREHENSIVE METABOLIC PANEL
ALT: 69 U/L — ABNORMAL HIGH (ref 0–44)
AST: 42 U/L — ABNORMAL HIGH (ref 15–41)
Albumin: 3.2 g/dL — ABNORMAL LOW (ref 3.5–5.0)
Alkaline Phosphatase: 55 U/L (ref 38–126)
Anion gap: 12 (ref 5–15)
BUN: 11 mg/dL (ref 6–20)
CO2: 25 mmol/L (ref 22–32)
Calcium: 9 mg/dL (ref 8.9–10.3)
Chloride: 91 mmol/L — ABNORMAL LOW (ref 98–111)
Creatinine, Ser: 1.22 mg/dL (ref 0.61–1.24)
GFR calc Af Amer: >60 mL/min (ref >60)
GFR calc non Af Amer: >60 mL/min (ref >60)
Glucose, Bld: 101 mg/dL — ABNORMAL HIGH (ref 70–99)
Potassium: 4.1 mmol/L (ref 3.5–5.1)
Sodium: 128 mmol/L — ABNORMAL LOW (ref 135–145)
Total Bilirubin: 0.7 mg/dL (ref 0.3–1.2)
Total Protein: 5.9 g/dL — ABNORMAL LOW (ref 6.5–8.1)

## 2018-05-16 LAB — FERRITIN: Ferritin: 281 ng/mL (ref 24–336)

## 2018-05-16 LAB — SODIUM
Sodium: 128 mmol/L — ABNORMAL LOW (ref 135–145)
Sodium: 128 mmol/L — ABNORMAL LOW (ref 135–145)
Sodium: 127 mmol/L — ABNORMAL LOW (ref 135–145)

## 2018-05-16 LAB — TROPONIN I: Troponin I: <0.03 ng/mL

## 2018-05-16 LAB — MAGNESIUM: Magnesium: 1.7 mg/dL (ref 1.7–2.4)

## 2018-05-16 LAB — INTERLEUKIN-6, PLASMA: Interleukin-6, Plasma: 23.2 pg/mL — ABNORMAL HIGH (ref 0.0–12.2)

## 2018-05-16 LAB — C-REACTIVE PROTEIN: CRP: 1.2 mg/dL — ABNORMAL HIGH

## 2018-05-16 LAB — D-DIMER, QUANTITATIVE: D-Dimer, Quant: 0.74 ug{FEU}/mL — ABNORMAL HIGH (ref 0.00–0.50)

## 2018-05-16 LAB — PHOSPHORUS: Phosphorus: 3.7 mg/dL (ref 2.5–4.6)

## 2018-05-16 LAB — FIBRINOGEN: Fibrinogen: 657 mg/dL — ABNORMAL HIGH (ref 210–475)

## 2018-05-16 LAB — PROCALCITONIN: Procalcitonin: <0.1 ng/mL

## 2018-05-16 MED ORDER — SODIUM CHLORIDE 0.9 % IV SOLN: INTRAVENOUS | Status: DC | PRN

## 2018-05-16 MED ORDER — MAGNESIUM SULFATE 2 GM/50ML IV SOLN
2.0000 g | Freq: Once | INTRAVENOUS | Status: AC
  Administered 2018-05-16: 2 g via INTRAVENOUS
  Filled 2018-05-16: qty 50

## 2018-05-16 MED ORDER — FUROSEMIDE 10 MG/ML IJ SOLN: 40.0000 mg | Freq: Once | INTRAMUSCULAR | Status: DC

## 2018-05-16 MED ORDER — FUROSEMIDE 10 MG/ML IJ SOLN
40.0000 mg | Freq: Every day | INTRAMUSCULAR | Status: DC
  Administered 2018-05-17: 40 mg via INTRAVENOUS
  Filled 2018-05-16: qty 4

## 2018-05-16 NOTE — Progress Notes (Addendum)
PROGRESS NOTE    Raymond Alvarez  KZS:[id number] DOB: 12/14/1955 DOA: 05/11/2018 PCP: Raina Mina., MD   Brief Narrative:  HPI per Dr. Shela Leff on 05/11/2018 The patient Raymond Alvarez is a 63 y.o. male with medical history significant of hypertension, CKD, asthma, anxiety, depression, gout, GERD presenting as a transfer from [facility] ED for evaluation of vomiting and diarrhea. Patient states 2 weeks ago he had bronchitis for which his PCP prescribed azithromycin. He has continued to cough since then.  Denies any shortness of breath.  Does report exposure to a coworker 2 weeks ago who had a family member positive for COVID-19.  States for the past 3 to 4 days he has been having nausea, NBNB emesis, and nonbloody diarrhea.  He has not been able to keep any food down and now continues to have dry heaves.  Reports having generalized weakness. Denies any abdominal pain. States he had a fever 2 weeks ago but no recent fevers.  Interim History: Nephrology and PCCM consulted and patient was admitted to the ICU. Was on 3% Hypertonic Saline at 30 mL/hr and Na+ is slowly improved to 128 but back down to 127. Nephrology stopped 3% Hypertonic Saline and has now started patient on IV Lasix 40 mg BID and now reduced to IV Lasix 40 mg Daily. Per Nephrology will change to po Lasix when Sodium is >130.   Assessment & Plan:   Principal Problem:   Hyponatremia Active Problems:   Viral gastroenteritis   Suspected Covid-19 Virus Infection   Rhabdomyolysis   Pulmonary nodule  Severe Hyponatremia, improving slightly  -In the setting of GI loss from vomiting and diarrhea and likley Hypovolemic Hyponatremia but could have SIADH component and Nephrology suspects . Labs at [facility] showing sodium 112.   -Patient is asymptomatic.  No change in mental status or seizures.   -Patient received 2 L normal saline boluses in the ED.   -Repeat labs here showing Sodium slowly trending upwards and is 127 (was 128 this  AM) -Recommendation was to start hypertonic saline infusion at 20 cc/hr and this was increased to 30 mL/hr overnight on 4/8-4/9 and Dr. Hollie Salk now stopped it as Na+ is now 127 - IV Desmopressin 2 mg every 6 hours for a total of 48 hours and this has been completed and stopped   -Goal sodium correction of 6 mEq in 24 hours.  -Neurochecks every 2 hours. Repeat BMP every 4 hours now stopped  -Checked Urine Sodium and was 37 and Urine Osm was 344; Check serum osmolarity.   -Patient transferred to the ICU after admitting physician discussed with PCCM. -Continue Further Care per Nephrology  -Continue to Monitor Na+ Closely to avoid Overcorrection and Na+ q4h changed to BID; -Nephrology started patient on IV Lasix 40 mg BID and decreased to IV Lasix 40 mg Daily  -Continue to Monitor I's/O's; Patient is -29,028.5 -Neurology will not change the p.o. Lasix until patient's sodium is greater than 130 and will also place on fluid restriction at that time (Currently on 1200 mL Fluid Restriction).  Continue to Monitor Sodium BID  Acute Viral Gastroenteritis, improved -Patient was presenting with complaints of nausea, vomiting, and diarrhea.   -Labs done at [facility] showing AST 168, ALT 155, alk phos 66, and T bili 0.6; LFTs are improving   -Lipase normal.  -CT abdomen pelvis done at [facility] negative for acute finding but did show Hepatic steatosis.  Mildly complicated right liver cyst without change from the prior CT.  Scattered  left colon diverticula.  No diverticulitis. -GI Pathogen Panel ordered and not done -IV fluid hydration delineated by Nephrology now stopped  -GI pathogen panel ordered and pending to be done  -C/w Loperamide 14m po PRN Diarrhea or Loose Stools -Zofran PRN Nausea/Vomiting   COVID-19 Disease -Patient is presented with a viral GI illness and cough.  Reports recent exposure to a family member of a positive case (Son).   [facility] LDH 7[facility], CRP 20,  procalcitonin 0.17, Ferritin 555.  [facility] which can be seen with acute atypical infection, in particular, viral pneumonia including COVID-19.   -SARS-CoV-2 testing has been ordered and pending since 05/11/18 at [facility] -C/w Droplet and contact precautions for now -Respiratory Viral Panel Negative  -MRSA PCR was Negative  -Antitussives as needed for cough (Robitussin DM changed to po Guaifenesin 1200 mg po BID and Tussionex) and Albuterol Inhaler -Currently on Room Air, Not Leukopenic now an; Will Check CBC with Differential in AM  -Continue to Monitor for Desaturations and Respiratory Status  -Repeat CXR this AM showed "Stable cardiomediastinal silhouette with normal heart size. No pneumothorax. No pleural effusion. Patchy opacity in peripheral lungs, left greater than right, minimally improved." -Ordered Hydroxychloroquine but unfortunately not able to give it due to Prolonged QTc; Will avoid Starting currently  -Started Vitamin C 500 mg po Daily and Zinc Sulfate 220 mg po Daily -Checked Labs and CK was 119, LDH was 180, CRP was 2.1, PCT was <0.10, ESR was 27, D-Dimer was 0.80 and Fibrinogen wa 561; IL-6 and Ferritin was 332; AST was 42 -Repeat Labs this AM showed Ferritin Level of 281, CRP at 1.2, PCT was <0.10, Repeat D-Dimer 0.74, Fibrinogen at 657 -Continue to Monitor D-Dimer Daily and if >3 will start Full Dose Anticoagulation  -Repeat COVID Labs in AM  -Currently on Room Air  Mild Rhabdomyolysis/Elevated CK -[facility]   -Patient received 2 L normal saline boluses in the ED and is now on 3% of Hypertonic Saline at 30 mEQ and Nephrology following -Repeat CK now improved to 119 -Careful IV fluid hydration to avoid rapid correction of hyponatremia -Will not Repeat CK in AM   Pulmonary Nodule [facility]  nodule increased from 5 mm on the prior CT.   -Recommend follow-up CT chest in 6 months to reassess for stability versus further enlargement.  Hypertension -Currently Normotensive and on the lower side   -Continue to monitor blood pressure per Protocol -Last BP was 136/92  Asthma -Stable.  -Currently having No bronchospasm.  -Stopped Guaifenesin-Dextromethorphan 10 mL po q4hprn and started Guaifenesin 1200 mg po BID for Cough along with Chlorpheniramine-Hydrocodone 5 mL po q12h -C/w Albuterol Inhaler 1-2 puff IH q4hprn   Obesity -Estimated body mass index is 35.83 kg/m as calculated from the following:   Height as of 10/07/17: _0  (1.88 m).   Weight as of this encounter: 126.6 kg. -Weight Loss and Dietary Counseling given  Abnormal LFTs/Elevated LFTs, trending down -Patient's AST was 126 and ALT was 116; ? Related to hepatic Steatosis but likley from Nausea and Vomiting -Trending down as AST is now 42 and ALT is 69 -Continue to Monitor and Trend and repeat CMP in AM -If worsening or not improving will check Acute Hepatitis Panel and RUQ U/S -Repeat CMP in  AM   Normocytic Anemia -Patient's Hb/Hct went from 13.8/37.8 -> 12.8/36.8 -> 11.8/34.1 -> 12.8/36.0 -> 13.6/39.0 -Checked Anemia Panel and showed an iron level of 51, U IBC of 265, TIBC of 316, saturation ratios of 16%, ferritin level 332, folate level of 8.8, and vitamin B12 of 1060 -Continue to Monitor for S/Sx of Bleeding -Repeat CBC in AM   Hyperglycemia in the setting of New Onset Diabetes Mellitus Type 2 -Patient's Blood Glucose this AM was 91 but has been ranging from 91-106 -Checked HbA1c in AM to r/o Diabetes and patient is Diabetic as HbA1c was 6.6 -Continue to Monitor Blood Sugars carefully; If remain elevated will place on Sensitive Novolog SSI AC  PPE worn by physician: Pincus Badder, Gown, Gloves, and hair bouffant.  DVT prophylaxis: Enoxaparin 40 mg sq q24h Code Status: FULL CODE Family Communication: No  family present at bedside but updated Wife   Disposition Plan: Transferred to Progressive Care; Will D/C when Na+ is >130 and Cleared by Nephrology  Consultants:   PCCM/Pulmonary  Nephrology    Procedures: None   Antimicrobials:  Anti-infectives (From admission, onward)   Start     Dose/Rate Route Frequency Ordered Stop   05/16/18 1000  hydroxychloroquine (PLAQUENIL) tablet 200 mg  Status:  Discontinued     200 mg Oral 2 times daily 05/15/18 0755 05/15/18 0834   05/15/18 1000  hydroxychloroquine (PLAQUENIL) tablet 400 mg  Status:  Discontinued     400 mg Oral 2 times daily 05/15/18 0755 05/15/18 0834     Subjective: Seen and examined at bedside states his coughing is improving. No CP or SOB.  Denies any lightheadedness or dizziness.  No other concerns or complaints at this time.  Objective: Vitals:   05/16/18 0700 05/16/18 0800 05/16/18 0900 05/16/18 1000  BP: 122/82 (!) 112/94  (!) 136/92  Pulse: 79 95 (!) 103   Resp: 16 (!) _0 Temp:  98.1 F (36.7 C)    TempSrc:  Oral    SpO2: 98% 100% 99%   Weight:        Intake/Output Summary (Last 24 hours) at 05/16/2018 1504 Last data filed at 05/16/2018 1000 Gross per 24 hour  Intake 1164.37 ml  Output 4405 ml  Net -3240.63 ml   Filed Weights   05/14/18 0500 05/15/18 0424 05/16/18 0439  Weight: 131.7 kg 129 kg 126.6 kg   Examination: Physical Exam:  Constitutional: Well-nourished, well-developed morbidly obese Caucasian male currently no acute distress Eyes: Lids and conjunctive are normal.  Sclera anicteric ENMT: External ears nose appear normal.  Grossly normal hearing Neck: Appears supple with no JVD Respiratory: Diminished auscultation bilaterally with no appreciable wheezing, rales, car.  Patient was not tachypneic or using accessory muscles to breathe and is still coughing some.  Has unlabored breathing and is not wearing any supplemental oxygen via nasal cannula Cardiovascular: Slightly tachycardic rate but  regular rhythm. Abdomen: Soft, nontender, distended secondary body habitus.  Bowel sounds present GU: Deferred Musculoskeletal: No contractures or cyanosis.  No joint deformities noted in the upper and lower extremities Skin: Skin is warm and dry no appreciable rashes or lesions on skin evaluation Neurologic: Cranial nerves II through XII grossly intact no appreciable focal deficits Psychiatric: Normal mood and affect.  Intact judgment and insight.  Patient is awake, alert and oriented  Data Reviewed: I have personally reviewed following labs and imaging studies  CBC: Recent Labs  Lab 05/11/18 2313 05/13/18 1119 05/14/18 0505 05/15/18 0617 05/16/18 0700  WBC 5.8 6.9 6.3 6.5 8.7  NEUTROABS  --  5.2 4.2 4.2 6.0  HGB 13.8 12.8* 11.8* 12.8* 13.6  HCT 37.8* 36.8* 34.1* 36.0* 39.0  MCV 83.1 85.2 86.5 84.9 85.9  PLT 186 200 192 215 387   Basic Metabolic Panel: Recent Labs  Lab 05/11/18 2313  05/13/18 1119  05/14/18 0505  05/15/18 0617  05/15/18 1924 05/15/18 2314 05/16/18 0255 05/16/18 0700 05/16/18 1035  NA 114*   < > 120*   < > 121*   < > 124*   < > 127* 128* 128* 128* 127*  K 3.8  --  3.9  --  4.0  --  3.7  --   --   --   --  4.1  --   CL 83*  --  88*  --  91*  --  91*  --   --   --   --  91*  --   CO2 21*  --  22  --  22  --  21*  --   --   --   --  25  --   GLUCOSE 85  --  106*  --  91  --  93  --   --   --   --  101*  --   BUN 12  --  11  --  10  --  10  --   --   --   --  11  --   CREATININE 1.12  --  1.11  --  1.13  --  1.06  --   --   --   --  1.22  --   CALCIUM 8.2*  --  8.0*  --  7.9*  --  8.5*  --   --   --   --  9.0  --   MG  --   --  1.8  --  1.8  --  1.7  --   --   --   --  1.7  --   PHOS  --   --  2.7  --  2.7  --  3.3  --   --   --   --  3.7  --    < > = values in this interval not displayed.   GFR: CrCl cannot be calculated (Unknown ideal weight.). Liver Function Tests: Recent Labs  Lab 05/11/18 2313 05/13/18 1119 05/14/18 0505 05/15/18 0617  05/16/18 0700  AST 126* 70* 48* 42* 42*  ALT 116* 86* 70* 66* 69*  ALKPHOS 62 57 52 50 55  BILITOT 0.7 0.7 0.6 0.5 0.7  PROT 5.9* 5.9* 5.3* 5.3* 5.9*  ALBUMIN 3.3* 3.2* 2.9* 2.9* 3.2*   No results for input(s): LIPASE, AMYLASE in the last 168 hours. No results for input(s): AMMONIA in the last 168 hours. Coagulation Profile: No results for input(s): INR, PROTIME in the last 168 hours. Cardiac Enzymes: Recent Labs  Lab 05/12/18 0035 05/14/18 0505 05/15/18 1002 05/16/18 0700  CKTOTAL 826* 180 119  --   TROPONINI  --   --   --  <0.03   BNP (last 3 results) No results for input(s): PROBNP in the last 8760 hours. HbA1C: Recent Labs    05/14/18 0505  HGBA1C 6.6*   CBG: No results for input(s): GLUCAP in the last 168 hours. Lipid Profile: No results for input(s): CHOL, HDL, LDLCALC, TRIG, CHOLHDL, LDLDIRECT in the last 72 hours. Thyroid Function Tests: No results for input(s): TSH,  T4TOTAL, FREET4, T3FREE, THYROIDAB in the last 72 hours. Anemia Panel: Recent Labs    05/14/18 0505 05/16/18 0700  VITAMINB12 1,060*  --   FOLATE 8.8  --   FERRITIN 332 281  TIBC 316  --   IRON 51  --   RETICCTPCT 0.8  --    Sepsis Labs: Recent Labs  Lab 05/15/18 1002 05/16/18 0700  PROCALCITON <0.10 <0.10    Recent Results (from the past 240 hour(s))  MRSA PCR Screening     Status: None   Collection Time: 05/11/18 10:11 PM  Result Value Ref Range Status   MRSA by PCR NEGATIVE NEGATIVE Final    Comment:        The GeneXpert MRSA Assay (FDA approved for NASAL specimens only), is one component of a comprehensive MRSA colonization surveillance program. It is not intended to diagnose MRSA infection nor to guide or monitor treatment for MRSA infections. Performed at [facility], [place] [address] [place], [place] [address]   Novel Coronavirus, NAA (hospital order; send-out to ref lab)     Status: Abnormal   Collection Time: 05/11/18 11:11 PM  Result Value Ref Range  Status   SARS-CoV-2, NAA DETECTED (A) NOT DETECTED Final    Comment: Positive (Detected) results are indicative of active infection with SARS CoV 2. A positive result does not rule out bacterial infection or coinfection with other viruses. Detection of SARS CoV 2 viral RNA may not indicate that SARS CoV 2 is the causative agent for clinical symptoms. Positive and negative predictive values of testing are highly dependent on prevalence. False positive test results are more likely when prevalence is moderate to low. RESULT CALLED TO, READ BACK BY AND VERIFIED WITH: J. BISHOP RN 05/14/2018 1650 BEAMJ (NOTE) The expected result is Negative (Not Detected). The SARS CoV 2 test is intended for the presumptive qualitative  detection of nucleic acid from SARS CoV 2 in upper and lower  respiratory specimens. Testing methodology is real time RT PCR. Test results must be correlated with clinical presentation and  evaluated in the context of other laboratory and epidemiologic data.  Test performance can be affected because the epidemiology and  clinical  spectrum of infection caused by SARS CoV 2 is not fully  known. For example, the optimum types of specimens to collect and  when during the course of infection these specimens are most likely  to contain detectable viral RNA may not be known. This test has not been Food and Drug Administration (FDA) cleared or  approved and has been authorized by FDA under an Emergency Use  Authorization (EUA). The test is only authorized for the duration of  the declaration that circumstances exist justifying the authorization  of emergency use of in vitro diagnostic tests for detection and or  diagnosis of SARS CoV 2 under Section 564(b)(1) of the Act, 21 U.S.C.  section [phone] 3(b)(1), unless the authorization is terminated or  revoked sooner. [facility] is certified under the  Clinical Laboratory Improvement Amendments of 1988 (CLIA), 42 U.S.C.   section 4020076300, to perform high complexity tests. Performed at [facility] [id number], [place], [place] [address] Laboratory Director: Loleta Books, MD Fact Sheet for Healthcare Providers  BankingDealers.co.za Fact Sheet for Patients  StrictlyIdeas.no    Coronavirus Source NASOPHARYNGEAL  Final    Comment: Performed at [facility], [address] [place], [place] [address]  Respiratory  Panel by PCR     Status: None   Collection Time: 05/11/18 11:13 PM  Result Value Ref Range Status   Adenovirus NOT DETECTED NOT DETECTED Final   Coronavirus 229E NOT DETECTED NOT DETECTED Final    Comment: (NOTE) The Coronavirus on the Respiratory Panel, DOES NOT test for the novel  Coronavirus (2019 nCoV)    Coronavirus HKU1 NOT DETECTED NOT DETECTED Final   Coronavirus NL63 NOT DETECTED NOT DETECTED Final   Coronavirus OC43 NOT DETECTED NOT DETECTED Final   Metapneumovirus NOT DETECTED NOT DETECTED Final   Rhinovirus / Enterovirus NOT DETECTED NOT DETECTED Final   Influenza A NOT DETECTED NOT DETECTED Final   Influenza B NOT DETECTED NOT DETECTED Final   Parainfluenza Virus 1 NOT DETECTED NOT DETECTED Final   Parainfluenza Virus 2 NOT DETECTED NOT DETECTED Final   Parainfluenza Virus 3 NOT DETECTED NOT DETECTED Final   Parainfluenza Virus 4 NOT DETECTED NOT DETECTED Final   Respiratory Syncytial Virus NOT DETECTED NOT DETECTED Final   Bordetella pertussis NOT DETECTED NOT DETECTED Final   Chlamydophila pneumoniae NOT DETECTED NOT DETECTED Final   Mycoplasma pneumoniae NOT DETECTED NOT DETECTED Final    Comment: Performed at [facility], [address] [place],  [address]    Radiology Studies: Dg Chest [facility] 1 View  Result Date: 05/16/2018 CLINICAL DATA:  Dyspnea, COVID-19 positive EXAM: PORTABLE CHEST 1 VIEW COMPARISON:  Chest radiograph from one day prior.  FINDINGS: Stable cardiomediastinal silhouette with normal heart size. No pneumothorax. No pleural effusion. Patchy opacity in peripheral lungs, left greater than right, minimally improved. IMPRESSION: Minimal improvement in patchy peripheral lung opacities, left greater than right. Electronically Signed   By: Ilona Sorrel M.D.   On: 05/16/2018 08:09   Dg Chest Port 1 View  Result Date: 05/15/2018 CLINICAL DATA:  Short of breath EXAM: PORTABLE CHEST 1 VIEW COMPARISON:  05/13/2018 FINDINGS: Normal heart size. Low lung volumes. There is patchy airspace disease in the peripheral left upper and lower lung zones. Hazy airspace opacities in the peripheral right upper lung zone. These findings are not significantly changed. No pneumothorax. IMPRESSION: Stable bilateral airspace disease. Electronically Signed   By: Marybelle Killings M.D.   On: 05/15/2018 13:25   Scheduled Meds: . enoxaparin (LOVENOX) injection  40 mg Subcutaneous Q24H  . [START ON 05/17/2018] furosemide  40 mg Intravenous Daily  . guaiFENesin  1,200 mg Oral BID  . olopatadine  1 drop Both Eyes Daily  . vitamin C  500 mg Oral Daily  . zinc sulfate  220 mg Oral Daily   Continuous Infusions: . sodium chloride 10 mL/hr at 05/16/18 0952    LOS: 5 days   Kerney Elbe, DO [facility] PAGER is on AMION  If 7PM-7AM, please contact night-coverage [email or web address] Password [facility] 05/16/2018, 3:04 PM

## 2018-05-16 NOTE — Progress Notes (Addendum)
[facility] Progress Note    Assessment/ Plan:   1.  Severe hyponatremia:  On presentation was 112. Up to 121 in first 48hrs. 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  S/p 3% hypertonic saline and DDAVP for the first 48 hrs to ensure no overcorrection.  TSH and AM cortisol are WNL.  Last Na 128.  Addend: robust UOP yesterday- nearly 5L.  Decrease IV Lasix to daily and then when Na > 130 could switch to oral Lasix with fluid restriction.  Can back up to BID Na checks.  2. COVID-19: CT with viral PNA on CT chest, supportive care.  Plaquenil, zinc, Vit C,  IL-6 and LDH being checked along with DIC profiles  3.  N/V/D: CT benign at OSH.   Supportive care.    4.  poss h/o CKD: listed in chart, renal function here normal. CTM.  5.  HTN: currently normotensive without meds-- hold for now.    6.  Mildly elevated CK: ~1000.  Trend.  Not really severe enough to cause appreciable AKI.   7.  Hypokalemia: replete prn  Subjective:    Doing OK.  Na back up to 128.  Robust urine output.     Objective:   BP 122/82   Pulse 79   Temp 98.3 F (36.8 C) (Oral)   Resp 16   Wt 126.6 kg   SpO2 98%   BMI 35.83 kg/m   Intake/Output Summary (Last 24 hours) at 05/16/2018 0755 Last data filed at 05/16/2018 0300 Gross per 24 hour  Intake 1170 ml  Output 4775 ml  Net -3605 ml   Weight change: -2.4 kg  Physical Exam: Sitting up in bed, games on phone again.   Rest of exam limited by COVID-19 so relying on physical exam performed by primary team Imaging: Dg Chest Port 1 View  Result Date: 05/15/2018 CLINICAL DATA:  Short of breath EXAM: PORTABLE CHEST 1 VIEW COMPARISON:  05/13/2018 FINDINGS: Normal heart size. Low lung volumes. There is patchy airspace disease in the peripheral left upper and lower lung zones. Hazy airspace opacities in the peripheral right upper lung zone. These findings are not significantly changed. No  pneumothorax. IMPRESSION: Stable bilateral airspace disease. Electronically Signed   By: Marybelle Killings M.D.   On: 05/15/2018 13:25    Labs: BMET Recent Labs  Lab 05/11/18 2313  05/13/18 1119  05/14/18 0505  05/15/18 0214 05/15/18 0617 05/15/18 1002 05/15/18 1413 05/15/18 1924 05/15/18 2314 05/16/18 0255  NA 114*   < > 120*   < > 121*   < > 124* 124* 124* 126* 127* 128* 128*  K 3.8  --  3.9  --  4.0  --   --  3.7  --   --   --   --   --   CL 83*  --  88*  --  91*  --   --  91*  --   --   --   --   --   CO2 21*  --  22  --  22  --   --  21*  --   --   --   --   --   GLUCOSE 85  --  106*  --  91  --   --  93  --   --   --   --   --   BUN 12  --  11  --  10  --   --  10  --   --   --   --   --   CREATININE 1.12  --  1.11  --  1.13  --   --  1.06  --   --   --   --   --   CALCIUM 8.2*  --  8.0*  --  7.9*  --   --  8.5*  --   --   --   --   --   PHOS  --   --  2.7  --  2.7  --   --  3.3  --   --   --   --   --    < > = values in this interval not displayed.   CBC Recent Labs  Lab 05/13/18 1119 05/14/18 0505 05/15/18 0617 05/16/18 0700  WBC 6.9 6.3 6.5 8.7  NEUTROABS 5.2 4.2 4.2 6.0  HGB 12.8* 11.8* 12.8* 13.6  HCT 36.8* 34.1* 36.0* 39.0  MCV 85.2 86.5 84.9 85.9  PLT 200 192 215 274    Medications:    . enoxaparin (LOVENOX) injection  40 mg Subcutaneous Q24H  . furosemide  40 mg Intravenous BID  . guaiFENesin  1,200 mg Oral BID  . olopatadine  1 drop Both Eyes Daily  . vitamin C  500 mg Oral Daily  . zinc sulfate  220 mg Oral Daily     Madelon Lips MD [facility] pgr [phone] 05/16/2018, 7:55 AM

## 2018-05-17 DIAGNOSIS — I447 Left bundle-branch block, unspecified: Secondary | ICD-10-CM

## 2018-05-17 LAB — CBC WITH DIFFERENTIAL/PLATELET
Abs Immature Granulocytes: 0.3 10*3/uL — ABNORMAL HIGH (ref 0.00–0.07)
Basophils Absolute: 0 10*3/uL (ref 0.0–0.1)
Basophils Relative: 0 %
Eosinophils Absolute: 0.3 10*3/uL (ref 0.0–0.5)
Eosinophils Relative: 3 %
HCT: 40.4 % (ref 39.0–52.0)
Hemoglobin: 14.5 g/dL (ref 13.0–17.0)
Immature Granulocytes: 3 %
Lymphocytes Relative: 14 %
Lymphs Abs: 1.4 10*3/uL (ref 0.7–4.0)
MCH: 31.3 pg (ref 26.0–34.0)
MCHC: 35.9 g/dL (ref 30.0–36.0)
MCV: 87.3 fL (ref 80.0–100.0)
Monocytes Absolute: 0.8 10*3/uL (ref 0.1–1.0)
Monocytes Relative: 9 %
Neutro Abs: 7.1 10*3/uL (ref 1.7–7.7)
Neutrophils Relative %: 71 %
Platelets: 367 10*3/uL (ref 150–400)
RBC: 4.63 MIL/uL (ref 4.22–5.81)
RDW: 13.4 % (ref 11.5–15.5)
WBC: 9.9 10*3/uL (ref 4.0–10.5)
nRBC: 0 % (ref 0.0–0.2)

## 2018-05-17 LAB — C-REACTIVE PROTEIN: CRP: 1.1 mg/dL — ABNORMAL HIGH (ref <1.0)

## 2018-05-17 LAB — COMPREHENSIVE METABOLIC PANEL
ALT: 72 U/L — ABNORMAL HIGH (ref 0–44)
AST: 42 U/L — ABNORMAL HIGH (ref 15–41)
Albumin: 3.6 g/dL (ref 3.5–5.0)
Alkaline Phosphatase: 62 U/L (ref 38–126)
Anion gap: 12 (ref 5–15)
BUN: 14 mg/dL (ref 6–20)
CO2: 25 mmol/L (ref 22–32)
Calcium: 9.4 mg/dL (ref 8.9–10.3)
Chloride: 93 mmol/L — ABNORMAL LOW (ref 98–111)
Creatinine, Ser: 1.23 mg/dL (ref 0.61–1.24)
GFR calc Af Amer: >60 mL/min (ref >60)
GFR calc non Af Amer: >60 mL/min (ref >60)
Glucose, Bld: 112 mg/dL — ABNORMAL HIGH (ref 70–99)
Potassium: 4.4 mmol/L (ref 3.5–5.1)
Sodium: 130 mmol/L — ABNORMAL LOW (ref 135–145)
Total Bilirubin: 0.9 mg/dL (ref 0.3–1.2)
Total Protein: 6.7 g/dL (ref 6.5–8.1)

## 2018-05-17 LAB — D-DIMER, QUANTITATIVE (NOT AT ARMC): D-Dimer, Quant: 0.55 ug/mL-FEU — ABNORMAL HIGH (ref 0.00–0.50)

## 2018-05-17 LAB — FERRITIN: Ferritin: 281 ng/mL (ref 24–336)

## 2018-05-17 LAB — PROCALCITONIN: Procalcitonin: <0.1 ng/mL

## 2018-05-17 LAB — PHOSPHORUS: Phosphorus: 4.1 mg/dL (ref 2.5–4.6)

## 2018-05-17 LAB — MAGNESIUM: Magnesium: 2.2 mg/dL (ref 1.7–2.4)

## 2018-05-17 MED ORDER — GUAIFENESIN ER 600 MG PO TB12: 1200.0000 mg | ORAL_TABLET | Freq: Two times a day (BID) | ORAL | 0 refills | Status: DC

## 2018-05-17 MED ORDER — ZINC SULFATE 220 (50 ZN) MG PO CAPS: 220.0000 mg | ORAL_CAPSULE | Freq: Every day | ORAL | 0 refills | Status: DC

## 2018-05-17 MED ORDER — FUROSEMIDE 40 MG PO TABS: 40.0000 mg | ORAL_TABLET | Freq: Every day | ORAL | 0 refills | Status: DC

## 2018-05-17 MED ORDER — ASCORBIC ACID 500 MG PO TABS: 500.0000 mg | ORAL_TABLET | Freq: Every day | ORAL | 0 refills | Status: DC

## 2018-05-17 NOTE — Progress Notes (Signed)
Patient ID: Raymond Alvarez, male   DOB: 25-Mar-1955, 63 y.o.   MRN: [id number] [facility] Progress Note   Assessment/ Plan:   1.  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  Status post hypertonic saline earlier in admission and sodium level appears to be maintained in the 127-128 range off of 3% saline and on furosemide.  Labs pending from this morning.  Urine output decent. 2.  COVID-19 pneumonia: Ongoing supportive management 3.  Hypertension: Blood pressure within acceptable range, will continue to follow with diuresis. 4.  Elevated CPK: Mild and unlikely etiology for acute kidney injury.  Subjective:   Without acute event overnight   Objective:   BP 110/65 (BP Location: Right Arm)   Pulse 72   Temp 97.9 F (36.6 C) (Oral)   Resp 12   Wt 126.8 kg   SpO2 95%   BMI 35.89 kg/m   Intake/Output Summary (Last 24 hours) at 05/17/2018 0746 Last data filed at 05/17/2018 0300 Gross per 24 hour  Intake 1084.37 ml  Output 2480 ml  Net -1395.63 ml   Weight change: 0.2 kg  Physical Exam: Gen: Comfortably resting in bed CVS: Pulse regular rhythm, normal rate Resp: Coarse breath sounds bilaterally Abd: Soft, flat, nontender Ext: Without lower extremity edema  Imaging: Dg Chest Port 1 View  Result Date: 05/16/2018 CLINICAL DATA:  Dyspnea, COVID-19 positive EXAM: PORTABLE CHEST 1 VIEW COMPARISON:  Chest radiograph from one day prior. FINDINGS: Stable cardiomediastinal silhouette with normal heart size. No pneumothorax. No pleural effusion. Patchy opacity in peripheral lungs, left greater than right, minimally improved. IMPRESSION: Minimal improvement in patchy peripheral lung opacities, left greater than right. Electronically Signed   By: Ilona Sorrel M.D.   On: 05/16/2018 08:09   Dg Chest Port 1 View  Result Date: 05/15/2018 CLINICAL DATA:  Short of breath EXAM: PORTABLE CHEST 1  VIEW COMPARISON:  05/13/2018 FINDINGS: Normal heart size. Low lung volumes. There is patchy airspace disease in the peripheral left upper and lower lung zones. Hazy airspace opacities in the peripheral right upper lung zone. These findings are not significantly changed. No pneumothorax. IMPRESSION: Stable bilateral airspace disease. Electronically Signed   By: Marybelle Killings M.D.   On: 05/15/2018 13:25    Labs: BMET Recent Labs  Lab 05/11/18 2313  05/13/18 1119  05/14/18 0505  05/15/18 0617 05/15/18 1002 05/15/18 1413 05/15/18 1924 05/15/18 2314 05/16/18 0255 05/16/18 0700 05/16/18 1035  NA 114*   < > 120*   < > 121*   < > 124* 124* 126* 127* 128* 128* 128* 127*  K 3.8  --  3.9  --  4.0  --  3.7  --   --   --   --   --  4.1  --   CL 83*  --  88*  --  91*  --  91*  --   --   --   --   --  91*  --   CO2 21*  --  22  --  22  --  21*  --   --   --   --   --  25  --   GLUCOSE 85  --  106*  --  91  --  93  --   --   --   --   --  101*  --   BUN 12  --  11  --  10  --  10  --   --   --   --   --  11  --   CREATININE 1.12  --  1.11  --  1.13  --  1.06  --   --   --   --   --  1.22  --   CALCIUM 8.2*  --  8.0*  --  7.9*  --  8.5*  --   --   --   --   --  9.0  --   PHOS  --   --  2.7  --  2.7  --  3.3  --   --   --   --   --  3.7  --    < > = values in this interval not displayed.   CBC Recent Labs  Lab 05/13/18 1119 05/14/18 0505 05/15/18 0617 05/16/18 0700  WBC 6.9 6.3 6.5 8.7  NEUTROABS 5.2 4.2 4.2 6.0  HGB 12.8* 11.8* 12.8* 13.6  HCT 36.8* 34.1* 36.0* 39.0  MCV 85.2 86.5 84.9 85.9  PLT 200 192 215 274   Medications:    . enoxaparin (LOVENOX) injection  40 mg Subcutaneous Q24H  . furosemide  40 mg Intravenous Daily  . guaiFENesin  1,200 mg Oral BID  . olopatadine  1 drop Both Eyes Daily  . vitamin C  500 mg Oral Daily  . zinc sulfate  220 mg Oral Daily   Elmarie Shiley, MD 05/17/2018, 7:46 AM

## 2018-05-17 NOTE — Discharge Summary (Signed)
Physician Discharge Summary  Raymond Alvarez ENI:[id number] DOB: [birth year] DOA: 05/11/2018  PCP: Raina Mina., MD  Admit date: 05/11/2018 Discharge date: 05/17/2018  Admitted From: Home Disposition: Home  Recommendations for Outpatient Follow-up:  1. Follow up with PCP in 1-2 weeks 2. Follow up with Cardiology in the outpatient setting  3. Please obtain CMP/CBC, Mag, Phos in one week 4. Repeat CXR in 3-6 weeks  5. Follow up Chest CT in 6 months for a Pulmonary Nodule  6. Please follow up on the following pending results:  Home Health: No Equipment/Devices: None   Discharge Condition: Stable CODE STATUS: FULL CODE  Diet recommendation: Heart Healthy Carb Modified Diet with 1200 mL Fluid Restriction   Brief/Interim Summary: HPI per Dr. Shela Leff on 05/11/2018 The patient Raymond Alvarez a 62 y.o.malewith medical history significant ofhypertension, CKD, asthma, anxiety, depression, gout, GERDpresenting as a transfer from [facility] ED for evaluation of vomiting and diarrhea. Patient states 2 weeks ago he had bronchitis for which his PCP prescribed azithromycin. He has continued to cough since then. Denies any shortness of breath. Does report exposure to a coworker 2 weeks ago who had a family member positive for COVID-19. States for the past 3 to 4 days he has been having nausea, NBNB emesis, and nonbloody diarrhea. He has not been able to keep any food down and now continues to have dry heaves. Reports having generalized weakness. Denies any abdominal pain.States he had a fever 2 weeks ago but no recent fevers.  Interim History: Nephrology and PCCM consulted and patient was admitted to the ICU. Was on 3% Hypertonic Saline at 30 mL/hr and Na+ is slowly improved to 128 but back down to 127. Nephrology stopped 3% Hypertonic Saline and has now started patient on IV Lasix 40 mg BID and now reduced to IV Lasix 40 mg Daily. Per Nephrology will change to po Lasix when Sodium is >130  and he received and IV dose today and will be changed to po. Discussed with Nephrology an from a Renal Standpoint he is stable to D/C. He was deemed medically stable to D/C Home and follow up with PCP and Cardiology in the outpatient setting. Home Isolation guidelines were provided. At the time of Discharge patient was not hypoxic or requiring any O2 and stated that his cough has improved.   Discharge Diagnoses:  Principal Problem:   Hyponatremia Active Problems:   Viral gastroenteritis   Suspected Covid-19 Virus Infection   Rhabdomyolysis   Pulmonary nodule   Severe Hyponatremia, improving slightly  -In the setting of GI loss from vomiting and diarrhea and likley Hypovolemic Hyponatremia but could have SIADH component and Nephrology suspects .Labs at [place] showing sodium 112.  -Patient is asymptomatic. No change in mental status or seizures.  -Patient received 2 L normal saline boluses in the ED.  -Repeat labs here showing Sodium slowly trending upwards and is 130 -Recommendation was to start hypertonic saline infusion at 20 cc/hr and this was increased to 30 mL/hr overnight on 4/8-4/9 and Nephrology now stopped it as Na+ is now 130 - IV Desmopressin 2 mg every 6 hours for a total of 48 hours and this has been completed and stopped   -Goal sodium correction of 6 mEq in 24 hours.  -Neurochecks every 2 hours.Repeat BMP every 4 hours now stopped  -Checked Urine Sodium and was 37 and Urine Osm was 344;Check serum osmolarity.  -Patient transferred to the ICU after admitting physician discussed with PCCM and transferred to the Floor  05/16/2018 -Continue Further Care per Nephrology  -Continue to Monitor Na+ Closely to avoid Overcorrection and Na+ q4h changed to BID; -Nephrology started patient on IV Lasix 40 mg BID and decreased to IV Lasix 40 mg Daily and now recommending switching to po Lasix 40 mg Daily  -Continue to Monitor I's/O's; Patient is -[phone] -Neurology will not change  the p.o. Lasix until patient's sodium is greater than 130 and will also place on fluid restriction at that time (Currently on 1200 mL Fluid Restriction).  Continued to Monitor Sodium BID -Repeat CMP as an outpatient with PCP within 1 week and Have patient follow up with Nephrology if Necessary   Acute Viral Gastroenteritis, improved -Patient was presenting with complaints of nausea, vomiting, and diarrhea.  -Labs done at [facility] showing AST 168, ALT 155, alk phos 66,andT bili 0.6; LFTs are improving and now stable  -Lipase normal.  [facility] but did show Hepatic steatosis. Mildly complicated right liver cyst without change from the prior CT. Scattered left colon diverticula. No diverticulitis. -GI Pathogen Panel ordered and not done -IV fluid hydration delineated by Nephrology now stopped  -GI pathogen panel ordered and pending to be done  -C/w Loperamide 68m po PRN Diarrhea or Loose Stools -Zofran PRN Nausea/Vomiting   COVID-19 Disease -Patient is presented with a viral GI illness and cough. Reports recent exposure to a family member of a positive case (Son).  [facility] LDH 793, CRP 20, procalcitonin 0.17, Ferritin 555. -[facility] findings in [facility] lungs which can be seen with acute atypical infection, in particular, viral pneumonia including COVID-19.  -SARS-CoV-2testing has been ordered and pending since 05/11/18 at [facility] hospital [facility] -C/w Droplet and contact precautions for now -Respiratory Viral Panel Negative  -MRSA PCR was Negative  -Antitussives as needed for cough (Robitussin DM changed to po Guaifenesin 1200 mg po BID and Tussionex) and Albuterol Inhaler -Currently on Room Air, Not Leukopenic now an; Will Check CBC with Differential in AM  -Continue to Monitor for Desaturations and Respiratory Status  -Repeat CXR this AM  showed "Stable cardiomediastinal silhouette with normal heart size. No pneumothorax. No pleural effusion. Patchy opacity in peripheral lungs, left greater than right, minimally improved." -Ordered Hydroxychloroquine but unfortunately not able to give it due to Prolonged QTc; Will avoid Starting currently as patient is stable  -Started Vitamin C 500 mg po Daily and Zinc Sulfate 220 mg po Daily and will continue at D/C  -Checked Labs and CK was 119, LDH was 180, CRP was 2.1, PCT was <0.10, ESR was 27, D-Dimer was 0.80 and Fibrinogen wa 561; IL-6 and Ferritin was 332; AST was 42 -Repeat Labs yesterday AM showed Ferritin Level of 281, CRP at 1.2, PCT was <0.10, Repeat D-Dimer 0.74, Fibrinogen at 657 -Continue to Monitor D-Dimer Daily and if >3 will start Full Dose Anticoagulation but D-Dimer  -Repeat COVID Labs this AM showed CRP of 1.1, Ferritin of 281, and D-Dimer of 0.55 -Currently on Room Air and having no Dyspnea and Cough is improving  -Repeat CXR in 3-6 weeks  Mild Rhabdomyolysis/Elevated CK -CK 1081on [place] [facility] [place].  -Patient received 2 L normal saline boluses in the ED and is now on 3% of Hypertonic Saline at 30 mEQ and Nephrology following -Repeat CK now improved to 119 -Careful IV fluid hydration to avoid rapid correction of hyponatremia -Will not Repeat CK now as  it was likely related to Coleta  Pulmonary Nodule -[facility] mm [facility] nodule increased from 5 mm on the prior CT. -Recommend follow-up CT chest in 6 months to reassess for stability versus further enlargement.  Hypertension -Currently Normotensive and on the lower side  -Continue to monitor blood pressure per Protocol -Last BP was 118/76 -Currently Holding His Lisinoprol-HCTZ and Spironolactone -Will D/C on Lasix 40 mg po Daily -Will defer to PCP to adjust/restart Home Antihypertensives   Asthma -Stable.  -Currently having No bronchospasm.   -Stopped Guaifenesin-Dextromethorphan 10 mL po q4hprn and started Guaifenesin 1200 mg po BID for Cough along with Chlorpheniramine-Hydrocodone 5 mL po q12h -C/w Albuterol Inhaler 1-2 puff IH q4hprn at D/C   Obesity -Estimated body mass index is 35.89 kg/m as calculated from the following:   Height as of 10/07/17: 6' 2"  (1.88 m).   Weight as of this encounter: 126.8 kg. -Weight Loss and Dietary Counseling given  Abnormal LFTs/Elevated LFTs, trending down -Patient's AST was 126 and ALT was 116; ? Related to hepatic Steatosis but likley from Nausea and Vomiting -Trending down as AST is now 42 and ALT is 72 -Continue to Monitor and Trend and repeat CMP in AM -If worsening or not improving will check Acute Hepatitis Panel and RUQ U/S but this can be done as an outpatient  -Repeat CMP within 1 week and discuss further workup with PCP   Normocytic Anemia -Patient's Hb/Hct went from 13.8/37.8 -> 12.8/36.8 -> 11.8/34.1 -> 12.8/36.0 -> 13.6/39.0 -> 14.5/40.4 -Checked Anemia Panel and showed an iron level of 51, U IBC of 265, TIBC of 316, saturation ratios of 16%, ferritin level 332, folate level of 8.8, and vitamin B12 of 1060 -Continue to Monitor for S/Sx of Bleeding -Repeat CBC in AM   Hyperglycemia in the setting of New Onset Diabetes Mellitus Type 2 -Patient's Blood Glucose this AM was 91 but has been ranging from 91-112 -Checked HbA1c in AM to r/o Diabetes and patient is Diabetic as HbA1c was 6.6 -Continue to Monitor Blood Sugars carefully; If remain elevated will place on Sensitive Novolog SSI AC -Follow up with PCP and defer oral medications to PCP   New LBBB -Discussed with Cardiology Dr. Margaretann Loveless who recommends outpatient Ischemic Evaluation and possible Stress Testing -Dr. Margaretann Loveless recommends ECHOCardiogram as an outpatient as well once he has recovered from [place] -Repeat EKG in the outpatient  -No CP and Troponin was <0.03  Discharge Instructions  Discharge Instructions     Call MD for:  difficulty breathing, headache or visual disturbances   Complete by:  As directed    Call MD for:  extreme fatigue   Complete by:  As directed    Call MD for:  hives   Complete by:  As directed    Call MD for:  persistant dizziness or light-headedness   Complete by:  As directed    Call MD for:  persistant nausea and vomiting   Complete by:  As directed    Call MD for:  redness, tenderness, or signs of infection (pain, swelling, redness, odor or green/yellow discharge around incision site)   Complete by:  As directed    Call MD for:  severe uncontrolled pain   Complete by:  As directed    Call MD for:  temperature >100.4   Complete by:  As directed    Diet - low sodium heart healthy   Complete by:  As directed    Diet -  low sodium heart healthy   Complete by:  As directed    1200 mL FLUID RESTRICTION   Diet Carb Modified   Complete by:  As directed    Discharge instructions   Complete by:  As directed    You were cared for by a hospitalist during your hospital stay. If you have any questions about your discharge medications or the care you received while you were in the hospital after you are discharged, you can call the unit and ask to speak with the hospitalist on call if the hospitalist that took care of you is not available. Once you are discharged, your primary care physician will handle any further medical issues. Please note that NO REFILLS for any discharge medications will be authorized once you are discharged, as it is imperative that you return to your primary care physician (or establish a relationship with a primary care physician if you do not have one) for your aftercare needs so that they can reassess your need for medications and monitor your lab values.  Follow up with PCP and discuss with them about restarting ANTIHYPERTENSIVES. Take all medications as prescribed. If symptoms change or worsen please return to the ED for evaluation    _____________________________________________________________________________________________________ Person Under Monitoring Name: Kymoni Monday  Location: [place] [address]   Infection Prevention Recommendations for Individuals Confirmed to have, or Being Evaluated for, 2019 Novel Coronavirus (COVID-19) Infection Who Receive Care at Home  Individuals who are confirmed to have, or are being evaluated for, COVID-19 should follow the prevention steps below until a healthcare provider or local or state health department says they can return to normal activities.  Stay home except to get medical care You should restrict activities outside your home, except for getting medical care. Do not go to work, school, or public areas, and do not use public transportation or taxis.  Call ahead before visiting your doctor Before your medical appointment, call the healthcare provider and tell them that you have, or are being evaluated for, COVID-19 infection. This will help the healthcare provider's office take steps to keep other people from getting infected. Ask your healthcare provider to call the local or state health department.  Monitor your symptoms Seek prompt medical attention if your illness is worsening (e.g., difficulty breathing). Before going to your medical appointment, call the healthcare provider and tell them that you have, or are being evaluated for, COVID-19 infection. Ask your healthcare provider to call the local or state health department.  Wear a facemask You should wear a facemask that covers your nose and mouth when you are in the same room with other people and when you visit a healthcare provider. People who live with or visit you should also wear a facemask while they are in the same room with you.  Separate yourself from other people in your home As much as possible, you should stay in a different room from other people in your home. Also, you  should use a separate bathroom, if available.  Avoid sharing household items You should not share dishes, drinking glasses, cups, eating utensils, towels, bedding, or other items with other people in your home. After using these items, you should wash them thoroughly with soap and water.  Cover your coughs and sneezes Cover your mouth and nose with a tissue when you cough or sneeze, or you can cough or sneeze into your sleeve. Throw used tissues in a lined trash can, and immediately wash your hands  with soap and water for at least 20 seconds or use an alcohol-based hand rub.  Wash your [facility] your hands often and thoroughly with soap and water for at least 20 seconds. You can use an alcohol-based hand sanitizer if soap and water are not available and if your hands are not visibly dirty. Avoid touching your eyes, nose, and mouth with unwashed hands.   Prevention Steps for Caregivers and Household Members of Individuals Confirmed to have, or Being Evaluated for, COVID-19 Infection Being Cared for in the Home  If you live with, or provide care at home for, a person confirmed to have, or being evaluated for, COVID-19 infection please follow these guidelines to prevent infection:  Follow healthcare provider's instructions Make sure that you understand and can help the patient follow any healthcare provider instructions for all care.  Provide for the patient's basic needs You should help the patient with basic needs in the home and provide support for getting groceries, prescriptions, and other personal needs.  Monitor the patient's symptoms If they are getting sicker, call his or her medical provider and tell them that the patient has, or is being evaluated for, COVID-19 infection. This will help the healthcare provider's office take steps to keep other people from getting infected. Ask the healthcare provider to call the local or state health department.  Limit the number of  people who have contact with the patient If possible, have only one caregiver for the patient. Other household members should stay in another home or place of residence. If this is not possible, they should stay in another room, or be separated from the patient as much as possible. Use a separate bathroom, if available. Restrict visitors who do not have an essential need to be in the home.  Keep older adults, very young children, and other sick people away from the patient Keep older adults, very young children, and those who have compromised immune systems or chronic health conditions away from the patient. This includes people with chronic heart, lung, or kidney conditions, diabetes, and cancer.  Ensure good ventilation Make sure that shared spaces in the home have good air flow, such as from an air conditioner or an opened window, weather permitting.  Wash your hands often Wash your hands often and thoroughly with soap and water for at least 20 seconds. You can use an alcohol based hand sanitizer if soap and water are not available and if your hands are not visibly dirty. Avoid touching your eyes, nose, and mouth with unwashed hands. Use disposable paper towels to dry your hands. If not available, use dedicated cloth towels and replace them when they become wet.  Wear a facemask and gloves Wear a disposable facemask at all times in the room and gloves when you touch or have contact with the patient's blood, body fluids, and/or secretions or excretions, such as sweat, saliva, sputum, nasal mucus, vomit, urine, or feces.  Ensure the mask fits over your nose and mouth tightly, and do not touch it during use. Throw out disposable facemasks and gloves after using them. Do not reuse. Wash your hands immediately after removing your facemask and gloves. If your personal clothing becomes contaminated, carefully remove clothing and launder. Wash your hands after handling contaminated clothing. Place  all used disposable facemasks, gloves, and other waste in a lined container before disposing them with other household waste. Remove gloves and wash your hands immediately after handling these items.  Do not share dishes, glasses, or  other household items with the patient Avoid sharing household items. You should not share dishes, drinking glasses, cups, eating utensils, towels, bedding, or other items with a patient who is confirmed to have, or being evaluated for, COVID-19 infection. After the person uses these items, you should wash them thoroughly with soap and water.  Wash laundry thoroughly Immediately remove and wash clothes or bedding that have blood, body fluids, and/or secretions or excretions, such as sweat, saliva, sputum, nasal mucus, vomit, urine, or feces, on them. Wear gloves when handling laundry from the patient. Read and follow directions on labels of laundry or clothing items and detergent. In general, wash and dry with the warmest temperatures recommended on the label.  Clean all areas the individual has used often Clean all touchable surfaces, such as counters, tabletops, doorknobs, bathroom fixtures, toilets, phones, keyboards, tablets, and bedside tables, every day. Also, clean any surfaces that may have blood, body fluids, and/or secretions or excretions on them. Wear gloves when cleaning surfaces the patient has come in contact with. Use a diluted bleach solution (e.g., dilute bleach with 1 part bleach and 10 parts water) or a household disinfectant with a label that says EPA-registered for coronaviruses. To make a bleach solution at home, add 1 tablespoon of bleach to 1 quart (4 cups) of water. For a larger supply, add  cup of bleach to 1 gallon (16 cups) of water. Read labels of cleaning products and follow recommendations provided on product labels. Labels contain instructions for safe and effective use of the cleaning product including precautions you should take when  applying the product, such as wearing gloves or eye protection and making sure you have good ventilation during use of the product. Remove gloves and wash hands immediately after cleaning.  Monitor yourself for signs and symptoms of illness Caregivers and household members are considered close contacts, should monitor their health, and will be asked to limit movement outside of the home to the extent possible. Follow the monitoring steps for close contacts listed on the symptom monitoring form.   ? If you have additional questions, contact your local health department or call the epidemiologist on call at [phone] (available 24/7). ? This guidance is subject to change. For the most up-to-date guidance from [facility], please refer to their website: YouBlogs.pl   Increase activity slowly   Complete by:  As directed      Allergies as of 05/17/2018      Reactions   Other Anaphylaxis   TREE NUTS   Sulfa Antibiotics       Medication List    STOP taking these medications   amoxicillin-clavulanate 875-125 MG tablet Commonly known as:  AUGMENTIN   indomethacin 25 MG capsule Commonly known as:  INDOCIN   lisinopril-hydrochlorothiazide 20-25 MG tablet Commonly known as:  PRINZIDE,ZESTORETIC   spironolactone 25 MG tablet Commonly known as:  ALDACTONE     TAKE these medications   albuterol 108 (90 Base) MCG/ACT inhaler Commonly known as:  PROVENTIL HFA;VENTOLIN HFA Inhale 2 puffs into the lungs every 4 (four) hours as needed for wheezing or shortness of breath.   allopurinol 300 MG tablet Commonly known as:  ZYLOPRIM Take 300 mg by mouth daily.   ascorbic acid 500 MG tablet Commonly known as:  VITAMIN C Take 1 tablet (500 mg total) by mouth daily. Start taking on:  May 18, 2018   chlorpheniramine-HYDROcodone 10-8 MG/5ML Suer Commonly known as:  TUSSIONEX Take 5 mLs by mouth 2 (two) times daily.  cyanocobalamin  1000 MCG/ML injection Commonly known as:  (VITAMIN B-12) Inject 1,000 mcg into the muscle every 30 (thirty) days.   dicyclomine 10 MG capsule Commonly known as:  BENTYL Take 1 capsule (10 mg total) by mouth 4 (four) times daily -  before meals and at bedtime.   furosemide 40 MG tablet Commonly known as:  Lasix Take 1 tablet (40 mg total) by mouth daily.   guaiFENesin 600 MG 12 hr tablet Commonly known as:  MUCINEX Take 2 tablets (1,200 mg total) by mouth 2 (two) times daily.   hyoscyamine 0.125 MG SL tablet Commonly known as:  LEVSIN SL Place 1 tablet (0.125 mg total) under the tongue every 4 (four) hours as needed. What changed:  reasons to take this   LORazepam 1 MG tablet Commonly known as:  ATIVAN Take 1 mg by mouth daily as needed for anxiety.   methocarbamol 750 MG tablet Commonly known as:  ROBAXIN Take 750 mg by mouth 4 (four) times daily.   omeprazole 20 MG capsule Commonly known as:  PRILOSEC Take 20 mg by mouth daily.   Pazeo 0.7 % Soln Generic drug:  Olopatadine HCl Place 1 drop into both eyes daily.   Trintellix 20 MG Tabs tablet Generic drug:  vortioxetine HBr Take 20 mg by mouth daily.   zinc sulfate 220 (50 Zn) MG capsule Take 1 capsule (220 mg total) by mouth daily. Start taking on:  May 18, 2018      Follow-up Information    Raina Mina., MD. Call.   Specialty:  Internal Medicine Why:  FOLLOW UP WITH PCP WITHIN 1 week and Repeat Bloodwork Contact information: [address] [place] [address] [phone]        Park Liter, MD. Call.   Specialty:  Cardiology Why:  FOLLOW UP for Outpatient ISCHEMIC EVALUATION and EVALUATION FOR NEW LBBB Contact information: [place] [address] [phone]          Allergies  Allergen Reactions  . Other Anaphylaxis    TREE NUTS  . Sulfa Antibiotics    Consultations: Nephrology PCCM/Pulmonary  Procedures/Studies: Dg Chest Port 1 View  Result Date:  05/16/2018 CLINICAL DATA:  Dyspnea, COVID-19 positive EXAM: PORTABLE CHEST 1 VIEW COMPARISON:  Chest radiograph from one day prior. FINDINGS: Stable cardiomediastinal silhouette with normal heart size. No pneumothorax. No pleural effusion. Patchy opacity in peripheral lungs, left greater than right, minimally improved. IMPRESSION: Minimal improvement in patchy peripheral lung opacities, left greater than right. Electronically Signed   By: Ilona Sorrel M.D.   On: 05/16/2018 08:09   Dg Chest Port 1 View  Result Date: 05/15/2018 CLINICAL DATA:  Short of breath EXAM: PORTABLE CHEST 1 VIEW COMPARISON:  05/13/2018 FINDINGS: Normal heart size. Low lung volumes. There is patchy airspace disease in the peripheral left upper and lower lung zones. Hazy airspace opacities in the peripheral right upper lung zone. These findings are not significantly changed. No pneumothorax. IMPRESSION: Stable bilateral airspace disease. Electronically Signed   By: Marybelle Killings M.D.   On: 05/15/2018 13:25   Dg Chest Port 1 View  Result Date: 05/13/2018 CLINICAL DATA:  Shortness of breath EXAM: PORTABLE CHEST 1 VIEW COMPARISON:  Two days ago FINDINGS: Patchy bilateral airspace disease as confirmed by CT. Low lung volumes. Borderline heart size accentuated by technique. Negative mediastinal contours. IMPRESSION: Stable pneumonia and low lung volumes. Electronically Signed   By: Monte Fantasia M.D.   On: 05/13/2018 08:09  Subjective: Seen and examined at bedside patient was doing well.  Denies any chest pain, lightheadedness or dizziness.  Denies any shortness of breath nausea or vomiting.  Saturating well on room air and feels well.  Sodium is improved and he is stable to be discharged home after discussion with nephrology.  He will need to follow-up with PCP in 1 week and follow-up with cardiology in outpatient setting patient understands and agrees with the plan.  Patient was told about home isolation guidelines and the risks were  discussed the patient understands and agrees.  Discharge Exam: Vitals:   05/17/18 0300 05/17/18 0746  BP: 110/65 118/76  Pulse: 72 80  Resp: 12 12  Temp: 97.9 F (36.6 C) (!) 97.5 F (36.4 C)  SpO2: 95% 97%   Vitals:   05/16/18 2200 05/16/18 2300 05/17/18 0300 05/17/18 0746  BP:  (!) 147/94 110/65 118/76  Pulse: 78 (!) 110 72 80  Resp: 16 (!) 27 12 12   Temp:  97.8 F (36.6 C) 97.9 F (36.6 C) (!) 97.5 F (36.4 C)  TempSrc:  Oral Oral Oral  SpO2: 97% 97% 95% 97%  Weight:   126.8 kg    General: Pt is alert, awake, not in acute distress Cardiovascular: Slightly tachycardic, S1/S2 +, no rubs, no gallops Respiratory: Diminished bilaterally, no wheezing, no rhonchi; Unlabored breathing and not wearing supplemental O2 via [place] Abdominal: Soft, NT, Distended due to body habitus, bowel sounds + Extremities: Trace LE Edema and wearing compressin stockings, no cyanosis  The results of significant diagnostics from this hospitalization (including imaging, microbiology, ancillary and laboratory) are listed below for reference.    Microbiology: Recent Results (from the past 240 hour(s))  MRSA PCR Screening     Status: None   Collection Time: 05/11/18 10:11 PM  Result Value Ref Range Status   MRSA by PCR NEGATIVE NEGATIVE Final    Comment:        The GeneXpert MRSA Assay (FDA approved for NASAL specimens only), is one component of a comprehensive MRSA colonization surveillance program. It is not intended to diagnose MRSA infection nor to guide or monitor treatment for MRSA infections. Performed at [facility], [place] [address], [place], [place] [address]   Novel Coronavirus, NAA (hospital order; send-out to ref lab)     Status: Abnormal   Collection Time: 05/11/18 11:11 PM  Result Value Ref Range Status   SARS-CoV-2, NAA DETECTED (A) NOT DETECTED Final    Comment: Positive (Detected) results are indicative of active infection with SARS CoV 2. A positive result does not rule  out bacterial infection or coinfection with other viruses. Detection of SARS CoV 2 viral RNA may not indicate that SARS CoV 2 is the causative agent for clinical symptoms. Positive and negative predictive values of testing are highly dependent on prevalence. False positive test results are more likely when prevalence is moderate to low. RESULT CALLED TO, READ BACK BY AND VERIFIED WITH: J. BISHOP RN 05/14/2018 1650 BEAMJ (NOTE) The expected result is Negative (Not Detected). The SARS CoV 2 test is intended for the presumptive qualitative  detection of nucleic acid from SARS CoV 2 in upper and lower  respiratory specimens. Testing methodology is real time RT PCR. Test results must be correlated with clinical presentation and  evaluated in the context of other laboratory and epidemiologic data.  Test performance can be affected because the epidemiology and  clinical  spectrum of infection caused by SARS CoV 2 is not fully  known. For example, the optimum types of specimens to collect and  when during the course of infection these specimens are most likely  to contain detectable viral RNA may not be known. This test has not been Food and Drug Administration (FDA) cleared or  approved and has been authorized by FDA under an Emergency Use  Authorization (EUA). The test is only authorized for the duration of  the declaration that circumstances exist justifying the authorization  of emergency use of in vitro diagnostic tests for detection and or  diagnosis of SARS CoV 2 under Section 564(b)(1) of the Act, 21 U.S.C.  section [phone] 3(b)(1), unless the authorization is terminated or  revoked sooner. [facility] is certified under the  Clinical Laboratory Improvement Amendments of 1988 (CLIA), 42 U.S.C.  section [phone], to perform high complexity tests. Performed at [facility] [id number], [address], [place], [place]  [address] Laboratory Director: Loleta Books, MD Fact Sheet for Healthcare Providers  BankingDealers.co.za Fact Sheet for Patients  StrictlyIdeas.no    Coronavirus Source NASOPHARYNGEAL  Final    Comment: Performed at [facility], [address] [place], [place] [address]  Respiratory Panel by PCR     Status: None   Collection Time: 05/11/18 11:13 PM  Result Value Ref Range Status   Adenovirus NOT DETECTED NOT DETECTED Final   Coronavirus 229E NOT DETECTED NOT DETECTED Final    Comment: (NOTE) The Coronavirus on the Respiratory Panel, DOES NOT test for the novel  Coronavirus (2019 nCoV)    Coronavirus HKU1 NOT DETECTED NOT DETECTED Final   Coronavirus NL63 NOT DETECTED NOT DETECTED Final   Coronavirus OC43 NOT DETECTED NOT DETECTED Final   Metapneumovirus NOT DETECTED NOT DETECTED Final   Rhinovirus / Enterovirus NOT DETECTED NOT DETECTED Final   Influenza A NOT DETECTED NOT DETECTED Final   Influenza B NOT DETECTED NOT DETECTED Final   Parainfluenza Virus 1 NOT DETECTED NOT DETECTED Final   Parainfluenza Virus 2 NOT DETECTED NOT DETECTED Final   Parainfluenza Virus 3 NOT DETECTED NOT DETECTED Final   Parainfluenza Virus 4 NOT DETECTED NOT DETECTED Final   Respiratory Syncytial Virus NOT DETECTED NOT DETECTED Final   Bordetella pertussis NOT DETECTED NOT DETECTED Final   Chlamydophila pneumoniae NOT DETECTED NOT DETECTED Final   Mycoplasma pneumoniae NOT DETECTED NOT DETECTED Final    Comment: Performed at [facility], [place]. [address] [place],  [address]    Labs: BNP (last 3 results) No results for input(s): BNP in the last 8760 hours. Basic Metabolic Panel: Recent Labs  Lab 05/13/18 1119  05/14/18 0505  05/15/18 0617  05/15/18 2314 05/16/18 0255 05/16/18 0700 05/16/18 1035 05/17/18 0806  NA 120*   < > 121*   < > 124*   < > 128* 128* 128* 127* 130*  K 3.9  --  4.0  --  3.7  --   --   --  4.1  --  4.4   CL 88*  --  91*  --  91*  --   --   --  91*  --  93*  CO2 22  --  22  --  21*  --   --   --  25  --  25  GLUCOSE 106*  --  91  --  93  --   --   --  101*  --  112*  BUN 11  --  10  --  10  --   --   --  11  --  14  CREATININE 1.11  --  1.13  --  1.06  --   --   --  1.22  --  1.23  CALCIUM 8.0*  --  7.9*  --  8.5*  --   --   --  9.0  --  9.4  MG 1.8  --  1.8  --  1.7  --   --   --  1.7  --  2.2  PHOS 2.7  --  2.7  --  3.3  --   --   --  3.7  --  4.1   < > = values in this interval not displayed.   Liver Function Tests: Recent Labs  Lab 05/13/18 1119 05/14/18 0505 05/15/18 0617 05/16/18 0700 05/17/18 0806  AST 70* 48* 42* 42* 42*  ALT 86* 70* 66* 69* 72*  ALKPHOS 57 52 50 55 62  BILITOT 0.7 0.6 0.5 0.7 0.9  PROT 5.9* 5.3* 5.3* 5.9* 6.7  ALBUMIN 3.2* 2.9* 2.9* 3.2* 3.6   No results for input(s): LIPASE, AMYLASE in the last 168 hours. No results for input(s): AMMONIA in the last 168 hours. CBC: Recent Labs  Lab 05/13/18 1119 05/14/18 0505 05/15/18 0617 05/16/18 0700 05/17/18 0806  WBC 6.9 6.3 6.5 8.7 9.9  NEUTROABS 5.2 4.2 4.2 6.0 7.1  HGB 12.8* 11.8* 12.8* 13.6 14.5  HCT 36.8* 34.1* 36.0* 39.0 40.4  MCV 85.2 86.5 84.9 85.9 87.3  PLT 200 192 215 274 367   Cardiac Enzymes: Recent Labs  Lab 05/12/18 0035 05/14/18 0505 05/15/18 1002 05/16/18 0700  CKTOTAL 826* 180 119  --   TROPONINI  --   --   --  <0.03   BNP: Invalid input(s): POCBNP CBG: No results for input(s): GLUCAP in the last 168 hours. D-Dimer Recent Labs    05/16/18 0700 05/17/18 0806  DDIMER 0.74* 0.55*   Hgb A1c No results for input(s): HGBA1C in the last 72 hours. Lipid Profile No results for input(s): CHOL, HDL, LDLCALC, TRIG, CHOLHDL, LDLDIRECT in the last 72 hours. Thyroid function studies No results for input(s): TSH, T4TOTAL, T3FREE, THYROIDAB in the last 72 hours.  Invalid input(s): FREET3 Anemia work up Recent Labs    05/16/18 0700 05/17/18 0806  FERRITIN 281 281    Urinalysis No results found for: COLORURINE, APPEARANCEUR, [place], [place], GLUCOSEU, [place], [place], [place], [place], UROBILINOGEN, NITRITE, LEUKOCYTESUR Sepsis Labs Invalid input(s): PROCALCITONIN,  WBC,  LACTICIDVEN Microbiology Recent Results (from the past 240 hour(s))  MRSA PCR Screening     Status: None   Collection Time: 05/11/18 10:11 PM  Result Value Ref Range Status   MRSA by PCR NEGATIVE NEGATIVE Final    Comment:        The GeneXpert MRSA Assay (FDA approved for NASAL specimens only), is one component of a comprehensive MRSA colonization surveillance program. It is not intended to diagnose MRSA infection nor to guide or monitor treatment for MRSA infections. Performed at [facility], [place] [address] [place], [place] [address]   Novel Coronavirus, NAA (hospital order; send-out to ref lab)     Status: Abnormal   Collection Time: 05/11/18 11:11 PM  Result Value Ref Range Status   SARS-CoV-2, NAA DETECTED (A) NOT DETECTED Final    Comment: Positive (Detected) results are indicative of active infection with SARS CoV 2. A positive result does not rule out bacterial infection or coinfection  with other viruses. Detection of SARS CoV 2 viral RNA may not indicate that SARS CoV 2 is the causative agent for clinical symptoms. Positive and negative predictive values of testing are highly dependent on prevalence. False positive test results are more likely when prevalence is moderate to low. RESULT CALLED TO, READ BACK BY AND VERIFIED WITH: J. BISHOP RN 05/14/2018 1650 BEAMJ (NOTE) The expected result is Negative (Not Detected). The SARS CoV 2 test is intended for the presumptive qualitative  detection of nucleic acid from SARS CoV 2 in upper and lower  respiratory specimens. Testing methodology is real time RT PCR. Test results must be correlated with clinical presentation and  evaluated in the context of other laboratory and epidemiologic data.  Test  performance can be affected because the epidemiology and  clinical  spectrum of infection caused by SARS CoV 2 is not fully  known. For example, the optimum types of specimens to collect and  when during the course of infection these specimens are most likely  to contain detectable viral RNA may not be known. This test has not been Food and Drug Administration (FDA) cleared or  approved and has been authorized by FDA under an Emergency Use  Authorization (EUA). The test is only authorized for the duration of  the declaration that circumstances exist justifying the authorization  of emergency use of in vitro diagnostic tests for detection and or  diagnosis of SARS CoV 2 under Section 564(b)(1) of the Act, 21 U.S.C.  section [phone] 3(b)(1), unless the authorization is terminated or  revoked sooner. [facility] is certified under the  Clinical Laboratory Improvement Amendments of 1988 (CLIA), 42 U.S.C.  section [id number], to perform high complexity tests. Performed at [facility] [id number], [place], [place] [address] Laboratory Director: Loleta Books, MD Fact Sheet for Healthcare Providers  BankingDealers.co.za Fact Sheet for Patients  StrictlyIdeas.no    Coronavirus Source NASOPHARYNGEAL  Final    Comment: Performed at [facility], [address], [place], [place] [address]  Respiratory Panel by PCR     Status: None   Collection Time: 05/11/18 11:13 PM  Result Value Ref Range Status   Adenovirus NOT DETECTED NOT DETECTED Final   Coronavirus 229E NOT DETECTED NOT DETECTED Final    Comment: (NOTE) The Coronavirus on the Respiratory Panel, DOES NOT test for the novel  Coronavirus (2019 nCoV)    Coronavirus HKU1 NOT DETECTED NOT DETECTED Final   Coronavirus NL63 NOT DETECTED NOT DETECTED Final   Coronavirus OC43 NOT DETECTED NOT DETECTED Final    Metapneumovirus NOT DETECTED NOT DETECTED Final   Rhinovirus / Enterovirus NOT DETECTED NOT DETECTED Final   Influenza A NOT DETECTED NOT DETECTED Final   Influenza B NOT DETECTED NOT DETECTED Final   Parainfluenza Virus 1 NOT DETECTED NOT DETECTED Final   Parainfluenza Virus 2 NOT DETECTED NOT DETECTED Final   Parainfluenza Virus 3 NOT DETECTED NOT DETECTED Final   Parainfluenza Virus 4 NOT DETECTED NOT DETECTED Final   Respiratory Syncytial Virus NOT DETECTED NOT DETECTED Final   Bordetella pertussis NOT DETECTED NOT DETECTED Final   Chlamydophila pneumoniae NOT DETECTED NOT DETECTED Final   Mycoplasma pneumoniae NOT DETECTED NOT DETECTED Final    Comment: Performed at [facility], [place], [place], [place] [address]   Time coordinating discharge: 35 minutes  SIGNED:  Kerney Elbe, DO [facility] 05/17/2018, 11:34 AM Pager  is on AMION  If 7PM-7AM, please contact night-coverage [email or web address] Password [email or web address]

## 2018-05-17 NOTE — Progress Notes (Signed)
Discussed with the patient and all questioned fully answered. He will call me if any problems arise.  IV removed. Telemetry removed.   Pt agreeable to self quarantine through 05/25/18 (positive for covid on 05/11/18). States wife has been self quarantining already. Given discharge packet which included COVID19 precautions and how to protect family members in the home.   Loel Dubonnet, RN

## 2018-05-27 DIAGNOSIS — I447 Left bundle-branch block, unspecified: Secondary | ICD-10-CM

## 2018-05-27 HISTORY — DX: Left bundle-branch block, unspecified: I44.7

## 2018-06-15 ENCOUNTER — Telehealth: Payer: Self-pay | Admitting: Cardiology

## 2018-06-15 NOTE — Telephone Encounter (Signed)
Virtual Visit Pre-Appointment Phone Call  "(Name), I am calling you today to discuss your upcoming appointment. We are currently trying to limit exposure to the virus that causes COVID-19 by seeing patients at home rather than in the office."  1. "What is the BEST phone number to call the day of the visit?" - include this in appointment notes  2. Do you have or have access to (through a family member/friend) a smartphone with video capability that we can use for your visit?" a. If yes - list this number in appt notes as cell (if different from BEST phone #) and list the appointment type as a VIDEO visit in appointment notes b. If no - list the appointment type as a PHONE visit in appointment notes  3. Confirm consent - "In the setting of the current Covid19 crisis, you are scheduled for a (phone or video) visit with your provider on (date) at (time).  Just as we do with many in-office visits, in order for you to participate in this visit, we must obtain consent.  If you'd like, I can send this to your mychart (if signed up) or email for you to review.  Otherwise, I can obtain your verbal consent now.  All virtual visits are billed to your insurance company just like a normal visit would be.  By agreeing to a virtual visit, we'd like you to understand that the technology does not allow for your provider to perform an examination, and thus may limit your provider's ability to fully assess your condition. If your provider identifies any concerns that need to be evaluated in person, we will make arrangements to do so.  Finally, though the technology is pretty good, we cannot assure that it will always work on either your or our end, and in the setting of a video visit, we may have to convert it to a phone-only visit.  In either situation, we cannot ensure that we have a secure connection.  Are you willing to proceed?" STAFF: Did the patient verbally acknowledge consent to telehealth visit? Document  YES/NO here: Yes  4. Advise patient to be prepared - "Two hours prior to your appointment, go ahead and check your blood pressure, pulse, oxygen saturation, and your weight (if you have the equipment to check those) and write them all down. When your visit starts, your provider will ask you for this information. If you have an Apple Watch or Kardia device, please plan to have heart rate information ready on the day of your appointment. Please have a pen and paper handy nearby the day of the visit as well."  5. Give patient instructions for MyChart download to smartphone OR [email or web address] as below if video visit (depending on what platform provider is using)  6. Inform patient they will receive a phone call 15 minutes prior to their appointment time (may be from unknown caller ID) so they should be prepared to answer    TELEPHONE CALL NOTE  Raymond Alvarez has been deemed a candidate for a follow-up tele-health visit to limit community exposure during the Covid-19 pandemic. I spoke with the patient via phone to ensure availability of phone/video source, confirm preferred email & phone number, and discuss instructions and expectations.  I reminded Raymond Alvarez to be prepared with any vital sign and/or heart rhythm information that could potentially be obtained via home monitoring, at the time of his visit. I reminded Raymond Alvarez to expect a phone call prior to his visit.  Raymond Alvarez 06/15/2018 1:12 PM   INSTRUCTIONS FOR DOWNLOADING THE MYCHART APP TO SMARTPHONE  - The patient must first make sure to have activated MyChart and know their login information - If Apple, go to [employer] and type in MyChart in the search bar and download the app. If Android, ask patient to go to [employer] and type in [place] in the search bar and download the app. The app is free but as with any other app downloads, their phone may require them to verify saved payment information or Apple/Android  password.  - The patient will need to then log into the app with their MyChart username and password, and select [facility] as their healthcare provider to link the account. When it is time for your visit, go to the MyChart app, find appointments, and click Begin Video Visit. Be sure to Select Allow for your device to access the Microphone and Camera for your visit. You will then be connected, and your provider will be with you shortly.  **If they have any issues connecting, or need assistance please contact MyChart service desk [phone])**  **If using a computer, in order to ensure the best quality for their visit they will need to use either of the following Internet Browsers: [employer], or Google Chrome**  IF USING DOXIMITY or DOXY.ME - The patient will receive a link just prior to their visit by text.     FULL LENGTH CONSENT FOR TELE-HEALTH VISIT   I hereby voluntarily request, consent and authorize [facility] and its employed or contracted physicians, physician assistants, nurse practitioners or other licensed health care professionals (the Practitioner), to provide me with telemedicine health care services (the Services") as deemed necessary by the treating Practitioner. I acknowledge and consent to receive the Services by the Practitioner via telemedicine. I understand that the telemedicine visit will involve communicating with the Practitioner through live audiovisual communication technology and the disclosure of certain medical information by electronic transmission. I acknowledge that I have been given the opportunity to request an in-person assessment or other available alternative prior to the telemedicine visit and am voluntarily participating in the telemedicine visit.  I understand that I have the right to withhold or withdraw my consent to the use of telemedicine in the course of my care at any time, without affecting my right to future care or treatment,  and that the Practitioner or I may terminate the telemedicine visit at any time. I understand that I have the right to inspect all information obtained and/or recorded in the course of the telemedicine visit and may receive copies of available information for a reasonable fee.  I understand that some of the potential risks of receiving the Services via telemedicine include:   Delay or interruption in medical evaluation due to technological equipment failure or disruption;  Information transmitted may not be sufficient (e.g. poor resolution of images) to allow for appropriate medical decision making by the Practitioner; and/or   In rare instances, security protocols could fail, causing a breach of personal health information.  Furthermore, I acknowledge that it is my responsibility to provide information about my medical history, conditions and care that is complete and accurate to the best of my ability. I acknowledge that Practitioner's advice, recommendations, and/or decision may be based on factors not within their control, such as incomplete or inaccurate data provided by me or distortions of diagnostic images or specimens that may result from electronic transmissions. I understand that the  practice of medicine is not an Chief Strategy Officer and that Practitioner makes no warranties or guarantees regarding treatment outcomes. I acknowledge that I will receive a copy of this consent concurrently upon execution via email to the email address I last provided but may also request a printed copy by calling the office of [facility].    I understand that my insurance will be billed for this visit.   I have read or had this consent read to me.  I understand the contents of this consent, which adequately explains the benefits and risks of the Services being provided via telemedicine.   I have been provided ample opportunity to ask questions regarding this consent and the Services and have had my questions  answered to my satisfaction.  I give my informed consent for the services to be provided through the use of telemedicine in my medical care  By participating in this telemedicine visit I agree to the above.

## 2018-06-17 DIAGNOSIS — R7303 Prediabetes: Secondary | ICD-10-CM

## 2018-06-17 DIAGNOSIS — K7581 Nonalcoholic steatohepatitis (NASH): Secondary | ICD-10-CM

## 2018-06-17 DIAGNOSIS — F330 Major depressive disorder, recurrent, mild: Secondary | ICD-10-CM | POA: Insufficient documentation

## 2018-06-17 HISTORY — DX: Nonalcoholic steatohepatitis (NASH): K75.81

## 2018-06-17 HISTORY — DX: Major depressive disorder, recurrent, mild: F33.0

## 2018-06-17 HISTORY — DX: Prediabetes: R73.03

## 2018-06-25 ENCOUNTER — Other Ambulatory Visit: Payer: Self-pay

## 2018-06-25 ENCOUNTER — Telehealth (INDEPENDENT_AMBULATORY_CARE_PROVIDER_SITE_OTHER): Payer: BC Managed Care – PPO | Admitting: Cardiology

## 2018-06-25 ENCOUNTER — Encounter: Payer: Self-pay | Admitting: Cardiology

## 2018-06-25 VITALS — BP 110/70 | Ht 74.0 in | Wt 280.0 lb

## 2018-06-25 DIAGNOSIS — I447 Left bundle-branch block, unspecified: Secondary | ICD-10-CM

## 2018-06-25 DIAGNOSIS — R0609 Other forms of dyspnea: Secondary | ICD-10-CM

## 2018-06-25 DIAGNOSIS — R0600 Dyspnea, unspecified: Secondary | ICD-10-CM

## 2018-06-25 DIAGNOSIS — I10 Essential (primary) hypertension: Secondary | ICD-10-CM

## 2018-06-25 DIAGNOSIS — Z8619 Personal history of other infectious and parasitic diseases: Secondary | ICD-10-CM

## 2018-06-25 DIAGNOSIS — Z8616 Personal history of COVID-19: Secondary | ICD-10-CM

## 2018-06-25 DIAGNOSIS — R002 Palpitations: Secondary | ICD-10-CM

## 2018-06-25 HISTORY — DX: Dyspnea, unspecified: R06.00

## 2018-06-25 HISTORY — DX: Other forms of dyspnea: R06.09

## 2018-06-25 NOTE — Patient Instructions (Signed)
Medication Instructions:  Your physician recommends that you continue on your current medications as directed. Please refer to the Current Medication list given to you today.  If you need a refill on your cardiac medications before your next appointment, please call your pharmacy.   Lab work: None.  If you have labs (blood work) drawn today and your tests are completely normal, you will receive your results only by: . MyChart Message (if you have MyChart) OR . A paper copy in the mail If you have any lab test that is abnormal or we need to change your treatment, we will call you to review the results.  Testing/Procedures: Your physician has requested that you have an echocardiogram. Echocardiography is a painless test that uses sound waves to create images of your heart. It provides your doctor with information about the size and shape of your heart and how well your heart's chambers and valves are working. This procedure takes approximately one hour. There are no restrictions for this procedure.    Follow-Up: At [facility], you and your health needs are our priority.  As part of our continuing mission to provide you with exceptional heart care, we have created designated Provider Care Teams.  These Care Teams include your primary Cardiologist (physician) and Advanced Practice Providers (APPs -  Physician Assistants and Nurse Practitioners) who all work together to provide you with the care you need, when you need it. You will need a follow up appointment in 1 months.  Please call our office 2 months in advance to schedule this appointment.  You may see No primary care provider on file. or another member of our [facility] Provider Team in [place]: Brian Munley, MD . Rajan Revankar, MD  Any Other Special Instructions Will Be Listed Below (If Applicable).   Echocardiogram An echocardiogram is a procedure that uses painless sound waves (ultrasound) to produce an image of the heart.  Images from an echocardiogram can provide important information about:  Signs of coronary artery disease (CAD).  Aneurysm detection. An aneurysm is a weak or damaged part of an artery wall that bulges out from the normal force of blood pumping through the body.  Heart size and shape. Changes in the size or shape of the heart can be associated with certain conditions, including heart failure, aneurysm, and CAD.  Heart muscle function.  Heart valve function.  Signs of a past heart attack.  Fluid buildup around the heart.  Thickening of the heart muscle.  A tumor or infectious growth around the heart valves. Tell a health care provider about:  Any allergies you have.  All medicines you are taking, including vitamins, herbs, eye drops, creams, and over-the-counter medicines.  Any blood disorders you have.  Any surgeries you have had.  Any medical conditions you have.  Whether you are pregnant or may be pregnant. What are the risks? Generally, this is a safe procedure. However, problems may occur, including:  Allergic reaction to dye (contrast) that may be used during the procedure. What happens before the procedure? No specific preparation is needed. You may eat and drink normally. What happens during the procedure?   An IV tube may be inserted into one of your veins.  You may receive contrast through this tube. A contrast is an injection that improves the quality of the pictures from your heart.  A gel will be applied to your chest.  A wand-like tool (transducer) will be moved over your chest. The gel will help to   transmit the sound waves from the transducer.  The sound waves will harmlessly bounce off of your heart to allow the heart images to be captured in real-time motion. The images will be recorded on a computer. The procedure may vary among health care providers and hospitals. What happens after the procedure?  You may return to your normal, everyday life,  including diet, activities, and medicines, unless your health care provider tells you not to do that. Summary  An echocardiogram is a procedure that uses painless sound waves (ultrasound) to produce an image of the heart.  Images from an echocardiogram can provide important information about the size and shape of your heart, heart muscle function, heart valve function, and fluid buildup around your heart.  You do not need to do anything to prepare before this procedure. You may eat and drink normally.  After the echocardiogram is completed, you may return to your normal, everyday life, unless your health care provider tells you not to do that. This information is not intended to replace advice given to you by your health care provider. Make sure you discuss any questions you have with your health care provider. Document Released: 01/18/2000 Document Revised: 02/23/2016 Document Reviewed: 02/23/2016 Elsevier Interactive Patient Education  2019 Elsevier Inc.    

## 2018-06-25 NOTE — Progress Notes (Signed)
Virtual Visit via Video Note   This visit type was conducted due to national recommendations for restrictions regarding the COVID-19 Pandemic (e.g. social distancing) in an effort to limit this patient's exposure and mitigate transmission in our community.  Due to his co-morbid illnesses, this patient is at least at moderate risk for complications without adequate follow up.  This format is felt to be most appropriate for this patient at this time.  All issues noted in this document were discussed and addressed.  A limited physical exam was performed with this format.  Please refer to the patient's chart for his consent to telehealth for [facility].  Evaluation Performed:  Follow-up visit  This visit type was conducted due to national recommendations for restrictions regarding the COVID-19 Pandemic (e.g. social distancing).  This format is felt to be most appropriate for this patient at this time.  All issues noted in this document were discussed and addressed.  No physical exam was performed (except for noted visual exam findings with Video Visits).  Please refer to the patient's chart (MyChart message for video visits and phone note for telephone visits) for the patient's consent to telehealth for [facility].  Date:  06/25/2018  ID: Raymond Alvarez, DOB 1955/12/29, MRN [id number]   Patient Location: [address] [place] [address]   Provider location:   [place] Office  PCP:  Raina Mina., MD  Cardiologist:  Jenne Campus, MD     Chief Complaint: I have abnormal EKG and I just recover from COVID-19 infection  History of Present Illness:    Raymond Alvarez is a 62 y.o. male  who presents via audio/video conferencing for a telehealth visit today.  Ratable and diabetes, hypertension, anxiety.  Recently suffered from coronavirus infection he ended going to ICU in [place] spent about a week there likely did not require intubation and overall getting better  overall he been sick for about 3 weeks and now slowly gradually getting better.  Denies having any chest pain tightness squeezing pressure burning chest.  EKG done showed left bundle branch block which is a new finding.  It looks like he did not have full echocardiogram done in [facility].  Is difficult to judge his symptoms right now again he is recovering from COVID-19 infection.  Shortness of breath fatigue tiredness but no swelling of lower extremities.  He never had any heart trouble.  He does not have family history of premature coronary artery disease.  He does not have diabetes.  Apparently his cholesterol was fine.  He is not exercising on the regular basis his living sedentary lifestyle.  Never had any syncope or passing out.   The patient does not have symptoms concerning for COVID-19 infection (fever, chills, cough, or new SHORTNESS OF BREATH).    Prior CV studies:   The following studies were reviewed today:  EKG showed sinus rhythm with a left bundle branch block.     Past Medical History:  Diagnosis Date  . Anemia   . Anxiety   . Asthma   . Chronic kidney disease   . Colon polyp   . Depression   . Diverticulitis   . GERD (gastroesophageal reflux disease)   . Gout   . Hypertension   . Prostate cancer ([place])   . Seasonal allergies   . Wears glasses     Past Surgical History:  Procedure Laterality Date  . APPENDECTOMY  1970  . COLONOSCOPY  09/24/2016   Colonic  polyp status post polypectomy. Tubular adenoma.   . ESOPHAGOGASTRODUODENOSCOPY  12/10/2016   Small hiatal hernia. Mild gastritis. Gastric polyps status post polypectomy x 4   . MASS EXCISION Left 12/15/2013   Procedure: EXCISION MUCOID TUMOR LEFT INDEX DEBRIDEMENT DISTAL INTERPHALANGEAL JOINT LEFT INDEX FINGER;  Surgeon: Daryll Brod, MD;  Location: [facility];  Service: Orthopedics;  Laterality: Left;  . PROSTATECTOMY  2015  . TONSILLECTOMY       Current Meds  Medication Sig  . albuterol  (PROVENTIL HFA;VENTOLIN HFA) 108 (90 Base) MCG/ACT inhaler Inhale 2 puffs into the lungs every 4 (four) hours as needed for wheezing or shortness of breath.  . allopurinol (ZYLOPRIM) 300 MG tablet Take 300 mg by mouth daily.  . cyanocobalamin (,VITAMIN B-12,) 1000 MCG/ML injection Inject 1,000 mcg into the muscle every 30 (thirty) days.  Marland Kitchen dicyclomine (BENTYL) 10 MG capsule Take 1 capsule (10 mg total) by mouth 4 (four) times daily -  before meals and at bedtime.  Marland Kitchen guaiFENesin (MUCINEX) 600 MG 12 hr tablet Take 2 tablets (1,200 mg total) by mouth 2 (two) times daily.  . hyoscyamine (LEVSIN SL) 0.125 MG SL tablet Place 1 tablet (0.125 mg total) under the tongue every 4 (four) hours as needed. (Patient taking differently: Place 0.125 mg under the tongue every 4 (four) hours as needed for cramping. )  . lisinopril-hydrochlorothiazide (ZESTORETIC) 20-25 MG tablet Take 1 tablet by mouth daily.  Marland Kitchen LORazepam (ATIVAN) 1 MG tablet Take 1 mg by mouth daily as needed for anxiety.  . methocarbamol (ROBAXIN) 750 MG tablet Take 750 mg by mouth 4 (four) times daily.  . Olopatadine HCl (PAZEO) 0.7 % SOLN Place 1 drop into both eyes daily.  Marland Kitchen omeprazole (PRILOSEC) 20 MG capsule Take 20 mg by mouth daily.  Marland Kitchen spironolactone (ALDACTONE) 25 MG tablet Take 1 tablet by mouth daily.  . vitamin C (VITAMIN C) 500 MG tablet Take 1 tablet (500 mg total) by mouth daily.  Marland Kitchen vortioxetine HBr (TRINTELLIX) 20 MG TABS tablet Take 20 mg by mouth daily.   Marland Kitchen zinc sulfate 220 (50 Zn) MG capsule Take 1 capsule (220 mg total) by mouth daily.      Family History: The patient's family history includes Breast cancer in his mother; Prostate cancer in his father and maternal grandfather.   ROS:   Please see the history of present illness.     All other systems reviewed and are negative.   Labs/Other Tests and Data Reviewed:     Recent Labs: 05/12/2018: TSH 0.939 05/17/2018: ALT 72; BUN 14; Creatinine, Ser 1.23; Hemoglobin 14.5;  Magnesium 2.2; Platelets 367; Potassium 4.4; Sodium 130  Recent Lipid Panel No results found for: CHOL, TRIG, HDL, CHOLHDL, VLDL, LDLCALC, LDLDIRECT    Exam:    Vital Signs:  BP 110/70   Ht 6\' 2"  (1.88 m)   Wt 280 lb (127 kg)   BMI 35.95 kg/m     Wt Readings from Last 3 Encounters:  06/25/18 280 lb (127 kg)  05/17/18 279 lb 8.7 oz (126.8 kg)  10/07/17 294 lb 6 oz (133.5 kg)     Well nourished, well developed in no acute distress. Alert awake and x3 quite cheerful talking to me over the phone.  There is no swelling of lower extremities no JVD.  He is asymptomatic at the time of my interview.  Diagnosis for this visit:   1. Benign essential hypertension   2. Palpitations   3. LBBB (left bundle branch  block)   4. History of 2019 novel coronavirus disease (COVID-19)   5. Dyspnea on exertion      ASSESSMENT & PLAN:    1.  Left bundle branch block which is incidental and new discovery.  I will ask him to have an echocardiogram done to assess left ventricular ejection fraction.  I told him most in the future will most likely end up doing stress test make sure there is no inducible ischemia.  Of course future management will depend on those tests. 2.  Benign essential hypertension well controlled with appropriate medications which I will continue. 3.  Dyspnea on exertion of course difficult to judge if it is related to the heart or to recent coronavirus infection.  This is something we will follow-up closely. 4.  History of coronavirus infection.  Recovering.  COVID-19 Education: The signs and symptoms of COVID-19 were discussed with the patient and how to seek care for testing (follow up with PCP or arrange E-visit).  The importance of social distancing was discussed today.  Patient Risk:   After full review of this patients clinical status, I feel that they are at least moderate risk at this time.  Time:   Today, I have spent 27 minutes with the patient with telehealth  technology discussing pt health issues.  I spent 5 minutes reviewing her chart before the visit.  Visit was finished at 11:30 AM.    Medication Adjustments/Labs and Tests Ordered: Current medicines are reviewed at length with the patient today.  Concerns regarding medicines are outlined above.  Orders Placed This Encounter  Procedures  . ECHOCARDIOGRAM COMPLETE   Medication changes: No orders of the defined types were placed in this encounter.    Disposition: Follow-up in 1 month.  Echocardiogram will be done  Signed, Park Liter, MD, [facility] 06/25/2018 12:02 PM    [place]

## 2018-07-12 ENCOUNTER — Ambulatory Visit (INDEPENDENT_AMBULATORY_CARE_PROVIDER_SITE_OTHER): Payer: BC Managed Care – PPO

## 2018-07-12 ENCOUNTER — Other Ambulatory Visit: Payer: BC Managed Care – PPO

## 2018-07-12 ENCOUNTER — Other Ambulatory Visit: Payer: Self-pay

## 2018-07-12 DIAGNOSIS — R002 Palpitations: Secondary | ICD-10-CM | POA: Diagnosis not present

## 2018-07-12 DIAGNOSIS — I10 Essential (primary) hypertension: Secondary | ICD-10-CM

## 2018-07-12 NOTE — Progress Notes (Signed)
2D Echocardiogram performed  07/12/18 Joseph Taler Kushner RDCS, RVT 

## 2018-07-15 ENCOUNTER — Other Ambulatory Visit: Payer: Self-pay

## 2018-07-15 ENCOUNTER — Telehealth (INDEPENDENT_AMBULATORY_CARE_PROVIDER_SITE_OTHER): Payer: BC Managed Care – PPO | Admitting: Cardiology

## 2018-07-15 ENCOUNTER — Encounter: Payer: Self-pay | Admitting: Cardiology

## 2018-07-15 VITALS — BP 128/88 | Wt 289.0 lb

## 2018-07-15 DIAGNOSIS — I10 Essential (primary) hypertension: Secondary | ICD-10-CM | POA: Diagnosis not present

## 2018-07-15 DIAGNOSIS — R0600 Dyspnea, unspecified: Secondary | ICD-10-CM | POA: Diagnosis not present

## 2018-07-15 DIAGNOSIS — I428 Other cardiomyopathies: Secondary | ICD-10-CM | POA: Insufficient documentation

## 2018-07-15 DIAGNOSIS — I420 Dilated cardiomyopathy: Secondary | ICD-10-CM | POA: Diagnosis not present

## 2018-07-15 DIAGNOSIS — I447 Left bundle-branch block, unspecified: Secondary | ICD-10-CM

## 2018-07-15 DIAGNOSIS — Z8619 Personal history of other infectious and parasitic diseases: Secondary | ICD-10-CM

## 2018-07-15 DIAGNOSIS — R0609 Other forms of dyspnea: Secondary | ICD-10-CM

## 2018-07-15 DIAGNOSIS — Z8616 Personal history of COVID-19: Secondary | ICD-10-CM

## 2018-07-15 HISTORY — DX: Dilated cardiomyopathy: I42.0

## 2018-07-15 MED ORDER — FUROSEMIDE 20 MG PO TABS: 20.0000 mg | ORAL_TABLET | Freq: Every day | ORAL | 1 refills | Status: DC

## 2018-07-15 MED ORDER — ENTRESTO 24-26 MG PO TABS: 1.0000 | ORAL_TABLET | Freq: Two times a day (BID) | ORAL | 1 refills | Status: DC

## 2018-07-15 NOTE — Progress Notes (Signed)
Virtual Visit via Video Note   This visit type was conducted due to national recommendations for restrictions regarding the COVID-19 Pandemic (e.g. social distancing) in an effort to limit this patient's exposure and mitigate transmission in our community.  Due to his co-morbid illnesses, this patient is at least at moderate risk for complications without adequate follow up.  This format is felt to be most appropriate for this patient at this time.  All issues noted in this document were discussed and addressed.  A limited physical exam was performed with this format.  Please refer to the patient's chart for his consent to telehealth for [facility].  Evaluation Performed:  Follow-up visit  This visit type was conducted due to national recommendations for restrictions regarding the COVID-19 Pandemic (e.g. social distancing).  This format is felt to be most appropriate for this patient at this time.  All issues noted in this document were discussed and addressed.  No physical exam was performed (except for noted visual exam findings with Video Visits).  Please refer to the patient's chart (MyChart message for video visits and phone note for telephone visits) for the patient's consent to telehealth for [facility].  Date:  07/15/2018  ID: Raymond Alvarez, DOB 21-Aug-1955, MRN [id number]   Patient Location: [address] [place] [address]   Provider location:   [place] Office  PCP:  Raina Mina., MD  Cardiologist:  Jenne Campus, MD     Chief Complaint: I am doing well  History of Present Illness:    Raymond Alvarez is a 63 y.o. male  who presents via audio/video conferencing for a telehealth visit today.  With past medical history significant for recent coronavirus infection.  He spent 1 week in the ICU.  Left bundle branch block, chronic kidney disease, essential hypertension.  I did echocardiogram on him to check left ventricular ejection fraction ejection  fraction Significantly diminished 30 to 35%.  The purpose of the visit is to talk about appropriate therapy for it.  Etiology of this phenomenon is unclear it could be viral could be coronary artery disease.  What we decided to do today is stop his lisinopril hydrochlorothiazide, I will put him on Entresto 2426 as well as Lasix 20 I stressed multiple times importance of stopping lisinopril for 2 days before he takes his Entresto he understood and he will comply I will talk to him next week and at that time hopefully I will be able to initiate beta-blockade.  He does have some wheezes therefore we may have some issue with beta-blockade.  In the future he will require evaluation for CAD.  I am afraid with his left bundle branch block the best option will be to proceed with cardiac catheterization.  It will depends on the kidney function.   The patient does not have symptoms concerning for COVID-19 infection (fever, chills, cough, or new SHORTNESS OF BREATH).    Prior CV studies:   The following studies were reviewed today:  Echocardiogram done on 12 July 2018 showed:  1. Moderate, fixed thrombus on the apical wall of the left ventricle.  2. The left ventricle has severely reduced systolic function, with an ejection fraction of 25-30%. The cavity size was mild to moderately dilated. There is moderately increased left ventricular wall thickness. Left ventricular diastolic Doppler  parameters are consistent with restrictive filling.  3. The right ventricle has normal systolic function. The cavity was normal. There is no increase in right ventricular wall  thickness.  4. Left atrial size was mild-moderately dilated.  5. The mitral valve is grossly normal.  6. The aortic valve is grossly normal. Aortic valve regurgitation was not assessed by color flow Doppler.     Past Medical History:  Diagnosis Date  . Anemia   . Anxiety   . Asthma   . Chronic kidney disease   . Colon polyp   . Depression   .  Diverticulitis   . GERD (gastroesophageal reflux disease)   . Gout   . Hypertension   . Prostate cancer ([place])   . Seasonal allergies   . Wears glasses     Past Surgical History:  Procedure Laterality Date  . APPENDECTOMY  1970  . COLONOSCOPY  09/24/2016   Colonic polyp status post polypectomy. Tubular adenoma.   . ESOPHAGOGASTRODUODENOSCOPY  12/10/2016   Small hiatal hernia. Mild gastritis. Gastric polyps status post polypectomy x 4   . MASS EXCISION Left 12/15/2013   Procedure: EXCISION MUCOID TUMOR LEFT INDEX DEBRIDEMENT DISTAL INTERPHALANGEAL JOINT LEFT INDEX FINGER;  Surgeon: Daryll Brod, MD;  Location: [facility];  Service: Orthopedics;  Laterality: Left;  . PROSTATECTOMY  2015  . TONSILLECTOMY       Current Meds  Medication Sig  . albuterol (PROVENTIL HFA;VENTOLIN HFA) 108 (90 Base) MCG/ACT inhaler Inhale 2 puffs into the lungs every 4 (four) hours as needed for wheezing or shortness of breath.  . allopurinol (ZYLOPRIM) 300 MG tablet Take 300 mg by mouth daily.  . cyanocobalamin (,VITAMIN B-12,) 1000 MCG/ML injection Inject 1,000 mcg into the muscle every 30 (thirty) days.  Marland Kitchen dicyclomine (BENTYL) 10 MG capsule Take 1 capsule (10 mg total) by mouth 4 (four) times daily -  before meals and at bedtime.  . ferrous sulfate 325 (65 FE) MG EC tablet Take 325 mg by mouth 3 (three) times a week.  Marland Kitchen guaiFENesin (MUCINEX) 600 MG 12 hr tablet Take 2 tablets (1,200 mg total) by mouth 2 (two) times daily.  . hyoscyamine (LEVSIN SL) 0.125 MG SL tablet Place 1 tablet (0.125 mg total) under the tongue every 4 (four) hours as needed. (Patient taking differently: Place 0.125 mg under the tongue every 4 (four) hours as needed for cramping. )  . lisinopril-hydrochlorothiazide (ZESTORETIC) 20-25 MG tablet Take 1 tablet by mouth daily.  Marland Kitchen LORazepam (ATIVAN) 1 MG tablet Take 1 mg by mouth daily as needed for anxiety.  . methocarbamol (ROBAXIN) 750 MG tablet Take 750 mg by mouth 4 (four)  times daily.  . Olopatadine HCl (PAZEO) 0.7 % SOLN Place 1 drop into both eyes daily.  Marland Kitchen omeprazole (PRILOSEC) 20 MG capsule Take 20 mg by mouth daily.  Marland Kitchen spironolactone (ALDACTONE) 25 MG tablet Take 1 tablet by mouth daily.  . vitamin C (VITAMIN C) 500 MG tablet Take 1 tablet (500 mg total) by mouth daily.  Marland Kitchen vortioxetine HBr (TRINTELLIX) 20 MG TABS tablet Take 20 mg by mouth daily.   Marland Kitchen zinc sulfate 220 (50 Zn) MG capsule Take 1 capsule (220 mg total) by mouth daily.      Family History: The patient's family history includes Breast cancer in his mother; Prostate cancer in his father and maternal grandfather.   ROS:   Please see the history of present illness.     All other systems reviewed and are negative.   Labs/Other Tests and Data Reviewed:     Recent Labs: 05/12/2018: TSH 0.939 05/17/2018: ALT 72; BUN 14; Creatinine, Ser 1.23; Hemoglobin 14.5; Magnesium 2.2;  Platelets 367; Potassium 4.4; Sodium 130  Recent Lipid Panel No results found for: CHOL, TRIG, HDL, CHOLHDL, VLDL, LDLCALC, LDLDIRECT    Exam:    Vital Signs:  BP 128/88   Wt 289 lb (131.1 kg)   BMI 37.11 kg/m     Wt Readings from Last 3 Encounters:  07/15/18 289 lb (131.1 kg)  06/25/18 280 lb (127 kg)  05/17/18 279 lb 8.7 oz (126.8 kg)     Well nourished, well developed in no acute distress. Alert awake at the time 3 quite cheerful doing well overall but described to have some exertional shortness of breath.  She does have chronic swelling of lower extremities with this but no worsening lately.  Diagnosis for this visit:   1. LBBB (left bundle branch block)   2. Dilated cardiomyopathy ([place])   3. Benign essential hypertension   4. Dyspnea on exertion   5. History of 2019 novel coronavirus disease (COVID-19)      ASSESSMENT & PLAN:    1.  Left bundle branch block noted. 2.  Dilated cardiomyopathy plan as outlined above. 3.  Benign essential hypertension blood pressure controlled continue present  management. 4.  Dyspnea on exertion most likely CHF related.  Plan as outlined above  COVID-19 Education: The signs and symptoms of COVID-19 were discussed with the patient and how to seek care for testing (follow up with PCP or arrange E-visit).  The importance of social distancing was discussed today.  Patient Risk:   After full review of this patients clinical status, I feel that they are at least moderate risk at this time.  Time:   Today, I have spent 19 minutes with the patient with telehealth technology discussing pt health issues.  I spent 5 minutes reviewing her chart before the visit.  Visit was finished at 10:47 AM.    Medication Adjustments/Labs and Tests Ordered: Current medicines are reviewed at length with the patient today.  Concerns regarding medicines are outlined above.  No orders of the defined types were placed in this encounter.  Medication changes: No orders of the defined types were placed in this encounter.    Disposition: Follow-up in 1 week  Signed, Park Liter, MD, [facility] 07/15/2018 10:48 AM    [place]

## 2018-07-15 NOTE — Addendum Note (Signed)
Addended by: Ashok Norris on: 07/15/2018 11:12 AM   Modules accepted: Orders

## 2018-07-15 NOTE — Patient Instructions (Signed)
Medication Instructions:  Your physician has recommended you make the following change in your medication:   STOP: Lisinopril-hctz   START: Entresto 24/26 mg twice daily on Sunday 07/18/2018 START: Lasix 20 mg daily tomorrow 07/16/2018  If you need a refill on your cardiac medications before your next appointment, please call your pharmacy.   Lab work: None.  If you have labs (blood work) drawn today and your tests are completely normal, you will receive your results only by: Marland Kitchen MyChart Message (if you have MyChart) OR . A paper copy in the mail If you have any lab test that is abnormal or we need to change your treatment, we will call you to review the results.  Testing/Procedures: None.   Follow-Up: At [facility], you and your health needs are our priority.  As part of our continuing mission to provide you with exceptional heart care, we have created designated Provider Care Teams.  These Care Teams include your primary Cardiologist (physician) and Advanced Practice Providers (APPs -  Physician Assistants and Nurse Practitioners) who all work together to provide you with the care you need, when you need it. You will need a follow up appointment in 1 weeks.  Please call our office 2 months in advance to schedule this appointment.  You may see No primary care provider on file. or another member of our [employer] Provider Team in [place]: Shirlee More, MD . Jyl Heinz, MD  Any Other Special Instructions Will Be Listed Below (If Applicable).  Sacubitril; Valsartan oral tablet What is this medicine? SACUBITRIL; VALSARTAN (sak UE bi tril; val SAR tan) is a combination of 2 drugs used to reduce the risk of death and hospitalizations in people with long-lasting heart failure. It is usually used with other medicines to treat heart failure. This medicine may be used for other purposes; ask your health care provider or pharmacist if you have questions. COMMON BRAND NAME(S):  Entresto What should I tell my health care provider before I take this medicine? They need to know if you have any of these conditions: -diabetes and take a medicine that contains aliskiren -kidney disease -liver disease -an unusual or allergic reaction to sacubitril; valsartan, drugs called angiotensin converting enzyme (ACE) inhibitors, angiotensin II receptor blockers (ARBs), other medicines, foods, dyes, or preservatives -pregnant or trying to get pregnant -breast-feeding How should I use this medicine? Take this medicine by mouth with a glass of water. Follow the directions on the prescription label. You can take it with or without food. If it upsets your stomach, take it with food. Take your medicine at regular intervals. Do not take it more often than directed. Do not stop taking except on your doctor's advice. Do not take this medicine for at least 36 hours before or after you take an ACE inhibitor medicine. Talk to your health care provider if you are not sure if you take an ACE inhibitor. Talk to your pediatrician regarding the use of this medicine in children. Special care may be needed. Overdosage: If you think you have taken too much of this medicine contact a poison control center or emergency room at once. NOTE: This medicine is only for you. Do not share this medicine with others. What if I miss a dose? If you miss a dose, take it as soon as you can. If it is almost time for next dose, take only that dose. Do not take double or extra doses. What may interact with this medicine? Do not take this  medicine with any of the following medicines: -aliskiren if you have diabetes -angiotensin-converting enzyme (ACE) inhibitors, like benazepril, captopril, enalapril, fosinopril, lisinopril, or ramipril This medicine may also interact with the following medicines: -angiotensin II receptor blockers (ARBs) like azilsartan, candesartan, eprosartan, irbesartan, losartan, olmesartan,  telmisartan, or valsartan -lithium -NSAIDS, medicines for pain and inflammation, like ibuprofen or naproxen -potassium-sparing diuretics like amiloride, spironolactone, and triamterene -potassium supplements This list may not describe all possible interactions. Give your health care provider a list of all the medicines, herbs, non-prescription drugs, or dietary supplements you use. Also tell them if you smoke, drink alcohol, or use illegal drugs. Some items may interact with your medicine. What should I watch for while using this medicine? Tell your doctor or healthcare professional if your symptoms do not start to get better or if they get worse. Do not become pregnant while taking this medicine. Women should inform their doctor if they wish to become pregnant or think they might be pregnant. There is a potential for serious side effects to an unborn child. Talk to your health care professional or pharmacist for more information. You may get dizzy. Do not drive, use machinery, or do anything that needs mental alertness until you know how this medicine affects you. Do not stand or sit up quickly, especially if you are an older patient. This reduces the risk of dizzy or fainting spells. Avoid alcoholic drinks; they can make you more dizzy. What side effects may I notice from receiving this medicine? Side effects that you should report to your doctor or health care professional as soon as possible: -allergic reactions like skin rash, itching or hives, swelling of the face, lips, or tongue -signs and symptoms of increased potassium like muscle weakness; chest pain; or fast, irregular heartbeat -signs and symptoms of kidney injury like trouble passing urine or change in the amount of urine -signs and symptoms of low blood pressure like feeling dizzy or lightheaded, or if you develop extreme fatigue Side effects that usually do not require medical attention (report to your doctor or health care  professional if they continue or are bothersome): -cough This list may not describe all possible side effects. Call your doctor for medical advice about side effects. You may report side effects to FDA at 1-800-FDA-1088. Where should I keep my medicine? Keep out of the reach of children. Store at room temperature between 15 and 30 degrees C (59 and 86 degrees F). Throw away any unused medicine after the expiration date. NOTE: This sheet is a summary. It may not cover all possible information. If you have questions about this medicine, talk to your doctor, pharmacist, or health care provider.  2019 Elsevier/Gold Standard (2015-03-07 13:54:19)   Furosemide tablets What is this medicine? FUROSEMIDE (fyoor [place] se mide) is a diuretic. It helps you make more urine and to lose salt and excess water from your body. This medicine is used to treat high blood pressure, and edema or swelling from heart, kidney, or liver disease. This medicine may be used for other purposes; ask your health care provider or pharmacist if you have questions. COMMON BRAND NAME(S): Active-Medicated Specimen Kit, Delone, Diuscreen, Lasix, RX Specimen Collection Kit, Specimen Collection Kit, URINX Medicated Specimen Collection What should I tell my health care provider before I take this medicine? They need to know if you have any of these conditions: -abnormal blood electrolytes -diarrhea or vomiting -gout -heart disease -kidney disease, small amounts of urine, or difficulty passing urine -liver disease -thyroid  disease -an unusual or allergic reaction to furosemide, sulfa drugs, other medicines, foods, dyes, or preservatives -pregnant or trying to get pregnant -breast-feeding How should I use this medicine? Take this medicine by mouth with a glass of water. Follow the directions on the prescription label. You may take this medicine with or without food. If it upsets your stomach, take it with food or milk. Do not take  your medicine more often than directed. Remember that you will need to pass more urine after taking this medicine. Do not take your medicine at a time of day that will cause you problems. Do not take at bedtime. Talk to your pediatrician regarding the use of this medicine in children. While this drug may be prescribed for selected conditions, precautions do apply. Overdosage: If you think you have taken too much of this medicine contact a poison control center or emergency room at once. NOTE: This medicine is only for you. Do not share this medicine with others. What if I miss a dose? If you miss a dose, take it as soon as you can. If it is almost time for your next dose, take only that dose. Do not take double or extra doses. What may interact with this medicine? -aspirin and aspirin-like medicines -certain antibiotics -chloral hydrate -cisplatin -cyclosporine -digoxin -diuretics -laxatives -lithium -medicines for blood pressure -medicines that relax muscles for surgery -methotrexate -NSAIDs, medicines for pain and inflammation like ibuprofen, naproxen, or indomethacin -phenytoin -steroid medicines like prednisone or cortisone -sucralfate -thyroid hormones This list may not describe all possible interactions. Give your health care provider a list of all the medicines, herbs, non-prescription drugs, or dietary supplements you use. Also tell them if you smoke, drink alcohol, or use illegal drugs. Some items may interact with your medicine. What should I watch for while using this medicine? Visit your doctor or health care professional for regular checks on your progress. Check your blood pressure regularly. Ask your doctor or health care professional what your blood pressure should be, and when you should contact him or her. If you are a diabetic, check your blood sugar as directed. You may need to be on a special diet while taking this medicine. Check with your doctor. Also, ask how many  glasses of fluid you need to drink a day. You must not get dehydrated. You may get drowsy or dizzy. Do not drive, use machinery, or do anything that needs mental alertness until you know how this drug affects you. Do not stand or sit up quickly, especially if you are an older patient. This reduces the risk of dizzy or fainting spells. Alcohol can make you more drowsy and dizzy. Avoid alcoholic drinks. This medicine can make you more sensitive to the sun. Keep out of the sun. If you cannot avoid being in the sun, wear protective clothing and use sunscreen. Do not use sun lamps or tanning beds/booths. What side effects may I notice from receiving this medicine? Side effects that you should report to your doctor or health care professional as soon as possible: -blood in urine or stools -dry mouth -fever or chills -hearing loss or ringing in the ears -irregular heartbeat -muscle pain or weakness, cramps -skin rash -stomach upset, pain, or nausea -tingling or numbness in the hands or feet -unusually weak or tired -vomiting or diarrhea -yellowing of the eyes or skin Side effects that usually do not require medical attention (report to your doctor or health care professional if they continue or are bothersome): -headache -  loss of appetite -unusual bleeding or bruising This list may not describe all possible side effects. Call your doctor for medical advice about side effects. You may report side effects to FDA at 1-800-FDA-1088. Where should I keep my medicine? Keep out of the reach of children. Store at room temperature between 15 and 30 degrees C (59 and 86 degrees F). Protect from light. Throw away any unused medicine after the expiration date. NOTE: This sheet is a summary. It may not cover all possible information. If you have questions about this medicine, talk to your doctor, pharmacist, or health care provider.  2019 Elsevier/Gold Standard (2014-04-12 13:49:50)

## 2018-07-22 ENCOUNTER — Other Ambulatory Visit: Payer: Self-pay

## 2018-07-22 ENCOUNTER — Encounter: Payer: Self-pay | Admitting: Cardiology

## 2018-07-22 ENCOUNTER — Telehealth (INDEPENDENT_AMBULATORY_CARE_PROVIDER_SITE_OTHER): Payer: BC Managed Care – PPO | Admitting: Cardiology

## 2018-07-22 VITALS — BP 128/90 | Wt 289.0 lb

## 2018-07-22 DIAGNOSIS — Z8616 Personal history of COVID-19: Secondary | ICD-10-CM

## 2018-07-22 DIAGNOSIS — I420 Dilated cardiomyopathy: Secondary | ICD-10-CM | POA: Diagnosis not present

## 2018-07-22 DIAGNOSIS — I447 Left bundle-branch block, unspecified: Secondary | ICD-10-CM

## 2018-07-22 DIAGNOSIS — Z8619 Personal history of other infectious and parasitic diseases: Secondary | ICD-10-CM | POA: Diagnosis not present

## 2018-07-22 DIAGNOSIS — I10 Essential (primary) hypertension: Secondary | ICD-10-CM | POA: Diagnosis not present

## 2018-07-22 NOTE — Patient Instructions (Signed)
Medication Instructions:  Your physician recommends that you continue on your current medications as directed. Please refer to the Current Medication list given to you today.  If you need a refill on your cardiac medications before your next appointment, please call your pharmacy.   Lab work: Your physician recommends that you return for lab work tomorrow : bmp   If you have labs (blood work) drawn today and your tests are completely normal, you will receive your results only by: Marland Kitchen MyChart Message (if you have MyChart) OR . A paper copy in the mail If you have any lab test that is abnormal or we need to change your treatment, we will call you to review the results.  Testing/Procedures: None.   Follow-Up: At [facility], you and your health needs are our priority.  As part of our continuing mission to provide you with exceptional heart care, we have created designated Provider Care Teams.  These Care Teams include your primary Cardiologist (physician) and Advanced Practice Providers (APPs -  Physician Assistants and Nurse Practitioners) who all work together to provide you with the care you need, when you need it. You will need a follow up appointment in 2 weeks.  Please call our office 2 months in advance to schedule this appointment.  You may see No primary care provider on file. or another member of our [employer] Provider Team in [place]: Shirlee More, MD . Jyl Heinz, MD  Any Other Special Instructions Will Be Listed Below (If Applicable).

## 2018-07-22 NOTE — Progress Notes (Signed)
Virtual Visit via Video Note   This visit type was conducted due to national recommendations for restrictions regarding the COVID-19 Pandemic (e.g. social distancing) in an effort to limit this patient's exposure and mitigate transmission in our community.  Due to his co-morbid illnesses, this patient is at least at moderate risk for complications without adequate follow up.  This format is felt to be most appropriate for this patient at this time.  All issues noted in this document were discussed and addressed.  A limited physical exam was performed with this format.  Please refer to the patient's chart for his consent to telehealth for [facility].  Evaluation Performed:  Follow-up visit  This visit type was conducted due to national recommendations for restrictions regarding the COVID-19 Pandemic (e.g. social distancing).  This format is felt to be most appropriate for this patient at this time.  All issues noted in this document were discussed and addressed.  No physical exam was performed (except for noted visual exam findings with Video Visits).  Please refer to the patient's chart (MyChart message for video visits and phone note for telephone visits) for the patient's consent to telehealth for [facility].  Date:  07/22/2018  ID: Raymond Alvarez, DOB November 21, 1955, MRN [id number]   Patient Location: [address] [place] [address]   Provider location:   [place] Office  PCP:  Raina Mina., MD  Cardiologist:  Jenne Campus, MD     Chief Complaint: I am doing well  History of Present Illness:    Raymond Alvarez is a 63 y.o. male  who presents via audio/video conferencing for a telehealth visit today.  With history of cardiomyopathy ejection fraction 30 to 35%, history of COVID-19 infection, left bundle branch block, mild asthma, chronic kidney disease.  We do have a video visit today to check on his condition I am gradually trying to put him on the right  medication for his cardiomyopathy recently we initiated Entresto he is tolerating this very well I will schedule him tomorrow to have a Chem-7 as well as EKG done based on that we will decide if we can continue with Entresto Lasix and Aldactone as well as will decide about potentially starting beta-blockade.  Choice will be carvedilol 3.125 twice daily.  Overall he is doing very well denies having any chest pain tightness squeezing pressure burning in the chest.  No shortness of breath no swelling of lower extremities no dizziness no passing out.  He is getting ready to go to the beach next week  The patient does not have symptoms concerning for COVID-19 infection (fever, chills, cough, or new SHORTNESS OF BREATH).    Prior CV studies:   The following studies were reviewed today:       Past Medical History:  Diagnosis Date  . Anemia   . Anxiety   . Asthma   . Chronic kidney disease   . Colon polyp   . Depression   . Diverticulitis   . GERD (gastroesophageal reflux disease)   . Gout   . Hypertension   . Prostate cancer ([place])   . Seasonal allergies   . Wears glasses     Past Surgical History:  Procedure Laterality Date  . APPENDECTOMY  1970  . COLONOSCOPY  09/24/2016   Colonic polyp status post polypectomy. Tubular adenoma.   . ESOPHAGOGASTRODUODENOSCOPY  12/10/2016   Small hiatal hernia. Mild gastritis. Gastric polyps status post polypectomy x 4   . 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 PROSTATECTOMY  2015  . TONSILLECTOMY       Current Meds  Medication Sig  . albuterol (PROVENTIL HFA;VENTOLIN HFA) 108 (90 Base) MCG/ACT inhaler Inhale 2 puffs into the lungs every 4 (four) hours as needed for wheezing or shortness of breath.  . allopurinol (ZYLOPRIM) 300 MG tablet Take  300 mg by mouth daily.  . cyanocobalamin (,VITAMIN B-12,) 1000 MCG/ML injection Inject 1,000 mcg into the muscle every 30 (thirty) days.  Marland Kitchen dicyclomine (BENTYL) 10 MG capsule Take 1 capsule (10 mg total) by mouth 4 (four) times daily -  before meals and at bedtime.  . ferrous sulfate 325 (65 FE) MG EC tablet Take 325 mg by mouth 3 (three) times a week.  . furosemide (LASIX) 20 MG tablet Take 1 tablet (20 mg total) by mouth daily.  Marland Kitchen guaiFENesin (MUCINEX) 600 MG 12 hr tablet Take 2 tablets (1,200 mg total) by mouth 2 (two) times daily.  . hyoscyamine (LEVSIN SL) 0.125 MG SL tablet Place 1 tablet (0.125 mg total) under the tongue every 4 (four) hours as needed. (Patient taking differently: Place 0.125 mg under the tongue every 4 (four) hours as needed for cramping. )  . LORazepam (ATIVAN) 1 MG tablet Take 1 mg by mouth daily as needed for anxiety.  . methocarbamol (ROBAXIN) 750 MG tablet Take 750 mg by mouth 4 (four) times daily.  . Olopatadine HCl (PAZEO) 0.7 % SOLN Place 1 drop into both eyes daily.  Marland Kitchen omeprazole (PRILOSEC) 20 MG capsule Take 20 mg by mouth daily.  . sacubitril-valsartan (ENTRESTO) 24-26 MG Take 1 tablet by mouth 2 (two) times daily.  Marland Kitchen spironolactone (ALDACTONE) 25 MG tablet Take 1 tablet by mouth daily.  . vitamin C (VITAMIN C) 500 MG tablet Take 1 tablet (500 mg total) by mouth daily.  Marland Kitchen vortioxetine HBr (TRINTELLIX) 20 MG TABS tablet Take 20 mg by mouth daily.   Marland Kitchen zinc sulfate 220 (50 Zn) MG capsule Take 1 capsule (220 mg total) by mouth daily.      Family History: The patient's family history includes Breast cancer in his mother; Prostate cancer in his father and maternal grandfather.   ROS:   Please see the history of present illness.     All other systems reviewed and are negative.   Labs/Other Tests and Data Reviewed:     Recent Labs: 05/12/2018: TSH 0.939 05/17/2018: ALT 72; BUN 14; Creatinine, Ser 1.23; Hemoglobin 14.5; Magnesium 2.2; Platelets 367; Potassium  4.4; Sodium 130  Recent Lipid Panel No results found for: CHOL, TRIG, HDL, CHOLHDL, VLDL, LDLCALC, LDLDIRECT    Exam:    Vital Signs:  BP 128/90   Wt 289 lb (131.1 kg)   BMI 37.11 kg/m     Wt Readings from Last 3 Encounters:  07/22/18 289 lb (131.1 kg)  07/15/18 289 lb (131.1 kg)  06/25/18 280 lb (127 kg)     Well nourished, well developed in no acute distress. Alert awake oriented x3 quite happy to be able to talk to me doing well.  Diagnosis for this visit:   1. Dilated cardiomyopathy ([place])   2. LBBB (left bundle branch block)   3. Benign essential hypertension   4. History of 2019 novel coronavirus disease (COVID-19)      ASSESSMENT & PLAN:  1.  Dilated cardiomyopathy plan as outlined above. 2.  Left bundle branch block.  Noted 3.  Benign essential hypertension blood pressure well controlled continue present management 4.  History of coronavirus infection recovered.  He spent a week in the ICU  COVID-19 Education: The signs and symptoms of COVID-19 were discussed with the patient and how to seek care for testing (follow up with PCP or arrange E-visit).  The importance of social distancing was discussed today.  Patient Risk:   After full review of this patients clinical status, I feel that they are at least moderate risk at this time.  Time:   Today, I have spent 15 minutes with the patient with telehealth technology discussing pt health issues.  I spent 5 minutes reviewing her chart before the visit.  Visit was finished at 10:07 AM.    Medication Adjustments/Labs and Tests Ordered: Current medicines are reviewed at length with the patient today.  Concerns regarding medicines are outlined above.  No orders of the defined types were placed in this encounter.  Medication changes: No orders of the defined types were placed in this encounter.    Disposition: Follow-up in 2 weeks  Signed, Park Liter, MD, [facility] 07/22/2018 10:08 AM    [place]

## 2018-07-22 NOTE — Addendum Note (Signed)
Addended by: Ashok Norris on: 07/22/2018 10:15 AM   Modules accepted: Orders

## 2018-07-23 ENCOUNTER — Ambulatory Visit (INDEPENDENT_AMBULATORY_CARE_PROVIDER_SITE_OTHER): Payer: BC Managed Care – PPO | Admitting: Cardiology

## 2018-07-23 VITALS — BP 124/80 | HR 90 | Wt 284.0 lb

## 2018-07-23 DIAGNOSIS — I420 Dilated cardiomyopathy: Secondary | ICD-10-CM

## 2018-07-23 DIAGNOSIS — I10 Essential (primary) hypertension: Secondary | ICD-10-CM

## 2018-07-23 DIAGNOSIS — R0609 Other forms of dyspnea: Secondary | ICD-10-CM | POA: Diagnosis not present

## 2018-07-23 DIAGNOSIS — I447 Left bundle-branch block, unspecified: Secondary | ICD-10-CM | POA: Diagnosis not present

## 2018-07-23 MED ORDER — CARVEDILOL 3.125 MG PO TABS: 3.1250 mg | ORAL_TABLET | Freq: Two times a day (BID) | ORAL | 1 refills | Status: DC

## 2018-07-23 NOTE — Addendum Note (Signed)
Addended by: Ashok Norris on: 07/23/2018 02:03 PM   Modules accepted: Orders

## 2018-07-23 NOTE — Addendum Note (Signed)
Addended by: Jerl Santos R on: 07/23/2018 02:14 PM   Modules accepted: Orders

## 2018-07-23 NOTE — Progress Notes (Signed)
[facility]  Cardiology Office Note:    Date:  07/23/2018   ID:  Raymond Alvarez, DOB 1955/06/02, MRN [id number]  PCP:  Raina Mina., MD  Cardiologist:  Jenne Campus, MD    Referring MD: Raina Mina., MD   Chief Complaint  Patient presents with  . Here for EKG  Upcoming chest for EKG and blood work  History of Present Illness:    Raymond Alvarez is a 63 y.o. male doing well comes for EKG and blood work as have any chest pain tightness squeezing pressure burning chest no dizziness  Past Medical History:  Diagnosis Date  . Anemia   . Anxiety   . Asthma   . Chronic kidney disease   . Colon polyp   . Depression   . Diverticulitis   . GERD (gastroesophageal reflux disease)   . Gout   . Hypertension   . Prostate cancer ([place])   . Seasonal allergies   . Wears glasses     Past Surgical History:  Procedure Laterality Date  . APPENDECTOMY  1970  . COLONOSCOPY  09/24/2016   Colonic polyp status post polypectomy. Tubular adenoma.   . ESOPHAGOGASTRODUODENOSCOPY  12/10/2016   Small hiatal hernia. Mild gastritis. Gastric polyps status post polypectomy x 4   . MASS EXCISION Left 12/15/2013   Procedure: EXCISION MUCOID TUMOR LEFT INDEX DEBRIDEMENT DISTAL INTERPHALANGEAL JOINT LEFT INDEX FINGER;  Surgeon: Daryll Brod, MD;  Location: [facility];  Service: Orthopedics;  Laterality: Left;  . PROSTATECTOMY  2015  . TONSILLECTOMY      Current Medications: Current Meds  Medication Sig  . albuterol (PROVENTIL HFA;VENTOLIN HFA) 108 (90 Base) MCG/ACT inhaler Inhale 2 puffs into the lungs every 4 (four) hours as needed for wheezing or shortness of breath.  . allopurinol (ZYLOPRIM) 300 MG tablet Take 300 mg by mouth daily.  . cyanocobalamin (,VITAMIN B-12,) 1000 MCG/ML injection Inject 1,000 mcg into the muscle every 30 (thirty) days.  Marland Kitchen dicyclomine (BENTYL) 10 MG capsule Take 1 capsule (10 mg total) by mouth 4 (four) times daily -  before meals and at bedtime.  . ferrous  sulfate 325 (65 FE) MG EC tablet Take 325 mg by mouth 3 (three) times a week.  . furosemide (LASIX) 20 MG tablet Take 1 tablet (20 mg total) by mouth daily.  Marland Kitchen guaiFENesin (MUCINEX) 600 MG 12 hr tablet Take 2 tablets (1,200 mg total) by mouth 2 (two) times daily.  . hyoscyamine (LEVSIN SL) 0.125 MG SL tablet Place 1 tablet (0.125 mg total) under the tongue every 4 (four) hours as needed. (Patient taking differently: Place 0.125 mg under the tongue every 4 (four) hours as needed for cramping. )  . LORazepam (ATIVAN) 1 MG tablet Take 1 mg by mouth daily as needed for anxiety.  . methocarbamol (ROBAXIN) 750 MG tablet Take 750 mg by mouth 4 (four) times daily.  . Olopatadine HCl (PAZEO) 0.7 % SOLN Place 1 drop into both eyes daily.  Marland Kitchen omeprazole (PRILOSEC) 20 MG capsule Take 20 mg by mouth daily.  . sacubitril-valsartan (ENTRESTO) 24-26 MG Take 1 tablet by mouth 2 (two) times daily.  Marland Kitchen spironolactone (ALDACTONE) 25 MG tablet Take 1 tablet by mouth daily.  . vitamin C (VITAMIN C) 500 MG tablet Take 1 tablet (500 mg total) by mouth daily.  Marland Kitchen vortioxetine HBr (TRINTELLIX) 20 MG TABS tablet Take 20 mg by mouth daily.   Marland Kitchen zinc sulfate 220 (50 Zn) MG capsule Take 1 capsule (220 mg total)  by mouth daily.     Allergies:   Other and Sulfa antibiotics   Social History   Socioeconomic History  . Marital status: Married    Spouse name: Not on file  . Number of children: 2  . Years of education: Not on file  . Highest education level: Not on file  Occupational History  . Occupation: Sport and exercise psychologist  Social Needs  . Financial resource strain: Not on file  . Food insecurity    Worry: Not on file    Inability: Not on file  . Transportation needs    Medical: Not on file    Non-medical: Not on file  Tobacco Use  . Smoking status: Never Smoker  . Smokeless tobacco: Never Used  Substance and Sexual Activity  . Alcohol use: Yes    Comment: occ  . Drug use: No  . Sexual activity: Not on file   Lifestyle  . Physical activity    Days per week: Not on file    Minutes per session: Not on file  . Stress: Not on file  Relationships  . Social Herbalist on phone: Not on file    Gets together: Not on file    Attends religious service: Not on file    Active member of club or organization: Not on file    Attends meetings of clubs or organizations: Not on file    Relationship status: Not on file  Other Topics Concern  . Not on file  Social History Narrative  . Not on file     Family History: The patient's family history includes Breast cancer in his mother; Prostate cancer in his father and maternal grandfather. ROS:   Please see the history of present illness.    All 14 point review of systems negative except as described per history of present illness  EKGs/Labs/Other Studies Reviewed:      Recent Labs: 05/12/2018: TSH 0.939 05/17/2018: ALT 72; BUN 14; Creatinine, Ser 1.23; Hemoglobin 14.5; Magnesium 2.2; Platelets 367; Potassium 4.4; Sodium 130  Recent Lipid Panel No results found for: CHOL, TRIG, HDL, CHOLHDL, VLDL, LDLCALC, LDLDIRECT  Physical Exam:    VS:  Wt 284 lb (128.8 kg)   BMI 36.46 kg/m     Wt Readings from Last 3 Encounters:  07/23/18 284 lb (128.8 kg)  07/22/18 289 lb (131.1 kg)  07/15/18 289 lb (131.1 kg)     GEN:  Well nourished, well developed in no acute distress HEENT: Normal NECK: No JVD; No carotid bruits LYMPHATICS: No lymphadenopathy CARDIAC: RRR, no murmurs, no rubs, no gallops RESPIRATORY:  Clear to auscultation without rales, wheezing or rhonchi  ABDOMEN: Soft, non-tender, non-distended MUSCULOSKELETAL:  No edema; No deformity  SKIN: Warm and dry LOWER EXTREMITIES: no swelling NEUROLOGIC:  Alert and oriented x 3 PSYCHIATRIC:  Normal affect   ASSESSMENT:    1. Dilated cardiomyopathy ([place])   2. LBBB (left bundle branch block)   3. Benign essential hypertension   4. Dyspnea on exertion    PLAN:    In order of  problems listed above:  1. Cardiomyopathy with significantly diminished ejection fraction trying to put him on appropriate medications and echocardiogram will be repeated and decision will be made about potentially doing ICD with biventricular pacing.  Also etiology of this phenomenon is unclear ischemia need to be investigated now we try just put him on right medications.  He came for EKG today EKG showed normal sinus rhythm rate 83 left bundle branch  block.  PR interval is 192   Medication Adjustments/Labs and Tests Ordered: Current medicines are reviewed at length with the patient today.  Concerns regarding medicines are outlined above.  No orders of the defined types were placed in this encounter.  Medication changes: No orders of the defined types were placed in this encounter.   Signed, Park Liter, MD, [facility] 07/23/2018 1:01 PM    [place]

## 2018-07-23 NOTE — Patient Instructions (Signed)
Medication Instructions:  Your physician has recommended you make the following change in your medication:  Start: Carvedilol 3.125 mg twice daily   If you need a refill on your cardiac medications before your next appointment, please call your pharmacy.   Lab work: None.  If you have labs (blood work) drawn today and your tests are completely normal, you will receive your results only by: Marland Kitchen MyChart Message (if you have MyChart) OR . A paper copy in the mail If you have any lab test that is abnormal or we need to change your treatment, we will call you to review the results.  Testing/Procedures: None.   Follow-Up: At [facility], you and your health needs are our priority.  As part of our continuing mission to provide you with exceptional heart care, we have created designated Provider Care Teams.  These Care Teams include your primary Cardiologist (physician) and Advanced Practice Providers (APPs -  Physician Assistants and Nurse Practitioners) who all work together to provide you with the care you need, when you need it. You will need a follow up appointment in 2 weeks.  Please call our office 2 months in advance to schedule this appointment.  You may see No primary care provider on file. or another member of our [employer] Provider Team in [place]: Shirlee More, MD . Jyl Heinz, MD  Any Other Special Instructions Will Be Listed Below (If Applicable).

## 2018-07-24 LAB — BASIC METABOLIC PANEL
BUN/Creatinine Ratio: 12 (ref 10–24)
BUN: 15 mg/dL (ref 8–27)
CO2: 23 mmol/L (ref 20–29)
Calcium: 9.5 mg/dL (ref 8.6–10.2)
Chloride: 101 mmol/L (ref 96–106)
Creatinine, Ser: 1.21 mg/dL (ref 0.76–1.27)
GFR calc Af Amer: 75 mL/min/{1.73_m2} (ref >59)
GFR calc non Af Amer: 65 mL/min/{1.73_m2} (ref >59)
Glucose: 94 mg/dL (ref 65–99)
Potassium: 4.6 mmol/L (ref 3.5–5.2)
Sodium: 139 mmol/L (ref 134–144)

## 2018-08-02 ENCOUNTER — Telehealth: Payer: BC Managed Care – PPO | Admitting: Cardiology

## 2018-08-05 ENCOUNTER — Encounter: Payer: Self-pay | Admitting: Cardiology

## 2018-08-05 ENCOUNTER — Ambulatory Visit (INDEPENDENT_AMBULATORY_CARE_PROVIDER_SITE_OTHER): Payer: BC Managed Care – PPO | Admitting: Cardiology

## 2018-08-05 ENCOUNTER — Other Ambulatory Visit: Payer: Self-pay

## 2018-08-05 ENCOUNTER — Ambulatory Visit: Payer: BC Managed Care – PPO | Admitting: Cardiology

## 2018-08-05 VITALS — BP 110/78 | HR 101 | Wt 287.0 lb

## 2018-08-05 DIAGNOSIS — Z8616 Personal history of COVID-19: Secondary | ICD-10-CM

## 2018-08-05 DIAGNOSIS — I447 Left bundle-branch block, unspecified: Secondary | ICD-10-CM | POA: Diagnosis not present

## 2018-08-05 DIAGNOSIS — I420 Dilated cardiomyopathy: Secondary | ICD-10-CM

## 2018-08-05 DIAGNOSIS — Z8619 Personal history of other infectious and parasitic diseases: Secondary | ICD-10-CM | POA: Diagnosis not present

## 2018-08-05 DIAGNOSIS — I10 Essential (primary) hypertension: Secondary | ICD-10-CM

## 2018-08-05 MED ORDER — CARVEDILOL 6.25 MG PO TABS: 6.2500 mg | ORAL_TABLET | Freq: Two times a day (BID) | ORAL | 1 refills | Status: DC

## 2018-08-05 NOTE — Patient Instructions (Signed)
Medication Instructions:  Your physician has recommended you make the following change in your medication:   Increase: Coreg 6.25 mg twice daily   If you need a refill on your cardiac medications before your next appointment, please call your pharmacy.   Lab work: None.  If you have labs (blood work) drawn today and your tests are completely normal, you will receive your results only by: Marland Kitchen MyChart Message (if you have MyChart) OR . A paper copy in the mail If you have any lab test that is abnormal or we need to change your treatment, we will call you to review the results.  Testing/Procedures: None.   Follow-Up: At [facility], you and your health needs are our priority.  As part of our continuing mission to provide you with exceptional heart care, we have created designated Provider Care Teams.  These Care Teams include your primary Cardiologist (physician) and Advanced Practice Providers (APPs -  Physician Assistants and Nurse Practitioners) who all work together to provide you with the care you need, when you need it. You will need a follow up appointment in 2 weeks.  Please call our office 2 months in advance to schedule this appointment.  You may see No primary care provider on file. or another member of our [employer] Provider Team in [place]: Shirlee More, MD . Jyl Heinz, MD  Any Other Special Instructions Will Be Listed Below (If Applicable).  Dr. Agustin Cree has referred you see Dr. Curt Bears for a ICD. Their office should call you within one week if not please call our office.

## 2018-08-05 NOTE — Progress Notes (Signed)
Cardiology Office Note:    Date:  08/05/2018   ID:  Raymond Alvarez, DOB May 31, 1955, MRN [id number]  PCP:  Raina Mina., MD  Cardiologist:  Jenne Campus, MD    Referring MD: Raina Mina., MD   Chief Complaint  Patient presents with  . Follow-up  And doing well  History of Present Illness:    Raymond Alvarez is a 63 y.o. male with history of cardiomyopathy, also cough at 21 infection after that he still having difficulty with his heart and his ejection fraction severely diminished.  Overall clinically is doing much better he is on Aldactone Entresto will recently repeated echocardiogram on him sadly his ejection fraction still diminished.  I brought him today to the office to talk about ICD therapy.  I explained to him what I said these was the purpose of this I also explained to him that he would be a good candidate for CRT therapy because of left bundle branch block with a QRS duration more than 150 ms.  He is New [place] Heart Association class II.  Past Medical History:  Diagnosis Date  . Anemia   . Anxiety   . Asthma   . Chronic kidney disease   . Colon polyp   . Depression   . Diverticulitis   . GERD (gastroesophageal reflux disease)   . Gout   . Hypertension   . Prostate cancer ([place])   . Seasonal allergies   . Wears glasses     Past Surgical History:  Procedure Laterality Date  . APPENDECTOMY  1970  . COLONOSCOPY  09/24/2016   Colonic polyp status post polypectomy. Tubular adenoma.   . ESOPHAGOGASTRODUODENOSCOPY  12/10/2016   Small hiatal hernia. Mild gastritis. Gastric polyps status post polypectomy x 4   . MASS EXCISION Left 12/15/2013   Procedure: EXCISION MUCOID TUMOR LEFT INDEX DEBRIDEMENT DISTAL INTERPHALANGEAL JOINT LEFT INDEX FINGER;  Surgeon: Daryll Brod, MD;  Location: [facility];  Service: Orthopedics;  Laterality: Left;  . PROSTATECTOMY  2015  . TONSILLECTOMY      Current Medications: Current Meds  Medication Sig  . albuterol  (PROVENTIL HFA;VENTOLIN HFA) 108 (90 Base) MCG/ACT inhaler Inhale 2 puffs into the lungs every 4 (four) hours as needed for wheezing or shortness of breath.  . allopurinol (ZYLOPRIM) 300 MG tablet Take 300 mg by mouth daily.  . carvedilol (COREG) 3.125 MG tablet Take 1 tablet (3.125 mg total) by mouth 2 (two) times daily.  . cyanocobalamin (,VITAMIN B-12,) 1000 MCG/ML injection Inject 1,000 mcg into the muscle every 30 (thirty) days.  Marland Kitchen dicyclomine (BENTYL) 10 MG capsule Take 1 capsule (10 mg total) by mouth 4 (four) times daily -  before meals and at bedtime.  . ferrous sulfate 325 (65 FE) MG EC tablet Take 325 mg by mouth 3 (three) times a week.  . furosemide (LASIX) 20 MG tablet Take 1 tablet (20 mg total) by mouth daily.  Marland Kitchen guaiFENesin (MUCINEX) 600 MG 12 hr tablet Take 2 tablets (1,200 mg total) by mouth 2 (two) times daily.  . hyoscyamine (LEVSIN SL) 0.125 MG SL tablet Place 1 tablet (0.125 mg total) under the tongue every 4 (four) hours as needed. (Patient taking differently: Place 0.125 mg under the tongue every 4 (four) hours as needed for cramping. )  . LORazepam (ATIVAN) 1 MG tablet Take 1 mg by mouth daily as needed for anxiety.  . methocarbamol (ROBAXIN) 750 MG tablet Take 750 mg by mouth 4 (four) times daily.  Marland Kitchen  Olopatadine HCl (PAZEO) 0.7 % SOLN Place 1 drop into both eyes daily.  Marland Kitchen omeprazole (PRILOSEC) 20 MG capsule Take 20 mg by mouth daily.  . sacubitril-valsartan (ENTRESTO) 24-26 MG Take 1 tablet by mouth 2 (two) times daily.  Marland Kitchen spironolactone (ALDACTONE) 25 MG tablet Take 1 tablet by mouth daily.  . vitamin C (VITAMIN C) 500 MG tablet Take 1 tablet (500 mg total) by mouth daily.  Marland Kitchen vortioxetine HBr (TRINTELLIX) 20 MG TABS tablet Take 20 mg by mouth daily.   Marland Kitchen zinc sulfate 220 (50 Zn) MG capsule Take 1 capsule (220 mg total) by mouth daily.     Allergies:   Other and Sulfa antibiotics   Social History   Socioeconomic History  . Marital status: Married    Spouse name: Not  on file  . Number of children: 2  . Years of education: Not on file  . Highest education level: Not on file  Occupational History  . Occupation: Sport and exercise psychologist  Social Needs  . Financial resource strain: Not on file  . Food insecurity    Worry: Not on file    Inability: Not on file  . Transportation needs    Medical: Not on file    Non-medical: Not on file  Tobacco Use  . Smoking status: Never Smoker  . Smokeless tobacco: Never Used  Substance and Sexual Activity  . Alcohol use: Yes    Comment: occ  . Drug use: No  . Sexual activity: Not on file  Lifestyle  . Physical activity    Days per week: Not on file    Minutes per session: Not on file  . Stress: Not on file  Relationships  . Social Herbalist on phone: Not on file    Gets together: Not on file    Attends religious service: Not on file    Active member of club or organization: Not on file    Attends meetings of clubs or organizations: Not on file    Relationship status: Not on file  Other Topics Concern  . Not on file  Social History Narrative  . Not on file     Family History: The patient's family history includes Breast cancer in his mother; Prostate cancer in his father and maternal grandfather. ROS:   Please see the history of present illness.    All 14 point review of systems negative except as described per history of present illness  EKGs/Labs/Other Studies Reviewed:      Recent Labs: 05/12/2018: TSH 0.939 05/17/2018: ALT 72; Hemoglobin 14.5; Magnesium 2.2; Platelets 367 07/23/2018: BUN 15; Creatinine, Ser 1.21; Potassium 4.6; Sodium 139  Recent Lipid Panel No results found for: CHOL, TRIG, HDL, CHOLHDL, VLDL, LDLCALC, LDLDIRECT  Physical Exam:    VS:  BP 110/78   Pulse (!) 101   Wt 287 lb (130.2 kg)   SpO2 98%   BMI 36.85 kg/m     Wt Readings from Last 3 Encounters:  08/05/18 287 lb (130.2 kg)  07/23/18 284 lb (128.8 kg)  07/22/18 289 lb (131.1 kg)     GEN:  Well  nourished, well developed in no acute distress HEENT: Normal NECK: No JVD; No carotid bruits LYMPHATICS: No lymphadenopathy CARDIAC: RRR, no murmurs, no rubs, no gallops RESPIRATORY:  Clear to auscultation without rales, wheezing or rhonchi  ABDOMEN: Soft, non-tender, non-distended MUSCULOSKELETAL:  No edema; No deformity  SKIN: Warm and dry LOWER EXTREMITIES: no swelling NEUROLOGIC:  Alert and oriented x 3  PSYCHIATRIC:  Normal affect   ASSESSMENT:    1. Dilated cardiomyopathy ([place])   2. LBBB (left bundle branch block)   3. Benign essential hypertension   4. History of 2019 novel coronavirus disease (COVID-19)    PLAN:    In order of problems listed above:  1. Dilated cardiomyopathy today I will double the dose of carvedilol he be on 6.25 twice daily.  We will continue Entresto from the present dose as well as Aldactone.  I will talk to him in about 10 days and based on that we will decide if he can double the dose of Entresto.  I initiated conversation about CRT.  He is interested will refer him to EP 2. Benign essential hypertension blood pressure well controlled 3. Etiology of his dilated cardiomyopathy is unclear could be related to COVID-19 infection.  We may be forced to do ischemia work-up.   Medication Adjustments/Labs and Tests Ordered: Current medicines are reviewed at length with the patient today.  Concerns regarding medicines are outlined above.  No orders of the defined types were placed in this encounter.  Medication changes: No orders of the defined types were placed in this encounter.   Signed, Park Liter, MD, [facility] 08/05/2018 [place]

## 2018-08-10 ENCOUNTER — Telehealth: Payer: Self-pay | Admitting: Cardiology

## 2018-08-10 NOTE — Telephone Encounter (Signed)
New message    Pt scheduled for virtual visit with Dr. Curt Bears per referral from Dr. Agustin Cree. Pt has a smart phone and will do video visit, phone number is listed in appt notes.       Virtual Visit Pre-Appointment Phone Call  "(Name), I am calling you today to discuss your upcoming appointment. We are currently trying to limit exposure to the virus that causes COVID-19 by seeing patients at home rather than in the office."  1. "What is the BEST phone number to call the day of the visit?" - include this in appointment notes  2. Do you have or have access to (through a family member/friend) a smartphone with video capability that we can use for your visit?" a. If yes - list this number in appt notes as cell (if different from BEST phone #) and list the appointment type as a VIDEO visit in appointment notes b. If no - list the appointment type as a PHONE visit in appointment notes  3. Confirm consent - "In the setting of the current Covid19 crisis, you are scheduled for a (phone or video) visit with your provider on (date) at (time).  Just as we do with many in-office visits, in order for you to participate in this visit, we must obtain consent.  If you'd like, I can send this to your mychart (if signed up) or email for you to review.  Otherwise, I can obtain your verbal consent now.  All virtual visits are billed to your insurance company just like a normal visit would be.  By agreeing to a virtual visit, we'd like you to understand that the technology does not allow for your provider to perform an examination, and thus may limit your provider's ability to fully assess your condition. If your provider identifies any concerns that need to be evaluated in person, we will make arrangements to do so.  Finally, though the technology is pretty good, we cannot assure that it will always work on either your or our end, and in the setting of a video visit, we may have to convert it to a phone-only  visit.  In either situation, we cannot ensure that we have a secure connection.  Are you willing to proceed?" STAFF: Did the patient verbally acknowledge consent to telehealth visit? Document YES/NO here: YES  4. Advise patient to be prepared - "Two hours prior to your appointment, go ahead and check your blood pressure, pulse, oxygen saturation, and your weight (if you have the equipment to check those) and write them all down. When your visit starts, your provider will ask you for this information. If you have an Apple Watch or Kardia device, please plan to have heart rate information ready on the day of your appointment. Please have a pen and paper handy nearby the day of the visit as well."  5. Give patient instructions for MyChart download to smartphone OR Doximity/Doxy.me as below if video visit (depending on what platform provider is using)  6. Inform patient they will receive a phone call 15 minutes prior to their appointment time (may be from unknown caller ID) so they should be prepared to answer    TELEPHONE CALL NOTE  Raymond Alvarez has been deemed a candidate for a follow-up tele-health visit to limit community exposure during the Covid-19 pandemic. I spoke with the patient via phone to ensure availability of phone/video source, confirm preferred email & phone number, and discuss instructions and expectations.  I reminded Raymond Alvarez  to be prepared with any vital sign and/or heart rhythm information that could potentially be obtained via home monitoring, at the time of his visit. I reminded Raymond Alvarez to expect a phone call prior to his visit.  Raymond Alvarez 08/10/2018 3:35 PM   INSTRUCTIONS FOR DOWNLOADING THE MYCHART APP TO SMARTPHONE  - The patient must first make sure to have activated MyChart and know their login information - If Apple, go to [employer] and type in MyChart in the search bar and download the app. If Android, ask patient to go to [employer] and type  in [place] in the search bar and download the app. The app is free but as with any other app downloads, their phone may require them to verify saved payment information or Apple/Android password.  - The patient will need to then log into the app with their MyChart username and password, and select [facility] as their healthcare provider to link the account. When it is time for your visit, go to the MyChart app, find appointments, and click Begin Video Visit. Be sure to Select Allow for your device to access the Microphone and Camera for your visit. You will then be connected, and your provider will be with you shortly.  **If they have any issues connecting, or need assistance please contact MyChart service desk [phone])**  **If using a computer, in order to ensure the best quality for their visit they will need to use either of the following Internet Browsers: [employer], or Google Chrome**  IF USING DOXIMITY or DOXY.ME - The patient will receive a link just prior to their visit by text.     FULL LENGTH CONSENT FOR TELE-HEALTH VISIT   I hereby voluntarily request, consent and authorize [facility] and its employed or contracted physicians, physician assistants, nurse practitioners or other licensed health care professionals (the Practitioner), to provide me with telemedicine health care services (the Services") as deemed necessary by the treating Practitioner. I acknowledge and consent to receive the Services by the Practitioner via telemedicine. I understand that the telemedicine visit will involve communicating with the Practitioner through live audiovisual communication technology and the disclosure of certain medical information by electronic transmission. I acknowledge that I have been given the opportunity to request an in-person assessment or other available alternative prior to the telemedicine visit and am voluntarily participating in the telemedicine visit.  I  understand that I have the right to withhold or withdraw my consent to the use of telemedicine in the course of my care at any time, without affecting my right to future care or treatment, and that the Practitioner or I may terminate the telemedicine visit at any time. I understand that I have the right to inspect all information obtained and/or recorded in the course of the telemedicine visit and may receive copies of available information for a reasonable fee.  I understand that some of the potential risks of receiving the Services via telemedicine include:   Delay or interruption in medical evaluation due to technological equipment failure or disruption;  Information transmitted may not be sufficient (e.g. poor resolution of images) to allow for appropriate medical decision making by the Practitioner; and/or   In rare instances, security protocols could fail, causing a breach of personal health information.  Furthermore, I acknowledge that it is my responsibility to provide information about my medical history, conditions and care that is complete and accurate to the best of my ability. I acknowledge that Practitioner's  advice, recommendations, and/or decision may be based on factors not within their control, such as incomplete or inaccurate data provided by me or distortions of diagnostic images or specimens that may result from electronic transmissions. I understand that the practice of medicine is not an exact science and that Practitioner makes no warranties or guarantees regarding treatment outcomes. I acknowledge that I will receive a copy of this consent concurrently upon execution via email to the email address I last provided but may also request a printed copy by calling the office of [facility].    I understand that my insurance will be billed for this visit.   I have read or had this consent read to me.  I understand the contents of this consent, which adequately explains the  benefits and risks of the Services being provided via telemedicine.   I have been provided ample opportunity to ask questions regarding this consent and the Services and have had my questions answered to my satisfaction.  I give my informed consent for the services to be provided through the use of telemedicine in my medical care  By participating in this telemedicine visit I agree to the above.

## 2018-08-16 ENCOUNTER — Telehealth (INDEPENDENT_AMBULATORY_CARE_PROVIDER_SITE_OTHER): Payer: BC Managed Care – PPO | Admitting: Cardiology

## 2018-08-16 DIAGNOSIS — I420 Dilated cardiomyopathy: Secondary | ICD-10-CM

## 2018-08-16 NOTE — Progress Notes (Signed)
Thank you Will for seeing him so fast, I was afraid that it will take much longer to see you and that is why I made referral now. Sure, will wait for 90 days of Guidelines guided medical therapy, and send him back to you. Interesting if his CM is related to Covid-19? Take Care Will  And thanks for seeing him  Raymond Alvarez

## 2018-08-16 NOTE — Progress Notes (Signed)
Virtual Visit via Video Note   This visit type was conducted due to national recommendations for restrictions regarding the COVID-19 Pandemic (e.g. social distancing) in an effort to limit this patient's exposure and mitigate transmission in our community.  Due to his co-morbid illnesses, this patient is at least at moderate risk for complications without adequate follow up.  This format is felt to be most appropriate for this patient at this time.  All issues noted in this document were discussed and addressed.  A limited physical exam was performed with this format.  Please refer to the patient's chart for his consent to telehealth for [facility].   Date:  08/16/2018   ID:  Raymond Alvarez, DOB 1955-07-04, MRN [id number]  Patient Location: Home Provider Location: Home  PCP:  Raina Mina., MD  Cardiologist: Agustin Cree Electrophysiologist:  None   Evaluation Performed:  Consultation - Raymond Alvarez was referred by Jenne [facility] for the evaluation of CHF.  Chief Complaint: CHF  History of Present Illness:    Raymond Alvarez is a 63 y.o. male with he has a history significant for CHF, CKD, hypertension.  He had coronavirus infection in the past.  He is currently on Aldactone and Entresto, though his ejection fraction remains low after repeat echocardiogram.  Today, denies symptoms of palpitations, chest pain, orthopnea, PND, lower extremity edema, claudication, dizziness, presyncope, syncope, bleeding, or neurologic sequela. The patient is tolerating medications without difficulties.  He continues to have shortness of breath when he exerts himself such as working in the yard or climbing stairs.  Otherwise he is doing well without complaint.  His Entresto and Coreg were recently started, within the last few weeks.  He had not had an echo prior to his most recent echocardiogram.  The patient does not have symptoms concerning for COVID-19 infection (fever, chills, cough, or new shortness  of breath).    Past Medical History:  Diagnosis Date  . Anemia   . Anxiety   . Asthma   . Chronic kidney disease   . Colon polyp   . Depression   . Diverticulitis   . GERD (gastroesophageal reflux disease)   . Gout   . Hypertension   . Prostate cancer ([place])   . Seasonal allergies   . Wears glasses    Past Surgical History:  Procedure Laterality Date  . APPENDECTOMY  1970  . COLONOSCOPY  09/24/2016   Colonic polyp status post polypectomy. Tubular adenoma.   . ESOPHAGOGASTRODUODENOSCOPY  12/10/2016   Small hiatal hernia. Mild gastritis. Gastric polyps status post polypectomy x 4   . 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 PROSTATECTOMY  2015  . TONSILLECTOMY       Current Meds  Medication Sig  . albuterol (PROVENTIL HFA;VENTOLIN HFA) 108 (90 Base) MCG/ACT inhaler Inhale 2 puffs into the lungs every 4 (four) hours as needed for wheezing or shortness of breath.  . allopurinol (ZYLOPRIM) 300 MG tablet Take 300 mg by mouth daily.  . carvedilol (COREG) 6.25 MG tablet Take 1 tablet (6.25 mg total) by mouth 2 (two) times daily.  . cyanocobalamin (,VITAMIN B-12,) 1000 MCG/ML injection Inject 1,000 mcg into the muscle every 30 (thirty) days.  Marland Kitchen dicyclomine (BENTYL) 10 MG capsule Take 1 capsule (10 mg total) by mouth 4 (four) times daily -  before  meals and at bedtime.  . ferrous sulfate 325 (65 FE) MG EC tablet Take 325 mg by mouth 3 (three) times a week.  . furosemide (LASIX) 20 MG tablet Take 1 tablet (20 mg total) by mouth daily.  Marland Kitchen guaiFENesin (MUCINEX) 600 MG 12 hr tablet Take 2 tablets (1,200 mg total) by mouth 2 (two) times daily.  . hyoscyamine (LEVSIN SL) 0.125 MG SL tablet Place 1 tablet (0.125 mg total) under the tongue every 4 (four) hours as needed. (Patient taking  differently: Place 0.125 mg under the tongue every 4 (four) hours as needed for cramping. )  . LORazepam (ATIVAN) 1 MG tablet Take 1 mg by mouth daily as needed for anxiety.  . methocarbamol (ROBAXIN) 750 MG tablet Take 750 mg by mouth 4 (four) times daily.  . Olopatadine HCl (PAZEO) 0.7 % SOLN Place 1 drop into both eyes daily.  Marland Kitchen omeprazole (PRILOSEC) 20 MG capsule Take 20 mg by mouth daily.  . sacubitril-valsartan (ENTRESTO) 24-26 MG Take 1 tablet by mouth 2 (two) times daily.  Marland Kitchen spironolactone (ALDACTONE) 25 MG tablet Take 1 tablet by mouth daily.  . vitamin C (VITAMIN C) 500 MG tablet Take 1 tablet (500 mg total) by mouth daily.  Marland Kitchen vortioxetine HBr (TRINTELLIX) 20 MG TABS tablet Take 20 mg by mouth daily.   Marland Kitchen zinc sulfate 220 (50 Zn) MG capsule Take 1 capsule (220 mg total) by mouth daily.     Allergies:   Other and Sulfa antibiotics   Social History   Tobacco Use  . Smoking status: Never Smoker  . Smokeless tobacco: Never Used  Substance Use Topics  . Alcohol use: Yes    Comment: occ  . Drug use: No     Family Hx: The patient's family history includes Breast cancer in his mother; Prostate cancer in his father and maternal grandfather.  ROS:   Please see the history of present illness.     All other systems reviewed and are negative.   Prior CV studies:   The following studies were reviewed today:  TTE 07/12/18  1. Moderate, fixed thrombus on the apical wall of the left ventricle.  2. The left ventricle has severely reduced systolic function, with an ejection fraction of 25-30%. The cavity size was mild to moderately dilated. There is moderately increased left ventricular wall thickness. Left ventricular diastolic Doppler  parameters are consistent with restrictive filling.  3. The right ventricle has normal systolic function. The cavity was normal. There is no increase in right ventricular wall thickness.  4. Left atrial size was mild-moderately dilated.  5. The mitral  valve is grossly normal.  6. The aortic valve is grossly normal. Aortic valve regurgitation was not assessed by color flow Doppler.  Labs/Other Tests and Data Reviewed:    EKG:  An ECG dated 07/23/2018 was personally reviewed today and demonstrated:  Sinus rhythm, left bundle branch block  Recent Labs: 05/12/2018: TSH 0.939 05/17/2018: ALT 72; Hemoglobin 14.5; Magnesium 2.2; Platelets 367 07/23/2018: BUN 15; Creatinine, Ser 1.21; Potassium 4.6; Sodium 139   Recent Lipid Panel No results found for: CHOL, TRIG, HDL, CHOLHDL, LDLCALC, LDLDIRECT  Wt Readings from Last 3 Encounters:  08/05/18 287 lb (130.2 kg)  07/23/18 284 lb (128.8 kg)  07/22/18 289 lb (131.1 kg)     Objective:    Vital Signs:  BP 120/70    VITAL SIGNS:  reviewed GEN:  no acute distress EYES:  sclerae anicteric, EOMI - Extraocular Movements Intact RESPIRATORY:  normal  respiratory effort, symmetric expansion CARDIOVASCULAR:  no peripheral edema SKIN:  no rash, lesions or ulcers. MUSCULOSKELETAL:  no obvious deformities. NEURO:  alert and oriented x 3, no obvious focal deficit PSYCH:  normal affect  ASSESSMENT & PLAN:    1. Dilated cardiomyopathy: Currently on carvedilol, Entresto, Aldactone.  Ejection fraction remains low.  He does have a left bundle branch block.  He would thus benefit from CRT D.  That being said, he is not had 3 months of optimal medical therapy.  We Manmeet Arzola repeat his echocardiogram and see him back in the office for discussion of therapy at that time. 2. Hypertension: Currently well controlled  COVID-19 Education: The signs and symptoms of COVID-19 were discussed with the patient and how to seek care for testing (follow up with PCP or arrange E-visit).  The importance of social distancing was discussed today.  Time:   Today, I have spent 10 minutes with the patient with telehealth technology discussing the above problems.     Medication Adjustments/Labs and Tests Ordered: Current medicines  are reviewed at length with the patient today.  Concerns regarding medicines are outlined above.   Tests Ordered: Orders Placed This Encounter  Procedures  . ECHOCARDIOGRAM COMPLETE   Case discussed with primary cardiology  Medication Changes: No orders of the defined types were placed in this encounter.   Follow Up:  Virtual Visit or In Person in 3 month(s)  Signed, Ezri Landers Meredith Leeds, MD  08/16/2018 10:19 AM    [place]

## 2018-08-23 ENCOUNTER — Encounter: Payer: Self-pay | Admitting: Cardiology

## 2018-08-23 ENCOUNTER — Telehealth (INDEPENDENT_AMBULATORY_CARE_PROVIDER_SITE_OTHER): Payer: BC Managed Care – PPO | Admitting: Cardiology

## 2018-08-23 ENCOUNTER — Telehealth: Payer: Self-pay | Admitting: Emergency Medicine

## 2018-08-23 ENCOUNTER — Other Ambulatory Visit: Payer: Self-pay

## 2018-08-23 VITALS — Wt 284.0 lb

## 2018-08-23 DIAGNOSIS — I447 Left bundle-branch block, unspecified: Secondary | ICD-10-CM

## 2018-08-23 DIAGNOSIS — Z8619 Personal history of other infectious and parasitic diseases: Secondary | ICD-10-CM

## 2018-08-23 DIAGNOSIS — I10 Essential (primary) hypertension: Secondary | ICD-10-CM

## 2018-08-23 DIAGNOSIS — I420 Dilated cardiomyopathy: Secondary | ICD-10-CM | POA: Diagnosis not present

## 2018-08-23 DIAGNOSIS — Z8616 Personal history of COVID-19: Secondary | ICD-10-CM

## 2018-08-23 NOTE — Patient Instructions (Signed)
Medication Instructions:  Your physician recommends that you continue on your current medications as directed. Please refer to the Current Medication list given to you today.  If you need a refill on your cardiac medications before your next appointment, please call your pharmacy.   Lab work: None.  If you have labs (blood work) drawn today and your tests are completely normal, you will receive your results only by: Marland Kitchen MyChart Message (if you have MyChart) OR . A paper copy in the mail If you have any lab test that is abnormal or we need to change your treatment, we will call you to review the results.  Testing/Procedures: None.   Follow-Up: At [facility], you and your health needs are our priority.  As part of our continuing mission to provide you with exceptional heart care, we have created designated Provider Care Teams.  These Care Teams include your primary Cardiologist (physician) and Advanced Practice Providers (APPs -  Physician Assistants and Nurse Practitioners) who all work together to provide you with the care you need, when you need it. You will need a follow up appointment in 3 weeks.  Please call our office 2 months in advance to schedule this appointment.  You may see No primary care provider on file. or another member of our [employer] Provider Team in [place]: Shirlee More, MD . Jyl Heinz, MD  Any Other Special Instructions Will Be Listed Below (If Applicable).

## 2018-08-23 NOTE — Telephone Encounter (Signed)
Left message for patient to return cal to go over discharge instructions from todays visit with Dr. Agustin Cree and to schedule follow up appointment.

## 2018-08-23 NOTE — Progress Notes (Signed)
Virtual Visit via Video Note   This visit type was conducted due to national recommendations for restrictions regarding the COVID-19 Pandemic (e.g. social distancing) in an effort to limit this patient's exposure and mitigate transmission in our community.  Due to his co-morbid illnesses, this patient is at least at moderate risk for complications without adequate follow up.  This format is felt to be most appropriate for this patient at this time.  All issues noted in this document were discussed and addressed.  A limited physical exam was performed with this format.  Please refer to the patient's chart for his consent to telehealth for [facility].  Evaluation Performed:  Follow-up visit  This visit type was conducted due to national recommendations for restrictions regarding the COVID-19 Pandemic (e.g. social distancing).  This format is felt to be most appropriate for this patient at this time.  All issues noted in this document were discussed and addressed.  No physical exam was performed (except for noted visual exam findings with Video Visits).  Please refer to the patient's chart (MyChart message for video visits and phone note for telephone visits) for the patient's consent to telehealth for [facility].  Date:  08/23/2018  ID: Raymond Alvarez, DOB Jul 31, 1955, MRN [id number]   Patient Location: [address] [place] [address]   Provider location:   [place] Office  PCP:  Raina Mina., MD  Cardiologist:  Jenne Campus, MD     Chief Complaint: I am doing well  History of Present Illness:    Raymond Alvarez is a 63 y.o. male  who presents via audio/video conferencing for a telehealth visit today.  With cardiomyopathy which could be related to cough and infection that he did suffer from a at the beginning of this year.  Gradually I am trying to put him on right medications.  Some difficulty with low blood pressure but overall seems to be doing well today I  told him to increase dose of Entresto to 2 tablets in the morning 1 tablet in the afternoon everything else will be the same we will talk again about 2 weeks.  He did see EP team with consideration of ICD and by V pacing.  It would be beneficial for him.  I was surprised how quickly he was able to see them.  We need to wait 90 days after diagnosis and appropriate treatment before pursuing ICD with a by V pacing.   The patient does not have symptoms concerning for COVID-19 infection (fever, chills, cough, or new SHORTNESS OF BREATH).    Prior CV studies:   The following studies were reviewed today:       Past Medical History:  Diagnosis Date  . Anemia   . Anxiety   . Asthma   . Chronic kidney disease   . Colon polyp   . Depression   . Diverticulitis   . GERD (gastroesophageal reflux disease)   . Gout   . Hypertension   . Prostate cancer ([place])   . Seasonal allergies   . Wears glasses     Past Surgical History:  Procedure Laterality Date  . APPENDECTOMY  1970  . COLONOSCOPY  09/24/2016   Colonic polyp status post polypectomy. Tubular adenoma.   . ESOPHAGOGASTRODUODENOSCOPY  12/10/2016   Small hiatal hernia. Mild gastritis. Gastric polyps status post polypectomy x 4   . MASS EXCISION Left 12/15/2013   Procedure: EXCISION MUCOID TUMOR LEFT INDEX DEBRIDEMENT DISTAL INTERPHALANGEAL JOINT LEFT INDEX FINGER;  Surgeon: Daryll Brod, MD;  Location: [place];  Service: Orthopedics;  Laterality: Left;  . PROSTATECTOMY  2015  . TONSILLECTOMY       Current Meds  Medication Sig  . albuterol (PROVENTIL HFA;VENTOLIN HFA) 108 (90 Base) MCG/ACT inhaler Inhale 2 puffs into the lungs every 4 (four) hours as needed for wheezing or shortness of breath.  . allopurinol (ZYLOPRIM) 300 MG tablet Take 300 mg by mouth daily.  . carvedilol (COREG) 6.25 MG tablet Take 1 tablet (6.25 mg total) by mouth 2 (two) times daily.  . cyanocobalamin (,VITAMIN B-12,) 1000 MCG/ML injection  Inject 1,000 mcg into the muscle every 30 (thirty) days.  Marland Kitchen dicyclomine (BENTYL) 10 MG capsule Take 1 capsule (10 mg total) by mouth 4 (four) times daily -  before meals and at bedtime.  . ferrous sulfate 325 (65 FE) MG EC tablet Take 325 mg by mouth 3 (three) times a week.  . furosemide (LASIX) 20 MG tablet Take 1 tablet (20 mg total) by mouth daily.  Marland Kitchen guaiFENesin (MUCINEX) 600 MG 12 hr tablet Take 2 tablets (1,200 mg total) by mouth 2 (two) times daily.  . hyoscyamine (LEVSIN SL) 0.125 MG SL tablet Place 1 tablet (0.125 mg total) under the tongue every 4 (four) hours as needed. (Patient taking differently: Place 0.125 mg under the tongue every 4 (four) hours as needed for cramping. )  . LORazepam (ATIVAN) 1 MG tablet Take 1 mg by mouth daily as needed for anxiety.  . methocarbamol (ROBAXIN) 750 MG tablet Take 750 mg by mouth 4 (four) times daily.  . Olopatadine HCl (PAZEO) 0.7 % SOLN Place 1 drop into both eyes daily.  Marland Kitchen omeprazole (PRILOSEC) 20 MG capsule Take 20 mg by mouth daily.  . sacubitril-valsartan (ENTRESTO) 24-26 MG Take 1 tablet by mouth 2 (two) times daily.  Marland Kitchen spironolactone (ALDACTONE) 25 MG tablet Take 1 tablet by mouth daily.  . vitamin C (VITAMIN C) 500 MG tablet Take 1 tablet (500 mg total) by mouth daily.  Marland Kitchen vortioxetine HBr (TRINTELLIX) 20 MG TABS tablet Take 20 mg by mouth daily.   Marland Kitchen zinc sulfate 220 (50 Zn) MG capsule Take 1 capsule (220 mg total) by mouth daily.      Family History: The patient's family history includes Breast cancer in his mother; Prostate cancer in his father and maternal grandfather.   ROS:   Please see the history of present illness.     All other systems reviewed and are negative.   Labs/Other Tests and Data Reviewed:     Recent Labs: 05/12/2018: TSH 0.939 05/17/2018: ALT 72; Hemoglobin 14.5; Magnesium 2.2; Platelets 367 07/23/2018: BUN 15; Creatinine, Ser 1.21; Potassium 4.6; Sodium 139  Recent Lipid Panel No results found for: CHOL,  TRIG, HDL, CHOLHDL, VLDL, LDLCALC, LDLDIRECT    Exam:    Vital Signs:  Wt 284 lb (128.8 kg)   BMI 36.46 kg/m     Wt Readings from Last 3 Encounters:  08/23/18 284 lb (128.8 kg)  08/05/18 287 lb (130.2 kg)  07/23/18 284 lb (128.8 kg)     Well nourished, well developed in no acute distress. Alert awake and x3 with talk over the video link he is asymptomatic and doing well  Diagnosis for this visit:   1. Dilated cardiomyopathy ([place])   2. LBBB (left bundle branch block)   3. Benign essential hypertension   4. History of 2019 novel coronavirus disease (COVID-19)      ASSESSMENT & PLAN:  1.  Dilated cardiomyopathy asking to increase the dose of Entresto to 2 tablets in the morning 1 in the afternoon and let us know how he feels I see him quickly within next 2 to 3 weeks for short visit. 2.  Left bundle branch block chronic problem 3.  Benign essential hypertension doing well from that point review  COVID-19 Education: The signs and symptoms of COVID-19 were discussed with the patient and how to seek care for testing (follow up with PCP or arrange E-visit).  The importance of social distancing was discussed today.  Patient Risk:   After full review of this patients clinical status, I feel that they are at least moderate risk at this time.  Time:   Today, I have spent 15 minutes with the patient with telehealth technology discussing pt health issues.  I spent 5 minutes reviewing her chart before the visit.  Visit was finished at 4:31 PM.    Medication Adjustments/Labs and Tests Ordered: Current medicines are reviewed at length with the patient today.  Concerns regarding medicines are outlined above.  No orders of the defined types were placed in this encounter.  Medication changes: No orders of the defined types were placed in this encounter.    Disposition: Follow-up in 3 weeks  Signed, Park Liter, MD, [facility] 08/23/2018 4:32 PM    [place]

## 2018-08-24 ENCOUNTER — Telehealth: Payer: Self-pay | Admitting: Emergency Medicine

## 2018-08-24 NOTE — Telephone Encounter (Signed)
Called patient informed him to take entresto 24/26 mg twice daily per Dr. Agustin Cree.

## 2018-08-24 NOTE — Telephone Encounter (Signed)
Patient is calling to report he is only taking entresto once daily and wants to confirm that he needs to increase to twice daily. Will consult with Dr. Agustin Cree.

## 2018-08-24 NOTE — Telephone Encounter (Signed)
Yes he needs to take Entresto BID

## 2018-09-27 ENCOUNTER — Encounter: Payer: Self-pay | Admitting: Cardiology

## 2018-09-27 ENCOUNTER — Other Ambulatory Visit: Payer: Self-pay

## 2018-09-27 ENCOUNTER — Telehealth (INDEPENDENT_AMBULATORY_CARE_PROVIDER_SITE_OTHER): Payer: BC Managed Care – PPO | Admitting: Cardiology

## 2018-09-27 VITALS — BP 130/90 | Wt 289.0 lb

## 2018-09-27 DIAGNOSIS — I447 Left bundle-branch block, unspecified: Secondary | ICD-10-CM

## 2018-09-27 DIAGNOSIS — I420 Dilated cardiomyopathy: Secondary | ICD-10-CM | POA: Diagnosis not present

## 2018-09-27 DIAGNOSIS — R002 Palpitations: Secondary | ICD-10-CM

## 2018-09-27 DIAGNOSIS — Z8619 Personal history of other infectious and parasitic diseases: Secondary | ICD-10-CM

## 2018-09-27 DIAGNOSIS — Z8616 Personal history of COVID-19: Secondary | ICD-10-CM

## 2018-09-27 DIAGNOSIS — I493 Ventricular premature depolarization: Secondary | ICD-10-CM

## 2018-09-27 NOTE — Progress Notes (Signed)
Virtual Visit via Video Note   This visit type was conducted due to national recommendations for restrictions regarding the COVID-19 Pandemic (e.g. social distancing) in an effort to limit this patient's exposure and mitigate transmission in our community.  Due to his co-morbid illnesses, this patient is at least at moderate risk for complications without adequate follow up.  This format is felt to be most appropriate for this patient at this time.  All issues noted in this document were discussed and addressed.  A limited physical exam was performed with this format.  Please refer to the patient's chart for his consent to telehealth for [facility].  Evaluation Performed:  Follow-up visit  This visit type was conducted due to national recommendations for restrictions regarding the COVID-19 Pandemic (e.g. social distancing).  This format is felt to be most appropriate for this patient at this time.  All issues noted in this document were discussed and addressed.  No physical exam was performed (except for noted visual exam findings with Video Visits).  Please refer to the patient's chart (MyChart message for video visits and phone note for telephone visits) for the patient's consent to telehealth for [facility]  Date:  09/27/2018  ID: Raymond Alvarez, DOB October 05, 1955, MRN [id number]   Patient Location: [address] [place] [address]   Provider location:   [place] Office  PCP:  Raina Mina., MD  Cardiologist:  Jenne Campus, MD     Chief Complaint: Doing well   History of Present Illness:    Raymond Alvarez is a 63 y.o. male  who presents via audio/video conferencing for a telehealth visit today.  With history of coronavirus infection.  After that he was discovered to have cardiomyopathy with severely diminished left ventricular ejection fraction left bundle branch block.  Things been struggling tried to put him on the right medications with some difficulties  however gradually he is getting better in terms of able to tolerate medications.  I asked him to come to my office in [place] over the next few days to have Chem-7 done if Chem-7 is an appropriate level then will double the dose of Entresto.  Also asked him to check blood pressure on a regular basis and we will call him next week to find out what his blood pressure is doing.  Based on that we will try to augment his therapy.  He is already on beta-blocker Entresto as well as Aldactone which I will continue.  He did see EP team with consideration for ICD.   The patient does not have symptoms concerning for COVID-19 infection (fever, chills, cough, or new SHORTNESS OF BREATH).    Prior CV studies:   The following studies were reviewed today:       Past Medical History:  Diagnosis Date  . Anemia   . Anxiety   . Asthma   . Chronic kidney disease   . Colon polyp   . Depression   . Diverticulitis   . GERD (gastroesophageal reflux disease)   . Gout   . Hypertension   . Prostate cancer ([place])   . Seasonal allergies   . Wears glasses     Past Surgical History:  Procedure Laterality Date  . APPENDECTOMY  1970  . COLONOSCOPY  09/24/2016   Colonic polyp status post polypectomy. Tubular adenoma.   . ESOPHAGOGASTRODUODENOSCOPY  12/10/2016   Small hiatal hernia. Mild gastritis. Gastric polyps status post polypectomy x 4   . MASS EXCISION Left  12/15/2013   Procedure: EXCISION MUCOID TUMOR LEFT INDEX DEBRIDEMENT DISTAL INTERPHALANGEAL JOINT LEFT INDEX FINGER;  Surgeon: Daryll Brod, MD;  Location: [facility];  Service: Orthopedics;  Laterality: Left;  . PROSTATECTOMY  2015  . TONSILLECTOMY       Current Meds  Medication Sig  . albuterol (PROVENTIL HFA;VENTOLIN HFA) 108 (90 Base) MCG/ACT inhaler Inhale 2 puffs into the lungs every 4 (four) hours as needed for wheezing or shortness of breath.  . allopurinol (ZYLOPRIM) 300 MG tablet Take 300 mg by mouth daily.  . carvedilol  (COREG) 6.25 MG tablet Take 1 tablet (6.25 mg total) by mouth 2 (two) times daily.  . cyanocobalamin (,VITAMIN B-12,) 1000 MCG/ML injection Inject 1,000 mcg into the muscle every 30 (thirty) days.  Marland Kitchen dicyclomine (BENTYL) 10 MG capsule Take 1 capsule (10 mg total) by mouth 4 (four) times daily -  before meals and at bedtime.  . ferrous sulfate 325 (65 FE) MG EC tablet Take 325 mg by mouth 3 (three) times a week.  . furosemide (LASIX) 20 MG tablet Take 1 tablet (20 mg total) by mouth daily.  Marland Kitchen guaiFENesin (MUCINEX) 600 MG 12 hr tablet Take 2 tablets (1,200 mg total) by mouth 2 (two) times daily.  . hyoscyamine (LEVSIN SL) 0.125 MG SL tablet Place 1 tablet (0.125 mg total) under the tongue every 4 (four) hours as needed. (Patient taking differently: Place 0.125 mg under the tongue every 4 (four) hours as needed for cramping. )  . LORazepam (ATIVAN) 1 MG tablet Take 1 mg by mouth daily as needed for anxiety.  . methocarbamol (ROBAXIN) 750 MG tablet Take 750 mg by mouth 4 (four) times daily.  . Olopatadine HCl (PAZEO) 0.7 % SOLN Place 1 drop into both eyes daily.  Marland Kitchen omeprazole (PRILOSEC) 20 MG capsule Take 20 mg by mouth daily.  . sacubitril-valsartan (ENTRESTO) 24-26 MG Take 1 tablet by mouth 2 (two) times daily.  Marland Kitchen spironolactone (ALDACTONE) 25 MG tablet Take 1 tablet by mouth daily.  . vitamin C (VITAMIN C) 500 MG tablet Take 1 tablet (500 mg total) by mouth daily.  Marland Kitchen vortioxetine HBr (TRINTELLIX) 20 MG TABS tablet Take 20 mg by mouth daily.   Marland Kitchen zinc sulfate 220 (50 Zn) MG capsule Take 1 capsule (220 mg total) by mouth daily.      Family History: The patient's family history includes Breast cancer in his mother; Prostate cancer in his father and maternal grandfather.   ROS:   Please see the history of present illness.     All other systems reviewed and are negative.   Labs/Other Tests and Data Reviewed:     Recent Labs: 05/12/2018: TSH 0.939 05/17/2018: ALT 72; Hemoglobin 14.5; Magnesium  2.2; Platelets 367 07/23/2018: BUN 15; Creatinine, Ser 1.21; Potassium 4.6; Sodium 139  Recent Lipid Panel No results found for: CHOL, TRIG, HDL, CHOLHDL, VLDL, LDLCALC, LDLDIRECT    Exam:    Vital Signs:  BP 130/90   Wt 289 lb (131.1 kg)   BMI 37.11 kg/m     Wt Readings from Last 3 Encounters:  09/27/18 289 lb (131.1 kg)  08/23/18 284 lb (128.8 kg)  08/05/18 287 lb (130.2 kg)     Well nourished, well developed in no acute distress. Alert awake and x3 happy to be able to talk to me over the phone denies having any issues  Diagnosis for this visit:   1. Dilated cardiomyopathy ([place])   2. LBBB (left bundle branch block)  3. PVC (premature ventricular contraction)   4. Palpitations   5. History of 2019 novel coronavirus disease (COVID-19)      ASSESSMENT & PLAN:    1.  Dilated cardiomyopathy plan as outlined above 2.  Left bundle branch block if he still have diminished ejection fraction in the future we will required by V pacing. 3.  PVCs denies having any no palpitations. History of COVID-19 infection he is asking me if his heart problem is related to call with and I cannot answer direct the discretion it is a good possibility that his cardiomyopathy is related to viral infection that he had.  COVID-19 Education: The signs and symptoms of COVID-19 were discussed with the patient and how to seek care for testing (follow up with PCP or arrange E-visit).  The importance of social distancing was discussed today.  Patient Risk:   After full review of this patients clinical status, I feel that they are at least moderate risk at this time.  Time:   Today, I have spent 15 minutes with the patient with telehealth technology discussing pt health issues.  I spent 5 minutes reviewing her chart before the visit.  Visit was finished at 1:59 PM.    Medication Adjustments/Labs and Tests Ordered: Current medicines are reviewed at length with the patient today.  Concerns regarding  medicines are outlined above.  No orders of the defined types were placed in this encounter.  Medication changes: No orders of the defined types were placed in this encounter.    Disposition: Follow-up 1 month  Signed, Park Liter, MD, [facility] 09/27/2018 1:57 PM    [place]

## 2018-09-27 NOTE — Addendum Note (Signed)
Addended by: Ashok Norris on: 09/27/2018 02:16 PM   Modules accepted: Orders

## 2018-09-27 NOTE — Patient Instructions (Signed)
Medication Instructions:  Your physician recommends that you continue on your current medications as directed. Please refer to the Current Medication list given to you today.  If you need a refill on your cardiac medications before your next appointment, please call your pharmacy.   Lab work: Your physician recommends that you return for lab work this week: bmp   If you have labs (blood work) drawn today and your tests are completely normal, you will receive your results only by: Marland Kitchen MyChart Message (if you have MyChart) OR . A paper copy in the mail If you have any lab test that is abnormal or we need to change your treatment, we will call you to review the results.  Testing/Procedures: None.   Follow-Up: At [facility], you and your health needs are our priority.  As part of our continuing mission to provide you with exceptional heart care, we have created designated Provider Care Teams.  These Care Teams include your primary Cardiologist (physician) and Advanced Practice Providers (APPs -  Physician Assistants and Nurse Practitioners) who all work together to provide you with the care you need, when you need it. You will need a follow up appointment in 1 months.  Please call our office 2 months in advance to schedule this appointment.  You may see No primary care provider on file. or another member of our [employer] Provider Team in [place]: Shirlee More, MD . Jyl Heinz, MD  Any Other Special Instructions Will Be Listed Below (If Applicable).

## 2018-10-01 ENCOUNTER — Other Ambulatory Visit: Payer: Self-pay | Admitting: Cardiology

## 2018-10-05 LAB — BASIC METABOLIC PANEL
BUN/Creatinine Ratio: 13 (ref 10–24)
BUN: 17 mg/dL (ref 8–27)
CO2: 23 mmol/L (ref 20–29)
Calcium: 9.6 mg/dL (ref 8.6–10.2)
Chloride: 103 mmol/L (ref 96–106)
Creatinine, Ser: 1.27 mg/dL (ref 0.76–1.27)
GFR calc Af Amer: 70 mL/min/{1.73_m2} (ref >59)
GFR calc non Af Amer: 61 mL/min/{1.73_m2} (ref >59)
Glucose: 117 mg/dL — ABNORMAL HIGH (ref 65–99)
Potassium: 4.7 mmol/L (ref 3.5–5.2)
Sodium: 139 mmol/L (ref 134–144)

## 2018-10-06 ENCOUNTER — Telehealth: Payer: Self-pay | Admitting: Emergency Medicine

## 2018-10-06 DIAGNOSIS — I10 Essential (primary) hypertension: Secondary | ICD-10-CM

## 2018-10-06 NOTE — Telephone Encounter (Signed)
Left message for patient to return call to check on blood pressure.

## 2018-10-07 NOTE — Telephone Encounter (Signed)
Please call patient back

## 2018-10-08 MED ORDER — ENTRESTO 24-26 MG PO TABS: 2.0000 | ORAL_TABLET | Freq: Two times a day (BID) | ORAL | 1 refills | Status: DC

## 2018-10-08 NOTE — Telephone Encounter (Signed)
Called patient back. Informed him of lab results and to increase entresto to 24/26 mg 2 tablets in the morning and 2 tablets in the evening daily. Patient verbally understands, no further questions.   Patient also reports his blood pressure has been consistently around 125/90. Will inform Dr. Agustin Cree.

## 2018-10-08 NOTE — Addendum Note (Signed)
Addended by: Ashok Norris on: 10/08/2018 03:12 PM   Modules accepted: Orders

## 2018-10-12 ENCOUNTER — Other Ambulatory Visit: Payer: Self-pay | Admitting: Cardiology

## 2018-10-16 LAB — BASIC METABOLIC PANEL WITH GFR
BUN/Creatinine Ratio: 20 (ref 10–24)
BUN: 22 mg/dL (ref 8–27)
CO2: 20 mmol/L (ref 20–29)
Calcium: 9.6 mg/dL (ref 8.6–10.2)
Chloride: 102 mmol/L (ref 96–106)
Creatinine, Ser: 1.12 mg/dL (ref 0.76–1.27)
GFR calc Af Amer: 82 mL/min/{1.73_m2}
GFR calc non Af Amer: 71 mL/min/{1.73_m2}
Glucose: 99 mg/dL (ref 65–99)
Potassium: 4.5 mmol/L (ref 3.5–5.2)
Sodium: 136 mmol/L (ref 134–144)

## 2018-11-01 ENCOUNTER — Ambulatory Visit (INDEPENDENT_AMBULATORY_CARE_PROVIDER_SITE_OTHER): Payer: BC Managed Care – PPO | Admitting: Cardiology

## 2018-11-01 ENCOUNTER — Encounter: Payer: Self-pay | Admitting: Cardiology

## 2018-11-01 ENCOUNTER — Other Ambulatory Visit: Payer: Self-pay

## 2018-11-01 VITALS — BP 110/80 | HR 85 | Ht 74.0 in | Wt 274.6 lb

## 2018-11-01 DIAGNOSIS — I447 Left bundle-branch block, unspecified: Secondary | ICD-10-CM

## 2018-11-01 DIAGNOSIS — Z8619 Personal history of other infectious and parasitic diseases: Secondary | ICD-10-CM | POA: Diagnosis not present

## 2018-11-01 DIAGNOSIS — I10 Essential (primary) hypertension: Secondary | ICD-10-CM

## 2018-11-01 DIAGNOSIS — I420 Dilated cardiomyopathy: Secondary | ICD-10-CM

## 2018-11-01 DIAGNOSIS — Z8616 Personal history of COVID-19: Secondary | ICD-10-CM

## 2018-11-01 MED ORDER — CARVEDILOL 12.5 MG PO TABS: 12.5000 mg | ORAL_TABLET | Freq: Two times a day (BID) | ORAL | 1 refills | Status: DC

## 2018-11-01 MED ORDER — ENTRESTO 49-51 MG PO TABS: 1.0000 | ORAL_TABLET | Freq: Two times a day (BID) | ORAL | 1 refills | Status: DC

## 2018-11-01 NOTE — Addendum Note (Signed)
Addended by: Ashok Norris on: 11/01/2018 11:20 AM   Modules accepted: Orders

## 2018-11-01 NOTE — Progress Notes (Signed)
Cardiology Office Note:    Date:  11/01/2018   ID:  Raymond Alvarez, DOB April 27, 1955, MRN [id number]  PCP:  Raina Mina., MD  Cardiologist:  Jenne Campus, MD    Referring MD: Raina Mina., MD   Chief Complaint  Patient presents with  . Follow-up  Doing well  History of Present Illness:    Raymond Alvarez is a 63 y.o. male with history of COVID-19 infection, after that he developed cardiomyopathy with severely diminished left ventricular ejection fraction, also left bundle branch block.  Gradually I am trying to put him on right medications.  Today we will do EKG if EKG is fine we will try to double the dose of Coreg.  He did see already EP team with consideration of CRT/D.  Will redo echocardiogram at the end of next month and then if ejection fraction still diminished he be referred back to them for consideration of implantation of the device.  Overall he seems to be doing well denies having a chest pain tightness squeezing pressure burning chest.  He reports to have some days when he is very weak tired exhausted but overall seems to be doing well  Past Medical History:  Diagnosis Date  . Anemia   . Anxiety   . Asthma   . Chronic kidney disease   . Colon polyp   . Depression   . Diverticulitis   . GERD (gastroesophageal reflux disease)   . Gout   . Hypertension   . Prostate cancer ([place])   . Seasonal allergies   . Wears glasses     Past Surgical History:  Procedure Laterality Date  . APPENDECTOMY  1970  . COLONOSCOPY  09/24/2016   Colonic polyp status post polypectomy. Tubular adenoma.   . ESOPHAGOGASTRODUODENOSCOPY  12/10/2016   Small hiatal hernia. Mild gastritis. Gastric polyps status post polypectomy x 4   . MASS EXCISION Left 12/15/2013   Procedure: EXCISION MUCOID TUMOR LEFT INDEX DEBRIDEMENT DISTAL INTERPHALANGEAL JOINT LEFT INDEX FINGER;  Surgeon: Daryll Brod, MD;  Location: [facility];  Service: Orthopedics;  Laterality: Left;  . PROSTATECTOMY   2015  . TONSILLECTOMY      Current Medications: Current Meds  Medication Sig  . albuterol (PROVENTIL HFA;VENTOLIN HFA) 108 (90 Base) MCG/ACT inhaler Inhale 2 puffs into the lungs every 4 (four) hours as needed for wheezing or shortness of breath.  . allopurinol (ZYLOPRIM) 300 MG tablet Take 300 mg by mouth daily.  . carvedilol (COREG) 6.25 MG tablet TAKE 1 TABLET(6.25 MG) BY MOUTH TWICE DAILY  . cyanocobalamin (,VITAMIN B-12,) 1000 MCG/ML injection Inject 1,000 mcg into the muscle every 30 (thirty) days.  Marland Kitchen dicyclomine (BENTYL) 10 MG capsule Take 1 capsule (10 mg total) by mouth 4 (four) times daily -  before meals and at bedtime.  . ferrous sulfate 325 (65 FE) MG EC tablet Take 325 mg by mouth 3 (three) times a week.  . furosemide (LASIX) 20 MG tablet Take 1 tablet (20 mg total) by mouth daily.  Marland Kitchen guaiFENesin (MUCINEX) 600 MG 12 hr tablet Take 2 tablets (1,200 mg total) by mouth 2 (two) times daily.  . hyoscyamine (LEVSIN SL) 0.125 MG SL tablet Place 1 tablet (0.125 mg total) under the tongue every 4 (four) hours as needed. (Patient taking differently: Place 0.125 mg under the tongue every 4 (four) hours as needed for cramping. )  . LORazepam (ATIVAN) 1 MG tablet Take 1 mg by mouth daily as needed for anxiety.  . methocarbamol (  ROBAXIN) 750 MG tablet Take 750 mg by mouth 4 (four) times daily.  . Olopatadine HCl (PAZEO) 0.7 % SOLN Place 1 drop into both eyes daily.  Marland Kitchen omeprazole (PRILOSEC) 20 MG capsule Take 20 mg by mouth daily.  . sacubitril-valsartan (ENTRESTO) 24-26 MG Take 2 tablets by mouth 2 (two) times daily.  Marland Kitchen spironolactone (ALDACTONE) 25 MG tablet Take 1 tablet by mouth daily.  . vitamin C (VITAMIN C) 500 MG tablet Take 1 tablet (500 mg total) by mouth daily.  Marland Kitchen vortioxetine HBr (TRINTELLIX) 20 MG TABS tablet Take 20 mg by mouth daily.   Marland Kitchen zinc sulfate 220 (50 Zn) MG capsule Take 1 capsule (220 mg total) by mouth daily.     Allergies:   Other and Sulfa antibiotics   Social  History   Socioeconomic History  . Marital status: Married    Spouse name: Not on file  . Number of children: 2  . Years of education: Not on file  . Highest education level: Not on file  Occupational History  . Occupation: Sport and exercise psychologist  Social Needs  . Financial resource strain: Not on file  . Food insecurity    Worry: Not on file    Inability: Not on file  . Transportation needs    Medical: Not on file    Non-medical: Not on file  Tobacco Use  . Smoking status: Never Smoker  . Smokeless tobacco: Never Used  Substance and Sexual Activity  . Alcohol use: Yes    Comment: occ  . Drug use: No  . Sexual activity: Not on file  Lifestyle  . Physical activity    Days per week: Not on file    Minutes per session: Not on file  . Stress: Not on file  Relationships  . Social Herbalist on phone: Not on file    Gets together: Not on file    Attends religious service: Not on file    Active member of club or organization: Not on file    Attends meetings of clubs or organizations: Not on file    Relationship status: Not on file  Other Topics Concern  . Not on file  Social History Narrative  . Not on file     Family History: The patient's family history includes Breast cancer in his mother; Prostate cancer in his father and maternal grandfather. ROS:   Please see the history of present illness.    All 14 point review of systems negative except as described per history of present illness  EKGs/Labs/Other Studies Reviewed:      Recent Labs: 05/12/2018: TSH 0.939 05/17/2018: ALT 72; Hemoglobin 14.5; Magnesium 2.2; Platelets 367 10/15/2018: BUN 22; Creatinine, Ser 1.12; Potassium 4.5; Sodium 136  Recent Lipid Panel No results found for: CHOL, TRIG, HDL, CHOLHDL, VLDL, LDLCALC, LDLDIRECT  Physical Exam:    VS:  BP 110/80   Pulse 85   Ht 6\' 2"  (1.88 m)   Wt 274 lb 9.6 oz (124.6 kg)   SpO2 99%   BMI 35.26 kg/m     Wt Readings from Last 3 Encounters:   11/01/18 274 lb 9.6 oz (124.6 kg)  09/27/18 289 lb (131.1 kg)  08/23/18 284 lb (128.8 kg)     GEN:  Well nourished, well developed in no acute distress HEENT: Normal NECK: No JVD; No carotid bruits LYMPHATICS: No lymphadenopathy CARDIAC: RRR, no murmurs, no rubs, no gallops RESPIRATORY:  Clear to auscultation without rales, wheezing or rhonchi  ABDOMEN:  Soft, non-tender, non-distended MUSCULOSKELETAL:  No edema; No deformity  SKIN: Warm and dry LOWER EXTREMITIES: no swelling NEUROLOGIC:  Alert and oriented x 3 PSYCHIATRIC:  Normal affect   ASSESSMENT:    1. Dilated cardiomyopathy ([place])   2. LBBB (left bundle branch block)   3. Benign essential hypertension   4. History of 2019 novel coronavirus disease (COVID-19)    PLAN:    In order of problems listed above:  1. Dilated cardiomyopathy.  Plan as outlined above.  Stable hemodynamically 2. Left bundle branch block.  He can benefit from CRT-D but waiting for 3 months of adequate medical therapy. 3. Essential hypertension blood pressure well controlled continue present management 4. History of COVID-19.  Noted   Medication Adjustments/Labs and Tests Ordered: Current medicines are reviewed at length with the patient today.  Concerns regarding medicines are outlined above.  No orders of the defined types were placed in this encounter.  Medication changes: No orders of the defined types were placed in this encounter.   Signed, Park Liter, MD, [facility] 11/01/2018 11:09 AM    [place]

## 2018-11-01 NOTE — Addendum Note (Signed)
Addended by: Ashok Norris on: 11/01/2018 11:14 AM   Modules accepted: Orders

## 2018-11-01 NOTE — Addendum Note (Signed)
Addended by: Ashok Norris on: 11/01/2018 04:22 PM   Modules accepted: Orders

## 2018-11-01 NOTE — Patient Instructions (Addendum)
Medication Instructions:  Your physician has recommended you make the following change in your medication:  INCREASE: COREG to 12.5 mg twice daily   If you need a refill on your cardiac medications before your next appointment, please call your pharmacy.   Lab work: None.  If you have labs (blood work) drawn today and your tests are completely normal, you will receive your results only by: Marland Kitchen MyChart Message (if you have MyChart) OR . A paper copy in the mail If you have any lab test that is abnormal or we need to change your treatment, we will call you to review the results.  Testing/Procedures: Your physician has requested that you have an echocardiogram. Echocardiography is a painless test that uses sound waves to create images of your heart. It provides your doctor with information about the size and shape of your heart and how well your heart's chambers and valves are working. This procedure takes approximately one hour. There are no restrictions for this procedure.    Follow-Up: At [facility], you and your health needs are our priority.  As part of our continuing mission to provide you with exceptional heart care, we have created designated Provider Care Teams.  These Care Teams include your primary Cardiologist (physician) and Advanced Practice Providers (APPs -  Physician Assistants and Nurse Practitioners) who all work together to provide you with the care you need, when you need it. You will need a follow up appointment in 3 months.  Please call our office 2 months in advance to schedule this appointment.  You may see No primary care provider on file. or another member of our [employer] Provider Team in [place]: Shirlee More, MD . Jyl Heinz, MD  Any Other Special Instructions Will Be Listed Below (If Applicable).   Echocardiogram An echocardiogram is a procedure that uses painless sound waves (ultrasound) to produce an image of the heart. Images from an  echocardiogram can provide important information about:  Signs of coronary artery disease (CAD).  Aneurysm detection. An aneurysm is a weak or damaged part of an artery wall that bulges out from the normal force of blood pumping through the body.  Heart size and shape. Changes in the size or shape of the heart can be associated with certain conditions, including heart failure, aneurysm, and CAD.  Heart muscle function.  Heart valve function.  Signs of a past heart attack.  Fluid buildup around the heart.  Thickening of the heart muscle.  A tumor or infectious growth around the heart valves. Tell a health care provider about:  Any allergies you have.  All medicines you are taking, including vitamins, herbs, eye drops, creams, and over-the-counter medicines.  Any blood disorders you have.  Any surgeries you have had.  Any medical conditions you have.  Whether you are pregnant or may be pregnant. What are the risks? Generally, this is a safe procedure. However, problems may occur, including:  Allergic reaction to dye (contrast) that may be used during the procedure. What happens before the procedure? No specific preparation is needed. You may eat and drink normally. What happens during the procedure?   An IV tube may be inserted into one of your veins.  You may receive contrast through this tube. A contrast is an injection that improves the quality of the pictures from your heart.  A gel will be applied to your chest.  A wand-like tool (transducer) will be moved over your chest. The gel will help to transmit the  sound waves from the transducer.  The sound waves will harmlessly bounce off of your heart to allow the heart images to be captured in real-time motion. The images will be recorded on a computer. The procedure may vary among health care providers and hospitals. What happens after the procedure?  You may return to your normal, everyday life, including diet,  activities, and medicines, unless your health care provider tells you not to do that. Summary  An echocardiogram is a procedure that uses painless sound waves (ultrasound) to produce an image of the heart.  Images from an echocardiogram can provide important information about the size and shape of your heart, heart muscle function, heart valve function, and fluid buildup around your heart.  You do not need to do anything to prepare before this procedure. You may eat and drink normally.  After the echocardiogram is completed, you may return to your normal, everyday life, unless your health care provider tells you not to do that. This information is not intended to replace advice given to you by your health care provider. Make sure you discuss any questions you have with your health care provider. Document Released: 01/18/2000 Document Revised: 05/13/2018 Document Reviewed: 02/23/2016 Elsevier Patient Education  2020 [employer].

## 2018-11-05 ENCOUNTER — Ambulatory Visit: Payer: BC Managed Care – PPO | Admitting: Cardiology

## 2018-12-03 ENCOUNTER — Other Ambulatory Visit: Payer: Self-pay

## 2018-12-03 ENCOUNTER — Ambulatory Visit (INDEPENDENT_AMBULATORY_CARE_PROVIDER_SITE_OTHER): Payer: BC Managed Care – PPO

## 2018-12-03 DIAGNOSIS — I447 Left bundle-branch block, unspecified: Secondary | ICD-10-CM | POA: Diagnosis not present

## 2018-12-03 DIAGNOSIS — I420 Dilated cardiomyopathy: Secondary | ICD-10-CM | POA: Diagnosis not present

## 2018-12-03 LAB — ECHOCARDIOGRAM COMPLETE: Single Plane A4C EF: 24.5 %

## 2018-12-03 NOTE — Progress Notes (Signed)
Complete echocardiogram has been performed.  Jimmy Jasson Siegmann RDCS, RVT 

## 2018-12-27 ENCOUNTER — Other Ambulatory Visit: Payer: Self-pay

## 2018-12-27 ENCOUNTER — Encounter: Payer: Self-pay | Admitting: Cardiology

## 2018-12-27 ENCOUNTER — Ambulatory Visit (INDEPENDENT_AMBULATORY_CARE_PROVIDER_SITE_OTHER): Payer: BC Managed Care – PPO | Admitting: Cardiology

## 2018-12-27 VITALS — BP 94/60 | HR 79 | Ht 74.0 in | Wt 274.2 lb

## 2018-12-27 DIAGNOSIS — Z01812 Encounter for preprocedural laboratory examination: Secondary | ICD-10-CM | POA: Diagnosis not present

## 2018-12-27 DIAGNOSIS — I428 Other cardiomyopathies: Secondary | ICD-10-CM | POA: Diagnosis not present

## 2018-12-27 LAB — BASIC METABOLIC PANEL
BUN/Creatinine Ratio: 18 (ref 10–24)
BUN: 22 mg/dL (ref 8–27)
CO2: 20 mmol/L (ref 20–29)
Calcium: 9.2 mg/dL (ref 8.6–10.2)
Chloride: 102 mmol/L (ref 96–106)
Creatinine, Ser: 1.25 mg/dL (ref 0.76–1.27)
GFR calc Af Amer: 71 mL/min/{1.73_m2} (ref >59)
GFR calc non Af Amer: 62 mL/min/{1.73_m2} (ref >59)
Glucose: 107 mg/dL — ABNORMAL HIGH (ref 65–99)
Potassium: 4.7 mmol/L (ref 3.5–5.2)
Sodium: 138 mmol/L (ref 134–144)

## 2018-12-27 NOTE — Patient Instructions (Signed)
Medication Instructions:  Your physician recommends that you continue on your current medications as directed. Please refer to the Current Medication list given to you today.     * If you need a refill on your cardiac medications before your next appointment, please call your pharmacy. *   Labwork: Pre procedure lab work today: BMET & CBC If you have labs (blood work) drawn today and your tests are completely normal, you will receive your results only by:  [employer] (if you have MyChart) OR  A paper copy in the mail If you have any lab test that is abnormal or we need to change your treatment, we will call you to review the results.   Testing/Procedures: Your physician has recommended that you have a defibrillator inserted. An implantable cardioverter defibrillator (ICD) is a small device that is placed in your chest or, in rare cases, your abdomen. This device uses electrical pulses or shocks to help control life-threatening, irregular heartbeats that could lead the heart to suddenly stop beating (sudden cardiac arrest). Leads are attached to the ICD that goes into your heart. This is done in the hospital and usually requires an overnight stay. Please follow the instructions below, located under the special instructions section.   Follow-Up: Your physician recommends that you schedule a wound check appointment 10-14 days, after your procedure on 01/13/19, with the device clinic.  Your physician recommends that you schedule a follow up appointment in 91 days, after your procedure on 01/13/19, with Dr. Curt Bears.  * Please note that any paperwork needing to be filled out by the provider will need to be addressed at the front desk prior to seeing the provider.  Please note that any FMLA, disability or other documents regarding health condition is subject to a $25.00 charge that must be received prior to completion of paperwork in the form of a money order or check. *  Thank you for  choosing [facility]!!   Trinidad Curet, RN [phone]   Any Other Special Instructions Will Be Listed Below (If Applicable).    Implantable Device Instructions  You are scheduled for: Defibrillator implant on 01/13/19 with Dr. Curt Bears.  1.   Pre procedure testing-             A.  LAB WORK--- On 12/27/18 you are scheduled to have blood work              B. COVID TEST-- On 01/10/19 @ 9:30 am - You will go to [facility] ([place]) for your Covid testing.   This is a drive thru test site.  There will be multiple testing areas.  Be sure to share with the first checkpoint that you are there for pre-procedure/surgery testing. This will put you into the right (yellow) lane that leads to the PAT testing team.   Stay in your car and the nurse team will come to your car to test you.  After you are tested please go home and self quarantine until the day of your procedure.    2. On the day of your procedure 01/13/19 you will go to [facility] [phone]) at 5:30 am.  Dennis Bast will go to the main entrance [address] [employer]) and enter where the [employer] are.  You will check in at ADMITTING.  You may have one support person come in to the hospital with you.  They will be asked to wait in the waiting room.  3.   Do not eat or drink after midnight prior to your procedure.   4.   On the morning of your procedure do NOT take any medication.  5.  The night before your procedure and the morning of your procedure scrub your neck/chest with surgical scrub.  An instruction letter is included with this letter.    5.  Plan for an overnight stay.  If you use your phone frequently bring your phone charger.  When you are discharged you will need someone to drive you home.   6.  You will follow up with the [facility] clinic 10-14 days after your procedure. You will follow up with Dr. Curt Bears 91 days after your procedure.  These appointments will be made for  you.   * If you have ANY questions after you get home, please call the office [phone] and ask for Malachai Schalk RN or send a MyChart message.    Cardioverter Defibrillator Implantation An implantable cardioverter defibrillator (ICD) is a small, lightweight, battery-powered device that is placed (implanted) under the skin in the chest or abdomen. Your caregiver may prescribe an ICD if:  You have had an irregular heart rhythm (arrhythmia) that originated in the lower chambers of the heart (ventricles).  Your heart has been damaged by a disease (such as coronary artery disease) or heart condition (such as a heart attack). An ICD consists of a battery that lasts several years, a small computer called a pulse generator, and wires called leads that go into the heart. It is used to detect and correct two dangerous arrhythmias: a rapid heart rhythm (tachycardia) and an arrhythmia in which the ventricles contract in an uncoordinated way (fibrillation). When an ICD detects tachycardia, it sends an electrical signal to the heart that restores the heartbeat to normal (cardioversion). This signal is usually painless. If cardioversion does not work or if the ICD detects fibrillation, it delivers a small electrical shock to the heart (defibrillation) to restart the heart. The shock may feel like a strong jolt in the chest. ICDs may be programmed to correct other problems. Sometimes, ICDs are programmed to act as another type of implantable device called a pacemaker. Pacemakers are used to treat a slow heartbeat (bradycardia). LET YOUR CAREGIVER KNOW ABOUT:  Any allergies you have.  All medicines you are taking, including vitamins, herbs, eyedrops, and over-the-counter medicines and creams.  Previous problems you or members of your family have had with the use of anesthetics.  Any blood disorders you have had.  Other health problems you have. RISKS AND COMPLICATIONS Generally, the procedure to implant an  ICD is safe. However, as with any surgical procedure, complications can occur. Possible complications associated with implanting an ICD include:  Swelling, bleeding, or bruising at the site where the ICD was implanted.  Infection at the site where the ICD was implanted.  A reaction to medicine used during the procedure.  Nerve, heart, or blood vessel damage.  Blood clots. BEFORE THE PROCEDURE  You may need to have blood tests, heart tests, or a chest X-ray done before the day of the procedure.  Ask your caregiver about changing or stopping your regular medicines.  Make plans to have someone drive you home. You may need to stay in the hospital overnight after the procedure.  Stop smoking at least 24 hours before the procedure.  Take a bath or shower the night before the procedure. You may need to scrub your chest or abdomen with a special  type of soap.  Do not eat or drink before your procedure for as long as directed by your caregiver. Ask if it is okay to take any needed medicine with a small sip of water. PROCEDURE  The procedure to implant an ICD in your chest or abdomen is usually done at a hospital in a room that has a large X-ray machine called a fluoroscope. The machine will be above you during the procedure. It will help your caregiver see your heart during the procedure. Implanting an ICD usually takes 1-3 hours. Before the procedure:   Small monitors will be put on your body. They will be used to check your heart, blood pressure, and oxygen level.  A needle will be put into a vein in your hand or arm. This is called an intravenous (IV) access tube. Fluids and medicine will flow directly into your body through the IV tube.  Your chest or abdomen will be cleaned with a germ-killing (antiseptic) solution. The area may be shaved.  You may be given medicine to help you relax (sedative).  You will be given a medicine called a local anesthetic. This medicine will make the  surgical site numb while the ICD is implanted. You will be sleepy but awake during the procedure. After you are numb the procedure will begin. The caregiver will:  Make a small cut (incision). This will make a pocket deep under your skin that will hold the pulse generator.  Guide the leads through a large blood vessel into your heart and attach them to the heart muscles. Depending on the ICD, the leads may go into one ventricle or they may go to both ventricles and into an upper chamber of the heart (atrium).  Test the ICD.  Close the incision with stitches, glue, or staples. AFTER THE PROCEDURE  You may feel pain. Some pain is normal. It may last a few days.  You may stay in a recovery area until the local anesthetic has worn off. Your blood pressure and pulse will be checked often. You will be taken to a room where your heart will be monitored.  A chest X-ray will be taken. This is done to check that the cardioverter defibrillator is in the right place.  You may stay in the hospital overnight.  A slight bump may be seen over the skin where the ICD was placed. Sometimes, it is possible to feel the ICD under the skin. This is normal.  In the months and years afterward, your caregiver will check the device, the leads, and the battery every few months. Eventually, when the battery is low, the ICD will be replaced.   This information is not intended to replace advice given to you by your health care provider. Make sure you discuss any questions you have with your health care provider.   Document Released: 10/12/2001 Document Revised: 11/10/2012 Document Reviewed: 02/09/2012 Elsevier Interactive Patient Education 2016 [place] Defibrillator Implantation, Care After This sheet gives you information about how to care for yourself after your procedure. Your health care provider may also give you more specific instructions. If you have problems or questions, contact your  health care provider. What can I expect after the procedure? After the procedure, it is common to have:  Some pain. It may last a few days.  A slight bump over the skin where the device was placed. Sometimes, it is possible to feel the device under the skin. This is normal.  During  the months and years after your procedure, your health care provider will check the device, the leads, and the battery every few months. Eventually, when the battery is low, the device will be replaced. Follow these instructions at home: Medicines  Take over-the-counter and prescription medicines only as told by your health care provider.  If you were prescribed an antibiotic medicine, take it as told by your health care provider. Do not stop taking the antibiotic even if you start to feel better. Incision care   Follow instructions from your health care provider about how to take care of your incision area. Make sure you: ? Wash your hands with soap and water before you change your bandage (dressing). If soap and water are not available, use hand sanitizer. ? Change your dressing as told by your health care provider. ? Leave stitches (sutures), skin glue, or adhesive strips in place. These skin closures may need to stay in place for 2 weeks or longer. If adhesive strip edges start to loosen and curl up, you may trim the loose edges. Do not remove adhesive strips completely unless your health care provider tells you to do that.  Check your incision area every day for signs of infection. Check for: ? More redness, swelling, or pain. ? More fluid or blood. ? Warmth. ? Pus or a bad smell.  Do not use lotions or ointments near the incision area unless told by your health care provider.  Keep the incision area clean and dry for 2-3 days after the procedure or for as long as told by your health care provider. It takes several weeks for the incision site to heal completely.  Do not take baths, swim, or use a hot  tub until your health care provider approves. Activity  Try to walk a little every day. Exercising is important after this procedure. Also, use your shoulder on the side of the defibrillator in daily tasks that do not require a lot of motion.  For at least 6 weeks: ? Do not lift your upper arm above your shoulders. This means no tennis, golf, or swimming for this period of time. If you tend to sleep with your arm above your head, use a restraint to prevent this during sleep. ? Avoid sudden jerking, pulling, or chopping movements that pull your upper arm far away from your body.  Ask your health care provider when you may go back to work.  Check with your health care provider before you start to drive or play sports. Electric and magnetic fields  Tell all health care providers that you have a defibrillator. This may prevent them from giving you an MRI scan because strong magnets are used for that test.  If you must pass through a metal detector, quickly walk through it. Do not stop under the detector, and do not stand near it.  Avoid places or objects that have a strong electric or magnetic field, including: ? [employer]. At the airport, let officials know that you have a defibrillator. Your defibrillator ID card will let you be checked in a way that is safe for you and will not damage your defibrillator. Also, do not let a security person wave a magnetic wand near your defibrillator. That can make it stop working. ? Power plants. ? Large electrical generators. ? Anti-theft systems or electronic article surveillance (EAS). ? Radiofrequency transmission towers, such as cell phone and radio towers.  Do not use amateur (ham) radio equipment or electric (arc) welding  torches. Some devices are safe to use if held at least 12 inches (30 cm) from your defibrillator. These include power tools, lawn mowers, and speakers. If you are unsure if something is safe to use, ask your health care  provider.  Do not use MP3 player headphones. They have magnets.  You may safely use electric blankets, heating pads, computers, and microwave ovens.  When using your cell phone, hold it to the ear that is on the opposite side from the defibrillator. Do not leave your cell phone in a pocket over the defibrillator. General instructions  Follow diet instructions from your health care provider, if this applies.  Always keep your defibrillator ID card with you. The card should list the implant date, device model, and manufacturer. Consider wearing a medical alert bracelet or necklace.  Have your defibrillator checked every 3-6 months or as often as told by your health care provider. Most defibrillators last for 4-8 years.  Keep all follow-up visits as told by your health care provider. This is important for your health care provider to make sure your chest is healing the way it should. Ask your health care provider when you should come back to have your stitches or staples taken out. Contact a health care provider if:  You feel one shock in your chest.  You gain weight suddenly.  Your legs or feet swell more than they have before.  It feels like your heart is fluttering or skipping beats (heart palpitations).  You have more redness, swelling, or pain around your incision.  You have more fluid or blood coming from your incision.  Your incision feels warm to the touch.  You have pus or a bad smell coming from your incision.  You have a fever. Get help right away if:  You have chest pain.  You feel more than one shock.  You feel more short of breath than you have felt before.  You feel more light-headed than you have felt before.  Your incision starts to open up. This information is not intended to replace advice given to you by your health care provider. Make sure you discuss any questions you have with your health care provider. Document Released: 08/09/2004 Document Revised:  08/10/2015 Document Reviewed: 06/27/2015 Elsevier Interactive Patient Education  2018 [place] Discharge Instructions for  Pacemaker/Defibrillator Patients  ACTIVITY No heavy lifting or vigorous activity with your left/right arm for 6 to 8 weeks.  Do not raise your left/right arm above your head for one week.  Gradually raise your affected arm as drawn below.           __  NO DRIVING for     ; you may begin driving on     .  WOUND CARE - Keep the wound area clean and dry.  Do not get this area wet for one week. No showers for one week; you may shower on     . - The tape/steri-strips on your wound will fall off; do not pull them off.  No bandage is needed on the site.  DO  NOT apply any creams, oils, or ointments to the wound area. - If you notice any drainage or discharge from the wound, any swelling or bruising at the site, or you develop a fever > 101? F after you are discharged home, call the office at once.  SPECIAL INSTRUCTIONS - You are still able to use cellular telephones; use the ear opposite the side  where you have your pacemaker/defibrillator.  Avoid carrying your cellular phone near your device. - When traveling through airports, show security personnel your identification card to avoid being screened in the metal detectors.  Ask the security personnel to use the hand wand. - Avoid arc welding equipment, MRI testing (magnetic resonance imaging), TENS units (transcutaneous nerve stimulators).  Call the office for questions about other devices. - Avoid electrical appliances that are in poor condition or are not properly grounded. - Microwave ovens are safe to be near or to operate.  ADDITIONAL INFORMATION FOR DEFIBRILLATOR PATIENTS SHOULD YOUR DEVICE GO OFF: - If your device goes off ONCE and you feel fine afterward, notify the device clinic nurses. - If your device goes off ONCE and you do not feel well afterward, call 911. - If your device goes off  TWICE, call 911. - If your device goes off [place], call 911.  DO NOT DRIVE YOURSELF OR A FAMILY MEMBER WITH A DEFIBRILLATOR TO THE HOSPITAL-CALL 911.

## 2018-12-27 NOTE — Progress Notes (Signed)
Electrophysiology Office Note   Date:  12/27/2018   ID:  Raymond Alvarez, DOB 07/14/1955, MRN [id number]  PCP:  Raina Mina., MD  Cardiologist: Agustin Cree Primary Electrophysiologist:  Rykker Coviello Meredith Leeds, MD    Chief Complaint: CHF   History of Present Illness: Raymond Alvarez is a 63 y.o. male who is being seen today for the evaluation of CHF at the request of [facility]. Presenting today for electrophysiology evaluation.  He has a history of CHF, CKD, hypertension.    Today, he denies symptoms of palpitations, chest pain, shortness of breath, orthopnea, PND, lower extremity edema, claudication, dizziness, presyncope, syncope, bleeding, or neurologic sequela. The patient is tolerating medications without difficulties.  He is currently feeling well.  He does have some fatigue since starting his carvedilol with an increased dose.  Otherwise, he does say that he felt better after starting his Entresto.  He currently has no chest pain or shortness of breath.  He is able to do all of his daily activities.  Repeat echo shows a persistently low ejection fraction.   Past Medical History:  Diagnosis Date  . Anemia   . Anxiety   . Asthma   . Chronic kidney disease   . Colon polyp   . Depression   . Diverticulitis   . GERD (gastroesophageal reflux disease)   . Gout   . Hypertension   . Prostate cancer ([place])   . Seasonal allergies   . Wears glasses    Past Surgical History:  Procedure Laterality Date  . APPENDECTOMY  1970  . COLONOSCOPY  09/24/2016   Colonic polyp status post polypectomy. Tubular adenoma.   . ESOPHAGOGASTRODUODENOSCOPY  12/10/2016   Small hiatal hernia. Mild gastritis. Gastric polyps status post polypectomy x 4   . MASS EXCISION Left 12/15/2013   Procedure: EXCISION MUCOID TUMOR LEFT INDEX DEBRIDEMENT DISTAL INTERPHALANGEAL JOINT LEFT INDEX FINGER;  Surgeon: Daryll Brod, MD;  Location: [facility];  Service: Orthopedics;  Laterality: Left;  .  PROSTATECTOMY  2015  . TONSILLECTOMY       Current Outpatient Medications  Medication Sig Dispense Refill  . albuterol (PROVENTIL HFA;VENTOLIN HFA) 108 (90 Base) MCG/ACT inhaler Inhale 2 puffs into the lungs every 4 (four) hours as needed for wheezing or shortness of breath.    . allopurinol (ZYLOPRIM) 300 MG tablet Take 300 mg by mouth daily.    . carvedilol (COREG) 12.5 MG tablet Take 1 tablet (12.5 mg total) by mouth 2 (two) times daily. 60 tablet 1  . cyanocobalamin (,VITAMIN B-12,) 1000 MCG/ML injection Inject 1,000 mcg into the muscle every 30 (thirty) days.    Marland Kitchen dicyclomine (BENTYL) 10 MG capsule Take 1 capsule (10 mg total) by mouth 4 (four) times daily -  before meals and at bedtime. 120 capsule 11  . ferrous sulfate 325 (65 FE) MG EC tablet Take 325 mg by mouth 3 (three) times a week.    . furosemide (LASIX) 20 MG tablet Take 1 tablet (20 mg total) by mouth daily. 90 tablet 1  . guaiFENesin (MUCINEX) 600 MG 12 hr tablet Take 2 tablets (1,200 mg total) by mouth 2 (two) times daily. 20 tablet 0  . hyoscyamine (LEVSIN SL) 0.125 MG SL tablet Place 1 tablet (0.125 mg total) under the tongue every 4 (four) hours as needed. (Patient taking differently: Place 0.125 mg under the tongue every 4 (four) hours as needed for cramping. ) 120 tablet 8  . LORazepam (ATIVAN) 1 MG tablet Take 1 mg  by mouth daily as needed for anxiety.    . methocarbamol (ROBAXIN) 750 MG tablet Take 750 mg by mouth 4 (four) times daily.    . Olopatadine HCl (PAZEO) 0.7 % SOLN Place 1 drop into both eyes daily.    Marland Kitchen omeprazole (PRILOSEC) 20 MG capsule Take 20 mg by mouth daily.    . sacubitril-valsartan (ENTRESTO) 49-51 MG Take 1 tablet by mouth 2 (two) times daily. 60 tablet 1  . spironolactone (ALDACTONE) 25 MG tablet Take 1 tablet by mouth daily.    . vitamin C (VITAMIN C) 500 MG tablet Take 1 tablet (500 mg total) by mouth daily. 30 tablet 0  . vortioxetine HBr (TRINTELLIX) 20 MG TABS tablet Take 20 mg by mouth  daily.     Marland Kitchen zinc sulfate 220 (50 Zn) MG capsule Take 1 capsule (220 mg total) by mouth daily. 30 capsule 0   No current facility-administered medications for this visit.     Allergies:   Other and Sulfa antibiotics   Social History:  The patient  reports that he has never smoked. He has never used smokeless tobacco. He reports current alcohol use. He reports that he does not use drugs.   Family History:  The patient's family history includes Breast cancer in his mother; Prostate cancer in his father and maternal grandfather.    ROS:  Please see the history of present illness.   Otherwise, review of systems is positive for none.   All other systems are reviewed and negative.    PHYSICAL EXAM: VS:  BP 94/60   Pulse 79   Ht 6\' 2"  (1.88 m)   Wt 274 lb 3.2 oz (124.4 kg)   SpO2 95%   BMI 35.21 kg/m  , BMI Body mass index is 35.21 kg/m. GEN: Well nourished, well developed, in no acute distress  HEENT: normal  Neck: no JVD, carotid bruits, or masses Cardiac: RRR; no murmurs, rubs, or gallops,no edema  Respiratory:  clear to auscultation bilaterally, normal work of breathing GI: soft, nontender, nondistended, + BS MS: no deformity or atrophy  Skin: warm and dry Neuro:  Strength and sensation are intact Psych: euthymic mood, full affect  EKG:  EKG is not ordered today. Personal review of the ekg ordered 11/01/18 shows sinus rhythm, rate 77, left bundle branch block  Recent Labs: 05/12/2018: TSH 0.939 05/17/2018: ALT 72; Hemoglobin 14.5; Magnesium 2.2; Platelets 367 10/15/2018: BUN 22; Creatinine, Ser 1.12; Potassium 4.5; Sodium 136    Lipid Panel  No results found for: CHOL, TRIG, HDL, CHOLHDL, VLDL, LDLCALC, LDLDIRECT   Wt Readings from Last 3 Encounters:  12/27/18 274 lb 3.2 oz (124.4 kg)  11/01/18 274 lb 9.6 oz (124.6 kg)  09/27/18 289 lb (131.1 kg)      Other studies Reviewed: Additional studies/ records that were reviewed today include: TTE 12/03/18  Review of the  above records today demonstrates:    1. Left ventricular ejection fraction, by visual estimation, is 30 to 35%. The left ventricle has normal function. Left ventricular septal wall thickness was normal. Normal left ventricular posterior wall thickness. There is no left ventricular  hypertrophy.  2. LAD distribution, entire septum, and apical lateral segment are abnormal.  3. Abnormal septal motion.  4. Left ventricular diastolic parameters are consistent with Grade I diastolic dysfunction (impaired relaxation).  5. Mild to moderately dilated left ventricular internal cavity size.  6. Global right ventricle has mildly reduced systolic function.The right ventricular size is normal. No increase in right  ventricular wall thickness.  7. Left atrial size was mildly dilated.  8. Right atrial size was normal.  9. The mitral valve is normal in structure. Mild mitral valve regurgitation. No evidence of mitral stenosis. 10. Mild to moderate tricuspid stenosis. 11. The tricuspid valve is normal in structure. Tricuspid valve regurgitation is mild. 12. The aortic valve is normal in structure. Aortic valve regurgitation is not visualized. No evidence of aortic valve sclerosis or stenosis. 13. The pulmonic valve was normal in structure. Pulmonic valve regurgitation is not visualized. 14. Severely elevated pulmonary artery systolic pressure. 15. The inferior vena cava is normal in size with greater than 50% respiratory variability, suggesting right atrial pressure of 3 mmHg.   ASSESSMENT AND PLAN:  1.  Chronic systolic heart failure due to nonischemic cardiomyopathy: Repeat echo shows a persistently low ejection fraction.  Due to that, he would benefit from an ICD.  He is currently on optimal medical therapy with carvedilol, Entresto, Aldactone.  We Dajuana Palen plan for CRT-D implant.  Risks and benefits were discussed include bleeding, tamponade, infection, pneumothorax.  The patient understands these risks and is  agreed to the procedure.  2.  Hypertension: Currently well controlled  Case discussed with referring cardiology  Current medicines are reviewed at length with the patient today.   The patient does not have concerns regarding his medicines.  The following changes were made today:  none  Labs/ tests ordered today include:  Orders Placed This Encounter  Procedures  . Basic metabolic panel  . CBC     Disposition:   FU with Clance Baquero 3 months  Signed, Tyheim Vanalstyne Meredith Leeds, MD  12/27/2018 9:58 AM     [facility] [address] [place] [address] [phone] (office) [phone] (fax)

## 2018-12-28 LAB — CBC
Hematocrit: 40.2 % (ref 37.5–51.0)
Hemoglobin: 13.7 g/dL (ref 13.0–17.7)
MCH: 30.9 pg (ref 26.6–33.0)
MCHC: 34.1 g/dL (ref 31.5–35.7)
MCV: 91 fL (ref 79–97)
Platelets: 277 10*3/uL (ref 150–450)
RBC: 4.44 x10E6/uL (ref 4.14–5.80)
RDW: 13.7 % (ref 11.6–15.4)
WBC: 9.4 10*3/uL (ref 3.4–10.8)

## 2018-12-30 ENCOUNTER — Other Ambulatory Visit: Payer: Self-pay | Admitting: Cardiology

## 2018-12-30 ENCOUNTER — Other Ambulatory Visit: Payer: Self-pay | Admitting: Gastroenterology

## 2018-12-30 DIAGNOSIS — R1013 Epigastric pain: Secondary | ICD-10-CM

## 2019-01-03 NOTE — Telephone Encounter (Signed)
Carvedilol and lasix refills sent to [facility] in [place].

## 2019-01-10 ENCOUNTER — Other Ambulatory Visit (HOSPITAL_COMMUNITY)
Admission: RE | Admit: 2019-01-10 | Discharge: 2019-01-10 | Disposition: A | Discharge status: Home / Self Care | Payer: BC Managed Care – PPO | Source: Ambulatory Visit | Attending: Cardiology | Admitting: Cardiology

## 2019-01-10 DIAGNOSIS — Z20828 Contact with and (suspected) exposure to other viral communicable diseases: Secondary | ICD-10-CM | POA: Insufficient documentation

## 2019-01-10 DIAGNOSIS — Z01812 Encounter for preprocedural laboratory examination: Secondary | ICD-10-CM | POA: Insufficient documentation

## 2019-01-11 ENCOUNTER — Telehealth: Payer: Self-pay | Admitting: Gastroenterology

## 2019-01-11 DIAGNOSIS — R1013 Epigastric pain: Secondary | ICD-10-CM

## 2019-01-11 LAB — NOVEL CORONAVIRUS, NAA (HOSP ORDER, SEND-OUT TO REF LAB; TAT 18-24 HRS): SARS-CoV-2, NAA: NOT DETECTED

## 2019-01-11 NOTE — Telephone Encounter (Signed)
Called and spoke with patient-patient is requesting a refill on Bentyl as he is scheduled for open heart surgery on Thursday and cannot come to office for appt prior to refill;   Patient has been scheduled for a f/u OV on 02/14/2019 at 10:00 am; Patient advised to call back to the office at [phone] should questions/concerns arise;  Patient verbalized understanding of information/instructions;

## 2019-01-11 NOTE — Telephone Encounter (Signed)
Continue bentyl 10mg  po qid prn #120 with 11 refills Please wish him all the best and speedy recovery from [place] all  Thx  RG

## 2019-01-12 MED ORDER — DICYCLOMINE HCL 10 MG PO CAPS: 10.0000 mg | ORAL_CAPSULE | Freq: Three times a day (TID) | ORAL | 11 refills | Status: AC

## 2019-01-12 MED ORDER — DEXTROSE 5 % IV SOLN
3.0000 g | INTRAVENOUS | Status: AC
  Administered 2019-01-13: 3 g via INTRAVENOUS
  Filled 2019-01-12: qty 3

## 2019-01-12 NOTE — Telephone Encounter (Signed)
Called and spoke with patient-patient advised of refill being sent to pharmacy-patient verified pharmacy of choice; Patient advised to call back to the office at [phone] should questions/concerns arise;  Patient verbalized understanding of information/instructions;

## 2019-01-13 ENCOUNTER — Ambulatory Visit (HOSPITAL_COMMUNITY): Payer: BC Managed Care – PPO

## 2019-01-13 ENCOUNTER — Encounter (HOSPITAL_COMMUNITY)
Admission: RE | Disposition: A | Discharge status: Home / Self Care | Payer: Self-pay | Source: Home / Self Care | Attending: Cardiology

## 2019-01-13 ENCOUNTER — Other Ambulatory Visit: Payer: Self-pay

## 2019-01-13 ENCOUNTER — Ambulatory Visit (HOSPITAL_COMMUNITY)
Admission: RE | Admit: 2019-01-13 | Discharge: 2019-01-13 | Disposition: A | Discharge status: Home / Self Care | Payer: BC Managed Care – PPO | Attending: Cardiology | Admitting: Cardiology

## 2019-01-13 ENCOUNTER — Other Ambulatory Visit: Payer: Self-pay | Admitting: Cardiology

## 2019-01-13 DIAGNOSIS — J45909 Unspecified asthma, uncomplicated: Secondary | ICD-10-CM | POA: Insufficient documentation

## 2019-01-13 DIAGNOSIS — I428 Other cardiomyopathies: Secondary | ICD-10-CM

## 2019-01-13 DIAGNOSIS — M109 Gout, unspecified: Secondary | ICD-10-CM | POA: Insufficient documentation

## 2019-01-13 DIAGNOSIS — Z95818 Presence of other cardiac implants and grafts: Secondary | ICD-10-CM

## 2019-01-13 DIAGNOSIS — K219 Gastro-esophageal reflux disease without esophagitis: Secondary | ICD-10-CM | POA: Insufficient documentation

## 2019-01-13 DIAGNOSIS — Z882 Allergy status to sulfonamides status: Secondary | ICD-10-CM | POA: Diagnosis not present

## 2019-01-13 DIAGNOSIS — Z006 Encounter for examination for normal comparison and control in clinical research program: Secondary | ICD-10-CM | POA: Diagnosis not present

## 2019-01-13 DIAGNOSIS — N189 Chronic kidney disease, unspecified: Secondary | ICD-10-CM | POA: Diagnosis not present

## 2019-01-13 DIAGNOSIS — Z9079 Acquired absence of other genital organ(s): Secondary | ICD-10-CM | POA: Insufficient documentation

## 2019-01-13 DIAGNOSIS — Z79899 Other long term (current) drug therapy: Secondary | ICD-10-CM | POA: Diagnosis not present

## 2019-01-13 DIAGNOSIS — I5022 Chronic systolic (congestive) heart failure: Secondary | ICD-10-CM

## 2019-01-13 DIAGNOSIS — D649 Anemia, unspecified: Secondary | ICD-10-CM | POA: Diagnosis not present

## 2019-01-13 DIAGNOSIS — I429 Cardiomyopathy, unspecified: Secondary | ICD-10-CM

## 2019-01-13 DIAGNOSIS — I130 Hypertensive heart and chronic kidney disease with heart failure and stage 1 through stage 4 chronic kidney disease, or unspecified chronic kidney disease: Secondary | ICD-10-CM | POA: Diagnosis not present

## 2019-01-13 DIAGNOSIS — I447 Left bundle-branch block, unspecified: Secondary | ICD-10-CM | POA: Diagnosis not present

## 2019-01-13 DIAGNOSIS — Z959 Presence of cardiac and vascular implant and graft, unspecified: Secondary | ICD-10-CM

## 2019-01-13 HISTORY — PX: BIV ICD INSERTION CRT-D: EP1195

## 2019-01-13 SURGERY — BIV ICD INSERTION CRT-D
Anesthesia: LOCAL

## 2019-01-13 MED ORDER — SODIUM CHLORIDE 0.9 % IV SOLN
INTRAVENOUS | Status: AC
  Filled 2019-01-13: qty 2

## 2019-01-13 MED ORDER — MIDAZOLAM HCL 5 MG/5ML IJ SOLN
INTRAMUSCULAR | Status: DC | PRN
  Administered 2019-01-13 (×8): 1 mg via INTRAVENOUS

## 2019-01-13 MED ORDER — IOHEXOL 350 MG/ML SOLN
INTRAVENOUS | Status: DC | PRN
  Administered 2019-01-13 (×2): 15 mL

## 2019-01-13 MED ORDER — SODIUM CHLORIDE 0.9 % IV SOLN
80.0000 mg | INTRAVENOUS | Status: AC
  Administered 2019-01-13: 80 mg
  Filled 2019-01-13: qty 2

## 2019-01-13 MED ORDER — CEFAZOLIN SODIUM-DEXTROSE 1-4 GM/50ML-% IV SOLN: 1.0000 g | Freq: Four times a day (QID) | INTRAVENOUS | Status: DC

## 2019-01-13 MED ORDER — FENTANYL CITRATE (PF) 100 MCG/2ML IJ SOLN
INTRAMUSCULAR | Status: AC
  Filled 2019-01-13: qty 2

## 2019-01-13 MED ORDER — CEFAZOLIN SODIUM-DEXTROSE 2-4 GM/100ML-% IV SOLN
INTRAVENOUS | Status: AC
  Filled 2019-01-13: qty 100

## 2019-01-13 MED ORDER — ONDANSETRON HCL 4 MG/2ML IJ SOLN: 4.0000 mg | Freq: Four times a day (QID) | INTRAMUSCULAR | Status: DC | PRN

## 2019-01-13 MED ORDER — HEPARIN (PORCINE) IN NACL 1000-0.9 UT/500ML-% IV SOLN
INTRAVENOUS | Status: AC
  Filled 2019-01-13: qty 500

## 2019-01-13 MED ORDER — CHLORHEXIDINE GLUCONATE 4 % EX LIQD: 4.0000 "application " | Freq: Once | CUTANEOUS | Status: DC

## 2019-01-13 MED ORDER — ACETAMINOPHEN 325 MG PO TABS
ORAL_TABLET | ORAL | Status: AC
  Administered 2019-01-13: 650 mg via ORAL
  Filled 2019-01-13: qty 2

## 2019-01-13 MED ORDER — LIDOCAINE HCL 1 % IJ SOLN
INTRAMUSCULAR | Status: AC
  Filled 2019-01-13: qty 60

## 2019-01-13 MED ORDER — ACETAMINOPHEN 325 MG PO TABS: 325.0000 mg | ORAL_TABLET | ORAL | Status: DC | PRN

## 2019-01-13 MED ORDER — FENTANYL CITRATE (PF) 100 MCG/2ML IJ SOLN
INTRAMUSCULAR | Status: DC | PRN
  Administered 2019-01-13 (×8): 25 ug via INTRAVENOUS

## 2019-01-13 MED ORDER — MIDAZOLAM HCL 5 MG/5ML IJ SOLN
INTRAMUSCULAR | Status: AC
  Filled 2019-01-13: qty 5

## 2019-01-13 MED ORDER — LIDOCAINE HCL (PF) 1 % IJ SOLN
INTRAMUSCULAR | Status: DC | PRN
  Administered 2019-01-13: 50 mL

## 2019-01-13 MED ORDER — SODIUM CHLORIDE 0.9 % IV SOLN
INTRAVENOUS | Status: DC
  Administered 2019-01-13: 07:00:00 via INTRAVENOUS

## 2019-01-13 MED ORDER — HEPARIN (PORCINE) IN NACL 2-0.9 UNITS/ML
INTRAMUSCULAR | Status: AC | PRN
  Administered 2019-01-13: 500 mL

## 2019-01-13 SURGICAL SUPPLY — 20 items
BALLN COR SINUS VENO 6FR 80 (BALLOONS) ×2
BALLOON COR SINUS VENO 6FR 80 (BALLOONS) ×1 IMPLANT
CABLE SURGICAL S-101-97-12 (CABLE) ×2 IMPLANT
CATH CPS DIRECT 135 DS2C020 (CATHETERS) ×2 IMPLANT
CATH CPS QUART CN DS2N029-65 (CATHETERS) ×2 IMPLANT
CATH CPS QUART SUB DS2N027-59 (CATHETERS) ×2 IMPLANT
CPS IMPLANT KIT 410190 (MISCELLANEOUS) ×2 IMPLANT
ICD GALLANT HFCRTD CDHFA500Q (ICD Generator) ×2 IMPLANT
KIT ESSENTIALS PG (KITS) ×2 IMPLANT
LEAD DURATA 7122Q-65CM (Lead) ×2 IMPLANT
LEAD QUARTET 1456Q-86 (Lead) ×1 IMPLANT
LEAD TENDRIL MRI 52CM LPA1200M (Lead) ×2 IMPLANT
PAD PRO RADIOLUCENT 2001M-C (PAD) ×2 IMPLANT
QUARTET 1456Q-86 (Lead) ×2 IMPLANT
SHEATH 8FR PRELUDE SNAP 13 (SHEATH) ×4 IMPLANT
TRAY PACEMAKER INSERTION (PACKS) ×2 IMPLANT
WIRE ACUITY WHISPER EDS 4648 (WIRE) ×2 IMPLANT
WIRE ASAHI SION 190X3X12 .014 (WIRE) ×2 IMPLANT
WIRE HI TORQ VERSACORE-J 145CM (WIRE) ×2 IMPLANT
WIRE MAILMAN 182CM (WIRE) ×4 IMPLANT

## 2019-01-13 NOTE — H&P (Signed)
ICD Criteria  Current LVEF:30-35%. Within 12 months prior to implant: Yes   Heart failure history: Yes, Class II  Cardiomyopathy history: Yes, Non-Ischemic Cardiomyopathy.  Atrial Fibrillation/Atrial Flutter: No.  Ventricular tachycardia history: No.  Cardiac arrest history: No.  History of syndromes with risk of sudden death: No.  Previous ICD: No.  Current ICD indication: Primary  PPM indication: No.  Class I or II Bradycardia indication present: No  Beta Blocker therapy for 3 or more months: Yes, prescribed.   Ace Inhibitor/ARB therapy for 3 or more months: Yes, prescribed.    I have seen Raymond Alvarez is a 63 y.o. malepre-procedural and has been referred by Agustin Cree for consideration of ICD implant for primary prevention of sudden death.  The patient's chart has been reviewed and they meet criteria for ICD implant.  I have had a thorough discussion with the patient reviewing options.  The patient and their family (if available) have had opportunities to ask questions and have them answered. The patient and I have decided together through the [employer] to implant ICD at this time.  Risks, benefits, alternatives to ICD implantation were discussed in detail with the patient today. The patient  understands that the risks include but are not limited to bleeding, infection, pneumothorax, perforation, tamponade, vascular damage, renal failure, MI, stroke, death, inappropriate shocks, and lead dislodgement and  wishes to proceed.

## 2019-01-13 NOTE — Progress Notes (Signed)
Ambulated to bathroom tol well

## 2019-01-13 NOTE — Progress Notes (Signed)
Per Dr Curt Bears okay to d/c home if chest xray shows no pneumothorax and cxr without pneumothorax

## 2019-01-13 NOTE — Discharge Instructions (Signed)
Tomorrow, 01/14/2019, PLEASE SEND A REMOTE DEVICE TRANSMISSION         Supplemental Discharge Instructions for  Pacemaker/Defibrillator Patients  Activity No heavy lifting or vigorous activity with your left/right arm for 6 to 8 weeks.  Do not raise your left/right arm above your head for one week.  Gradually raise your affected arm as drawn below.             01/17/2019               01/18/2019                01/19/2019           01/20/2019 __  NO DRIVING for  1 week   ; you may begin driving on  [id number]   .  WOUND CARE - Keep the wound area clean and dry.  Do not get this area wet, no showers until cleared to at your wound check visit. - Remove your arm sling tomorrow, 01/14/2019 - Remove the outer plastic bandage tomorrow, 01/14/2019, the steri strips (paper tapes) underneath STAY IN PLACE - The tape/steri-strips on your wound will fall off; do not pull them off.  No bandage is needed on the site.  DO  NOT apply any creams, oils, or ointments to the wound area. - If you notice any drainage or discharge from the wound, any swelling or bruising at the site, or you develop a fever > 101? F after you are discharged home, call the office at once.  Special Instructions - You are still able to use cellular telephones; use the ear opposite the side where you have your pacemaker/defibrillator.  Avoid carrying your cellular phone near your device. - When traveling through airports, show security personnel your identification card to avoid being screened in the metal detectors.  Ask the security personnel to use the hand wand. - Avoid arc welding equipment, MRI testing (magnetic resonance imaging), TENS units (transcutaneous nerve stimulators).  Call the office for questions about other devices. - Avoid electrical appliances that are in poor condition or are not properly grounded. - Microwave ovens are safe to be near or to operate.  Additional information for defibrillator patients  should your device go off: - If your device goes off ONCE and you feel fine afterward, notify the device clinic nurses. - If your device goes off ONCE and you do not feel well afterward, call 911. - If your device goes off TWICE, call 911. - If your device goes off THREE times in one day, call 911.  DO NOT DRIVE YOURSELF OR A FAMILY MEMBER WITH A DEFIBRILLATOR TO THE HOSPITAL--CALL 911.   Cardioverter Defibrillator Implantation, Care After This sheet gives you information about how to care for yourself after your procedure. Your health care provider may also give you more specific instructions. If you have problems or questions, contact your health care provider. What can I expect after the procedure? After the procedure, it is common to have:  Some pain. It may last a few days.  A slight bump over the skin where the device was placed. Sometimes, it is possible to feel the device under the skin. This is normal.  During the months and years after your procedure, your health care provider will check the device, the leads, and the battery every few months. Eventually, when the battery is low, the device will be replaced.  You should receive your defibrillator ID card for your new device in the next 4-8  weeks.  Follow these instructions at home: Medicines  Take over-the-counter and prescription medicines only as told by your health care provider.  If you were prescribed an antibiotic medicine, take it as told by your health care provider. Do not stop taking the antibiotic even if you start to feel better. Incision care        Follow instructions from your health care provider about how to take care of your incision area. Make sure you: ? Leave stitches (sutures), skin glue, or adhesive strips in place. These skin closures may need to stay in place for 2 weeks or longer. If adhesive strip edges start to loosen and curl up, you may trim the loose edges. Do not remove adhesive strips  completely unless your health care provider tells you to do that.  Check your incision area every day for signs of infection. Check for: ? More redness, swelling, or pain. ? More fluid or blood. ? Warmth. ? Pus or a bad smell.  Do not use lotions or ointments near the incision area unless told by your health care provider.  Keep the incision area clean and dry for 7 days after the procedure or for as long as told by your health care provider. It takes several weeks for the incision site to heal completely.  Do not take baths, swim, or use a hot tub until your health care provider approves. Activity  Try to walk a little every day. Exercising is important after this procedure. Also, use your shoulder on the side of the defibrillator in daily tasks that do not require a lot of motion.  For at least 1 week: ? Do not lift your upper arm above your shoulders. This means no tennis, golf, or swimming for this period of time. If you tend to sleep with your arm above your head, use a restraint to prevent this during sleep.  For at least 6 weeks: ? Avoid sudden jerking, pulling, or chopping movements that pull your upper arm far away from your body.  Ask your health care provider when you may go back to work.  Check with your health care provider before you start to drive or play sports. Electric and magnetic fields  Tell all health care providers that you have a defibrillator. This may prevent them from giving you an MRI scan because strong magnets are used for that test.  If you must pass through a metal detector, quickly walk through it. Do not stop under the detector, and do not stand near it.  Avoid places or objects that have a strong electric or magnetic field, including: ? Airport Herbalist. At the airport, let officials know that you have a defibrillator. Your defibrillator ID card will let you be checked in a way that is safe for you and will not damage your defibrillator. Also,  do not let a security person wave a magnetic wand near your defibrillator. That can make it stop working. ? Power plants. ? Large electrical generators. ? Anti-theft systems or electronic article surveillance (EAS). ? Radiofrequency transmission towers, such as cell phone and radio towers.  Do not use amateur (ham) radio equipment or electric (arc) welding torches. Some devices are safe to use if held at least 12 inches (30 cm) from your defibrillator. These include power tools, lawn mowers, and speakers. If you are unsure if something is safe to use, ask your health care provider.  Do not use MP3 player headphones. They have magnets.  You may safely  use electric blankets, heating pads, computers, and microwave ovens.  When using your cell phone, hold it to the ear that is on the opposite side from the defibrillator. Do not leave your cell phone in a pocket over the defibrillator. General instructions  Follow diet instructions from your health care provider, if this applies.  Always keep your defibrillator ID card with you. The card should list the implant date, device model, and manufacturer. Consider wearing a medical alert bracelet or necklace.  Have your defibrillator checked every 3-6 months or as often as told by your health care provider. Most defibrillators last for 4-8 years.  Keep all follow-up visits as told by your health care provider. This is important for your health care provider to make sure your chest is healing the way it should. Ask your health care provider when you should come back to have your stitches or staples taken out. Contact a health care provider if:  You gain weight suddenly.  Your legs or feet swell more than they have before.  It feels like your heart is fluttering or skipping beats (heart palpitations).  You have more redness, swelling, or pain around your incision.  You have more fluid or blood coming from your incision.  Your incision feels warm  to the touch.  You have pus or a bad smell coming from your incision.  You have a fever. Get help right away if:  You have chest pain.  You feel more than one shock.  You feel more short of breath than you have felt before.  You feel more light-headed than you have felt before.  Your incision starts to open up. This information is not intended to replace advice given to you by your health care provider. Make sure you discuss any questions you have with your health care provider.

## 2019-01-14 MED FILL — Lidocaine HCl Local Inj 1%: INTRAMUSCULAR | Qty: 60 | Status: AC

## 2019-01-20 ENCOUNTER — Encounter: Payer: Self-pay | Admitting: Cardiology

## 2019-01-20 ENCOUNTER — Ambulatory Visit (INDEPENDENT_AMBULATORY_CARE_PROVIDER_SITE_OTHER): Payer: BC Managed Care – PPO | Admitting: Cardiology

## 2019-01-20 ENCOUNTER — Other Ambulatory Visit: Payer: Self-pay

## 2019-01-20 VITALS — BP 100/60 | HR 73 | Ht 74.0 in | Wt 273.0 lb

## 2019-01-20 DIAGNOSIS — I10 Essential (primary) hypertension: Secondary | ICD-10-CM

## 2019-01-20 DIAGNOSIS — I420 Dilated cardiomyopathy: Secondary | ICD-10-CM

## 2019-01-20 DIAGNOSIS — I447 Left bundle-branch block, unspecified: Secondary | ICD-10-CM | POA: Diagnosis not present

## 2019-01-20 NOTE — Patient Instructions (Signed)

## 2019-01-20 NOTE — Progress Notes (Signed)
Cardiology Office Note:    Date:  01/20/2019   ID:  Raymond Alvarez, DOB 1955/11/29, MRN [id number]  PCP:  Raina Mina., MD  Cardiologist:  Jenne Campus, MD    Referring MD: Raina Mina., MD   Chief Complaint  Patient presents with  . Follow-up  Doing very well  History of Present Illness:    Raymond Alvarez is a 63 y.o. male with severe nonischemic cardiomyopathy, also left bundle branch block, on appropriate guidelines directed medical therapy recent BiV implant with ICD.  Recovering very nicely feeling slightly better overall doing well.  Denies have any chest pain tightness squeezing pressure burning chest.  Overall she is very happy with the care that he got in [facility].  Denies having any chest pain, tightness, pressure, burning, squeezing the chest no shortness of breath.  Past Medical History:  Diagnosis Date  . Anemia   . Anxiety   . Asthma   . Chronic kidney disease   . Colon polyp   . Depression   . Diverticulitis   . GERD (gastroesophageal reflux disease)   . Gout   . Hypertension   . Prostate cancer ([place])   . Seasonal allergies   . Wears glasses     Past Surgical History:  Procedure Laterality Date  . APPENDECTOMY  1970  . BIV ICD INSERTION CRT-D N/A 01/13/2019   Procedure: BIV ICD INSERTION CRT-D;  Surgeon: Constance Haw, MD;  Location: [place] [facility];  Service: Cardiovascular;  Laterality: N/A;  . COLONOSCOPY  09/24/2016   Colonic polyp status post polypectomy. Tubular adenoma.   . ESOPHAGOGASTRODUODENOSCOPY  12/10/2016   Small hiatal hernia. Mild gastritis. Gastric polyps status post polypectomy x 4   . MASS EXCISION Left 12/15/2013   Procedure: EXCISION MUCOID TUMOR LEFT INDEX DEBRIDEMENT DISTAL INTERPHALANGEAL JOINT LEFT INDEX FINGER;  Surgeon: Daryll Brod, MD;  Location: [facility];  Service: Orthopedics;  Laterality: Left;  . PROSTATECTOMY  2015  . TONSILLECTOMY      Current Medications: Current Meds   Medication Sig  . Albuterol Sulfate (PROAIR RESPICLICK) 123XX123 (90 Base) MCG/ACT AEPB Inhale 1-2 puffs into the lungs every 6 (six) hours as needed (wheezing/shortness of breath).  Marland Kitchen allopurinol (ZYLOPRIM) 300 MG tablet Take 300 mg by mouth daily.  . carvedilol (COREG) 12.5 MG tablet TAKE 1 TABLET(12.5 MG) BY MOUTH TWICE DAILY (Patient taking differently: Take 12.5 mg by mouth 2 (two) times daily. )  . cyanocobalamin (,VITAMIN B-12,) 1000 MCG/ML injection Inject 1,000 mcg into the muscle every 30 (thirty) days.  . diclofenac Sodium (VOLTAREN) 1 % GEL Apply 1 application topically daily. Applied to right knee  . dicyclomine (BENTYL) 10 MG capsule Take 1 capsule (10 mg total) by mouth 4 (four) times daily -  before meals and at bedtime.  Marland Kitchen ENTRESTO 49-51 MG TAKE 1 TABLET BY MOUTH TWICE DAILY  . ferrous sulfate 324 MG TBEC Take 324 mg by mouth every Monday, Wednesday, and Friday.  . fluticasone (FLONASE) 50 MCG/ACT nasal spray Place 1 spray into both nostrils daily.  . furosemide (LASIX) 20 MG tablet TAKE 1 TABLET(20 MG) BY MOUTH DAILY (Patient taking differently: Take 20 mg by mouth daily. )  . Ginger, Zingiber officinalis, (GINGER ROOT) 550 MG CAPS Take 550 mg by mouth daily.  Marland Kitchen GNP GARLIC EXTRACT PO Take [id number] mg by mouth 2 (two) times daily.  . hyoscyamine (LEVSIN SL) 0.125 MG SL tablet Place 1 tablet (0.125 mg total) under the tongue every  4 (four) hours as needed. (Patient taking differently: Place 0.125 mg under the tongue every 4 (four) hours as needed for cramping. )  . indomethacin (INDOCIN) 25 MG capsule Take 25 mg by mouth 2 (two) times daily with a meal.  . LORazepam (ATIVAN) 1 MG tablet Take 1 mg by mouth daily as needed for anxiety.  . methocarbamol (ROBAXIN) 500 MG tablet Take 500 mg by mouth 4 (four) times daily as needed for muscle spasms.  Marland Kitchen olopatadine (PATADAY) 0.1 % ophthalmic solution Place 1 drop into both eyes daily.  Marland Kitchen omeprazole (PRILOSEC) 20 MG capsule Take 20 mg by mouth  daily.  Marland Kitchen OVER THE COUNTER MEDICATION Take 1 capsule by mouth 2 (two) times daily. Parsley Leaves  . Probiotic Product (PROBIOTIC PO) Take 1 capsule by mouth daily.  Marland Kitchen pyridOXINE (VITAMIN B-6) 100 MG tablet Take 200 mg by mouth daily.  Marland Kitchen spironolactone (ALDACTONE) 25 MG tablet Take 25 mg by mouth daily.   . Turmeric 500 MG TABS Take 500 mg by mouth daily.  . vitamin C (VITAMIN C) 500 MG tablet Take 1 tablet (500 mg total) by mouth daily.  Marland Kitchen vortioxetine HBr (TRINTELLIX) 20 MG TABS tablet Take 20 mg by mouth every evening.   . zinc sulfate 220 (50 Zn) MG capsule Take 1 capsule (220 mg total) by mouth daily.     Allergies:   Other and Sulfa antibiotics   Social History   Socioeconomic History  . Marital status: Married    Spouse name: Not on file  . Number of children: 2  . Years of education: Not on file  . Highest education level: Not on file  Occupational History  . Occupation: Sport and exercise psychologist  Tobacco Use  . Smoking status: Never Smoker  . Smokeless tobacco: Never Used  Substance and Sexual Activity  . Alcohol use: Yes    Comment: occ  . Drug use: No  . Sexual activity: Not on file  Other Topics Concern  . Not on file  Social History Narrative  . Not on file   Social Determinants of Health   Financial Resource Strain:   . Difficulty of Paying Living Expenses: Not on file  Food Insecurity:   . Worried About Charity fundraiser in the Last Year: Not on file  . Ran Out of Food in the Last Year: Not on file  Transportation Needs:   . Lack of Transportation (Medical): Not on file  . Lack of Transportation (Non-Medical): Not on file  Physical Activity:   . Days of Exercise per Week: Not on file  . Minutes of Exercise per Session: Not on file  Stress:   . Feeling of Stress : Not on file  Social Connections:   . Frequency of Communication with Friends and Family: Not on file  . Frequency of Social Gatherings with Friends and Family: Not on file  . Attends  Religious Services: Not on file  . Active Member of Clubs or Organizations: Not on file  . Attends Archivist Meetings: Not on file  . Marital Status: Not on file     Family History: The patient's family history includes Breast cancer in his mother; Prostate cancer in his father and maternal grandfather. ROS:   Please see the history of present illness.    All 14 point review of systems negative except as described per history of present illness  EKGs/Labs/Other Studies Reviewed:      Recent Labs: 05/12/2018: TSH 0.939 05/17/2018: ALT 72;  Magnesium 2.2 12/27/2018: BUN 22; Creatinine, Ser 1.25; Hemoglobin 13.7; Platelets 277; Potassium 4.7; Sodium 138  Recent Lipid Panel No results found for: CHOL, TRIG, HDL, CHOLHDL, VLDL, LDLCALC, LDLDIRECT  Physical Exam:    VS:  BP 100/60   Pulse 73   Ht 6\' 2"  (1.88 m)   Wt 273 lb (123.8 kg)   SpO2 97%   BMI 35.05 kg/m     Wt Readings from Last 3 Encounters:  01/20/19 273 lb (123.8 kg)  01/13/19 272 lb (123.4 kg)  12/27/18 274 lb 3.2 oz (124.4 kg)     GEN:  Well nourished, well developed in no acute distress HEENT: Normal NECK: No JVD; No carotid bruits LYMPHATICS: No lymphadenopathy CARDIAC: RRR, no murmurs, no rubs, no gallops RESPIRATORY:  Clear to auscultation without rales, wheezing or rhonchi  ABDOMEN: Soft, non-tender, non-distended MUSCULOSKELETAL:  No edema; No deformity  SKIN: Warm and dry LOWER EXTREMITIES: no swelling NEUROLOGIC:  Alert and oriented x 3 PSYCHIATRIC:  Normal affect   ASSESSMENT:    1. Dilated cardiomyopathy ([place])   2. Benign essential hypertension   3. LBBB (left bundle branch block)    PLAN:    In order of problems listed above:  1. Dilated cardiomyopathy [place] appropriate medical therapy I wish to be able to add Aldactone to his medical therapy however his blood pressure is already low he may also benefit from medication like Jardiance.  I will continue discussion about this.  For  now I want him to recover from his recent ICD implantation. 2. Benign essential hypertension the office problem right now blood pressure being low.  Continue present management. 3. Left bundle branch block that being addressed with dual-chamber ICD.   Medication Adjustments/Labs and Tests Ordered: Current medicines are reviewed at length with the patient today.  Concerns regarding medicines are outlined above.  No orders of the defined types were placed in this encounter.  Medication changes: No orders of the defined types were placed in this encounter.   Signed, Park Liter, MD, [facility] 01/20/2019 8:53 AM    [place]

## 2019-01-25 ENCOUNTER — Other Ambulatory Visit: Payer: Self-pay

## 2019-01-25 ENCOUNTER — Ambulatory Visit (INDEPENDENT_AMBULATORY_CARE_PROVIDER_SITE_OTHER): Payer: BC Managed Care – PPO | Admitting: Student

## 2019-01-25 DIAGNOSIS — I447 Left bundle-branch block, unspecified: Secondary | ICD-10-CM

## 2019-01-25 DIAGNOSIS — I429 Cardiomyopathy, unspecified: Secondary | ICD-10-CM

## 2019-01-25 LAB — CUP PACEART INCLINIC DEVICE CHECK
Battery Remaining Percentage: 95 %
Brady Statistic AP VP Percent: 4.1 %
Brady Statistic AS VP Percent: 96 %
Brady Statistic RV Percent Paced: 99 %
Date Time Interrogation Session: 20201222092110
Implantable Lead Implant Date: 20201210
Implantable Lead Implant Date: 20201210
Implantable Lead Implant Date: 20201210
Implantable Lead Location: 753858
Implantable Lead Location: 753859
Implantable Lead Location: 753860
Implantable Pulse Generator Implant Date: 20201210
Lead Channel Pacing Threshold Amplitude: 0.5 V
Lead Channel Pacing Threshold Amplitude: 0.75 V
Lead Channel Pacing Threshold Amplitude: 0.75 V
Lead Channel Pacing Threshold Pulse Width: 0.5 ms
Lead Channel Pacing Threshold Pulse Width: 0.5 ms
Lead Channel Pacing Threshold Pulse Width: 0.5 ms
Pulse Gen Serial Number: 111014317

## 2019-01-25 NOTE — Progress Notes (Signed)
Wound check appointment. Steri-strips removed. Wound without redness or edema. Incision edges approximated, wound well healed. Normal device function. Thresholds, sensing, and impedances consistent with implant measurements. Device programmed at 3.5V for extra safety margin until 3 month visit. Histogram distribution appropriate for patient and level of activity. No mode switches or ventricular arrhythmias noted. Patient educated about wound care, arm mobility, lifting restrictions, shock plan. ROV in 3 months with Dr Camnitz 

## 2019-02-14 ENCOUNTER — Encounter: Payer: Self-pay | Admitting: Gastroenterology

## 2019-02-14 ENCOUNTER — Telehealth (INDEPENDENT_AMBULATORY_CARE_PROVIDER_SITE_OTHER): Payer: BC Managed Care – PPO | Admitting: Gastroenterology

## 2019-02-14 ENCOUNTER — Other Ambulatory Visit: Payer: Self-pay

## 2019-02-14 VITALS — Ht 74.0 in | Wt 272.0 lb

## 2019-02-14 DIAGNOSIS — K580 Irritable bowel syndrome with diarrhea: Secondary | ICD-10-CM

## 2019-02-14 NOTE — Progress Notes (Signed)
IMPRESSION and PLAN:    #1. IBS with diarrhea -Continue bentyl 10mg  po qid #120 with 11 refills -Continue Levsin 0.125mg  sublingual every 4-6 hours as needed, #120 with 8 refills #2. GERD with small HH #3. Fatty liver  Plan: -Continue walking every day and gradually reduce weight. -FU in 1 year, earlier in case of any problems.      HPI:    Chief Complaint:   Raymond Alvarez is a 64 y.o. male  Here for medication refill. Doing great. No GI complaints except occasional diarrhea, better with Bentyl.   Adm to [facility] d/t covid-19 [id number] complicated by severe hyponatremia d/t SIADH, ARF, severe nonischemic cardiomyopathy s/p  ICD 01/2019.  Doing well from cardiac standpoint.  Past GI work-up: -Colonoscopy 09/2016: Colonic polyps s/p polypectomy (TAs), mild sigmoid diverticulosis. Neg random Bx. Next colon due 09/2021.  Past Medical History:  Diagnosis Date  . Anemia   . Anxiety   . Asthma   . Chronic kidney disease   . Colon polyp   . Depression   . Diverticulitis   . GERD (gastroesophageal reflux disease)   . Gout   . Hypertension   . Prostate cancer ([place])   . Seasonal allergies   . Wears glasses     Current Outpatient Medications  Medication Sig Dispense Refill  . Albuterol Sulfate (PROAIR RESPICLICK) 123XX123 (90 Base) MCG/ACT AEPB Inhale 1-2 puffs into the lungs every 6 (six) hours as needed (wheezing/shortness of breath).    Marland Kitchen allopurinol (ZYLOPRIM) 300 MG tablet Take 300 mg by mouth daily.    . carvedilol (COREG) 12.5 MG tablet TAKE 1 TABLET(12.5 MG) BY MOUTH TWICE DAILY (Patient taking differently: Take 12.5 mg by mouth 2 (two) times daily. ) 60 tablet 1  . cyanocobalamin (,VITAMIN B-12,) 1000 MCG/ML injection Inject 1,000 mcg into the muscle every 30 (thirty) days.    . diclofenac Sodium (VOLTAREN) 1 % GEL Apply 1 application topically daily. Applied to right knee    . dicyclomine (BENTYL) 10 MG capsule Take 1 capsule (10 mg total) by mouth 4 (four) times  daily -  before meals and at bedtime. 120 capsule 11  . ENTRESTO 49-51 MG TAKE 1 TABLET BY MOUTH TWICE DAILY 60 tablet 3  . ferrous sulfate 324 MG TBEC Take 324 mg by mouth every Monday, Wednesday, and Friday.    . fluticasone (FLONASE) 50 MCG/ACT nasal spray Place 1 spray into both nostrils daily.    . furosemide (LASIX) 20 MG tablet TAKE 1 TABLET(20 MG) BY MOUTH DAILY (Patient taking differently: Take 20 mg by mouth daily. ) 30 tablet 1  . Ginger, Zingiber officinalis, (GINGER ROOT) 550 MG CAPS Take 550 mg by mouth daily.    Marland Kitchen GNP GARLIC EXTRACT PO Take [id number] mg by mouth 2 (two) times daily.    . hyoscyamine (LEVSIN SL) 0.125 MG SL tablet Place 1 tablet (0.125 mg total) under the tongue every 4 (four) hours as needed. (Patient taking differently: Place 0.125 mg under the tongue every 4 (four) hours as needed for cramping. ) 120 tablet 8  . indomethacin (INDOCIN) 25 MG capsule Take 25 mg by mouth 2 (two) times daily with a meal.    . LORazepam (ATIVAN) 1 MG tablet Take 1 mg by mouth daily as needed for anxiety.    . methocarbamol (ROBAXIN) 500 MG tablet Take 500 mg by mouth 4 (four) times daily as needed for muscle spasms.    Marland Kitchen olopatadine (PATADAY) 0.1 % ophthalmic  solution Place 1 drop into both eyes daily.    Marland Kitchen omeprazole (PRILOSEC) 20 MG capsule Take 20 mg by mouth daily.    Marland Kitchen OVER THE COUNTER MEDICATION Take 1 capsule by mouth 2 (two) times daily. Parsley Leaves    . Probiotic Product (PROBIOTIC PO) Take 1 capsule by mouth daily.    Marland Kitchen pyridOXINE (VITAMIN B-6) 100 MG tablet Take 200 mg by mouth daily.    Marland Kitchen spironolactone (ALDACTONE) 25 MG tablet Take 25 mg by mouth daily.     . Turmeric 500 MG TABS Take 500 mg by mouth daily.    . vitamin C (VITAMIN C) 500 MG tablet Take 1 tablet (500 mg total) by mouth daily. 30 tablet 0  . vortioxetine HBr (TRINTELLIX) 20 MG TABS tablet Take 20 mg by mouth every evening.     . zinc sulfate 220 (50 Zn) MG capsule Take 1 capsule (220 mg total) by mouth  daily. 30 capsule 0   No current facility-administered medications for this visit.    Past Surgical History:  Procedure Laterality Date  . APPENDECTOMY  1970  . BIV ICD INSERTION CRT-D N/A 01/13/2019   Procedure: BIV ICD INSERTION CRT-D;  Surgeon: Constance Haw, MD;  Location: [place] [facility];  Service: Cardiovascular;  Laterality: N/A;  . COLONOSCOPY  09/24/2016   Colonic polyp status post polypectomy. Tubular adenoma.   . ESOPHAGOGASTRODUODENOSCOPY  12/10/2016   Small hiatal hernia. Mild gastritis. Gastric polyps status post polypectomy x 4   . MASS EXCISION Left 12/15/2013   Procedure: EXCISION MUCOID TUMOR LEFT INDEX DEBRIDEMENT DISTAL INTERPHALANGEAL JOINT LEFT INDEX FINGER;  Surgeon: Daryll Brod, MD;  Location: [facility];  Service: Orthopedics;  Laterality: Left;  . PROSTATECTOMY  2015  . TONSILLECTOMY      Family History  Problem Relation Age of Onset  . Breast cancer Mother   . Prostate cancer Father   . Prostate cancer Maternal Grandfather   . Colon cancer Neg Hx     Social History   Tobacco Use  . Smoking status: Never Smoker  . Smokeless tobacco: Never Used  Substance Use Topics  . Alcohol use: Yes    Comment: occ  . Drug use: No    Allergies  Allergen Reactions  . Other Anaphylaxis    TREE NUTS  . Sulfa Antibiotics      Review of Systems: All systems reviewed and negative except where noted in HPI.    Physical Exam:     Ht 6\' 2"  (1.88 m)   Wt 272 lb (123.4 kg)   BMI 34.92 kg/m  televisit  CBC Latest Ref Rng & Units 12/27/2018 05/17/2018 05/16/2018  WBC 3.4 - 10.8 x10E3/uL 9.4 9.9 8.7  Hemoglobin 13.0 - 17.7 g/dL 13.7 14.5 13.6  Hematocrit 37.5 - 51.0 % 40.2 40.4 39.0  Platelets 150 - 450 x10E3/uL 277 367 274   CMP Latest Ref Rng & Units 12/27/2018 10/15/2018 10/04/2018  Glucose 65 - 99 mg/dL 107(H) 99 117(H)  BUN 8 - 27 mg/dL 22 22 17   Creatinine 0.76 - 1.27 mg/dL 1.25 1.12 1.27  Sodium 134 - 144 mmol/L 138 136 139   Potassium 3.5 - 5.2 mmol/L 4.7 4.5 4.7  Chloride 96 - 106 mmol/L 102 102 103  CO2 20 - 29 mmol/L 20 20 23   Calcium 8.6 - 10.2 mg/dL 9.2 9.6 9.6  Total Protein 6.5 - 8.1 g/dL - - -  Total Bilirubin 0.3 - 1.2 mg/dL - - -  Alkaline Phos 38 - 126 U/L - - -  AST 15 - 41 U/L - - -  ALT 0 - 44 U/L - - -  I connected with  [name] on 02/14/19 by a telephone telemedicine application and verified that I am speaking with the correct person using two identifiers.   I discussed the limitations of evaluation and management by telemedicine. The patient expressed understanding and agreed to proceed.  Time spent 15 minutes.     Savoy Somerville,MD 02/14/2019, 10:23 AM   CC Raina Mina., MD

## 2019-02-14 NOTE — Patient Instructions (Signed)
If you are age 62 or older, your body mass index should be between 23-30. Your Body mass index is 34.92 kg/m. If this is out of the aforementioned range listed, please consider follow up with your Primary Care Provider.  If you are age 65 or younger, your body mass index should be between 19-25. Your Body mass index is 34.92 kg/m. If this is out of the aformentioned range listed, please consider follow up with your Primary Care Provider.   We have sent the following medications to your pharmacy for you to pick up at your convenience: Bentyl  Levsin   Continue walking every day to try to reduce weight.   Follow up in 1 year.   Thank you,  Dr. Jackquline Denmark

## 2019-03-04 ENCOUNTER — Other Ambulatory Visit: Payer: Self-pay | Admitting: Cardiology

## 2019-03-09 ENCOUNTER — Other Ambulatory Visit: Payer: Self-pay | Admitting: Cardiology

## 2019-03-09 NOTE — Telephone Encounter (Signed)
Rx refill sent to pharmacy. 

## 2019-03-28 ENCOUNTER — Ambulatory Visit: Payer: BC Managed Care – PPO | Admitting: Cardiology

## 2019-04-15 ENCOUNTER — Ambulatory Visit (INDEPENDENT_AMBULATORY_CARE_PROVIDER_SITE_OTHER): Payer: BC Managed Care – PPO | Admitting: *Deleted

## 2019-04-15 DIAGNOSIS — I420 Dilated cardiomyopathy: Secondary | ICD-10-CM | POA: Diagnosis not present

## 2019-04-17 LAB — CUP PACEART REMOTE DEVICE CHECK
Battery Remaining Longevity: 53 mo
Battery Remaining Percentage: 93 %
Battery Voltage: 2.96 V
Brady Statistic AP VP Percent: 3.9 %
Brady Statistic AP VS Percent: 1 %
Brady Statistic AS VP Percent: 96 %
Brady Statistic AS VS Percent: 1 %
Brady Statistic RA Percent Paced: 3.8 %
Date Time Interrogation Session: 20210312021030
HighPow Impedance: 73 Ohm
Implantable Lead Implant Date: 20201210
Implantable Lead Implant Date: 20201210
Implantable Lead Implant Date: 20201210
Implantable Lead Location: 753858
Implantable Lead Location: 753859
Implantable Lead Location: 753860
Implantable Pulse Generator Implant Date: 20201210
Lead Channel Impedance Value: 440 Ohm
Lead Channel Impedance Value: 500 Ohm
Lead Channel Impedance Value: 510 Ohm
Lead Channel Pacing Threshold Amplitude: 0.5 V
Lead Channel Pacing Threshold Amplitude: 0.75 V
Lead Channel Pacing Threshold Amplitude: 0.75 V
Lead Channel Pacing Threshold Pulse Width: 0.5 ms
Lead Channel Pacing Threshold Pulse Width: 0.5 ms
Lead Channel Pacing Threshold Pulse Width: 0.5 ms
Lead Channel Sensing Intrinsic Amplitude: 12 mV
Lead Channel Sensing Intrinsic Amplitude: 5 mV
Lead Channel Setting Pacing Amplitude: 3.5 V
Lead Channel Setting Pacing Amplitude: 3.5 V
Lead Channel Setting Pacing Amplitude: 3.5 V
Lead Channel Setting Pacing Pulse Width: 0.5 ms
Lead Channel Setting Pacing Pulse Width: 0.5 ms
Lead Channel Setting Sensing Sensitivity: 0.5 mV
Pulse Gen Serial Number: 111014317

## 2019-04-17 NOTE — Progress Notes (Signed)
ICD remote 

## 2019-04-25 DIAGNOSIS — G4733 Obstructive sleep apnea (adult) (pediatric): Secondary | ICD-10-CM | POA: Insufficient documentation

## 2019-04-25 HISTORY — DX: Obstructive sleep apnea (adult) (pediatric): G47.33

## 2019-05-03 ENCOUNTER — Other Ambulatory Visit: Payer: Self-pay

## 2019-05-03 ENCOUNTER — Ambulatory Visit: Payer: BC Managed Care – PPO | Admitting: Cardiology

## 2019-05-03 VITALS — BP 108/76 | HR 82 | Ht 74.0 in | Wt 283.0 lb

## 2019-05-03 DIAGNOSIS — I428 Other cardiomyopathies: Secondary | ICD-10-CM | POA: Diagnosis not present

## 2019-05-03 LAB — CUP PACEART INCLINIC DEVICE CHECK
Battery Remaining Longevity: 78 mo
Brady Statistic RA Percent Paced: 3.6 %
Brady Statistic RV Percent Paced: 99.47 %
Date Time Interrogation Session: 20210330130700
HighPow Impedance: 70.875
Implantable Lead Implant Date: 20201210
Implantable Lead Implant Date: 20201210
Implantable Lead Implant Date: 20201210
Implantable Lead Location: 753858
Implantable Lead Location: 753859
Implantable Lead Location: 753860
Implantable Pulse Generator Implant Date: 20201210
Lead Channel Impedance Value: 450 Ohm
Lead Channel Impedance Value: 512.5 Ohm
Lead Channel Impedance Value: 537.5 Ohm
Lead Channel Pacing Threshold Amplitude: 0.5 V
Lead Channel Pacing Threshold Amplitude: 0.75 V
Lead Channel Pacing Threshold Amplitude: 0.75 V
Lead Channel Pacing Threshold Pulse Width: 0.5 ms
Lead Channel Pacing Threshold Pulse Width: 0.5 ms
Lead Channel Pacing Threshold Pulse Width: 0.5 ms
Lead Channel Sensing Intrinsic Amplitude: 5 mV
Lead Channel Sensing Intrinsic Amplitude: 9.9 mV
Lead Channel Setting Pacing Amplitude: 2 V
Lead Channel Setting Pacing Amplitude: 2.5 V
Lead Channel Setting Pacing Amplitude: 2.5 V
Lead Channel Setting Pacing Pulse Width: 0.5 ms
Lead Channel Setting Pacing Pulse Width: 0.5 ms
Lead Channel Setting Sensing Sensitivity: 0.5 mV
Pulse Gen Serial Number: 111014317

## 2019-05-03 NOTE — Progress Notes (Signed)
Electrophysiology Office Note   Date:  05/03/2019   ID:  Raymond Alvarez, DOB Jun 08, 1955, MRN [id number]  PCP:  Raina Mina., MD  Cardiologist: Agustin Cree Primary Electrophysiologist:  Torry Istre Meredith Leeds, MD    Chief Complaint: CHF   History of Present Illness: Raymond Alvarez is a 64 y.o. male who is being seen today for the evaluation of CHF at the request of Raymond Alvarez. Presenting today for electrophysiology evaluation.  He has a history of CHF, CKD, hypertension.  Has been on optimal medical therapy.  Despite this his ejection fraction remains low.  He is now status post [place] CRT-D implanted 01/13/2019.  Today, denies symptoms of palpitations, chest pain, shortness of breath, orthopnea, PND, lower extremity edema, claudication, dizziness, presyncope, syncope, bleeding, or neurologic sequela. The patient is tolerating medications without difficulties.  His device has been implanted he has done well.  He has had no chest pain.  His shortness of breath has improved and he has more good days than bad days.   Past Medical History:  Diagnosis Date  . Anemia   . Anxiety   . Anxiety disorder 03/20/2015  . Asthma   . Benign essential hypertension 03/20/2015   Last Assessment & Plan:  Relevant Hx: Course: Daily Update: Today's Plan:this appears stable overall  Electronically signed by: Baldemar Friday, [place] 03/20/15 1509  Last Assessment & Plan:  Formatting of this note might be different from the original. Relevant Hx: Course: Daily Update: Today's Plan:this appears stable overall  Electronically signed by: Baldemar Friday, [place] 03/20/15   . Chronic kidney disease   . Class 2 severe obesity due to excess calories with serious comorbidity and body mass index (BMI) of 35.0 to 35.9 in adult ([place]) 03/20/2015  . Colon polyp   . Depression   . Dilated cardiomyopathy ([place]) 07/15/2018   Ejection fraction 30 to 35% based on echo in June 2020  Formatting of this note might  be different from the original. Ejection fraction 30 to 35% based on echo in June 2020  . Diverticulitis   . Dyspnea on exertion 06/25/2018  . Gastroesophageal reflux disease without esophagitis 07/31/2016  . GERD (gastroesophageal reflux disease)   . Gout   . History of 2019 novel coronavirus disease (COVID-19) 05/12/2018  . Hypertension   . Hyponatremia 05/11/2018  . 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 Relevant Hx: Course: Daily Update: Today's Plan:this appears stable overall, Manases Etchison get kenalog to he  . Iron deficiency anemia due to chronic blood loss 01/07/2017  . Mild episode of recurrent major depressive disorder ([place]) 06/17/2018  . NASH (nonalcoholic steatohepatitis) 06/17/2018  . New onset left bundle branch block (LBBB) 05/27/2018  . Obstructive sleep apnea 04/25/2019  . Palpitations 03/20/2015  . Prediabetes 06/17/2018  . Prostate cancer ([place])   . Pulmonary nodule 05/12/2018  . PVC (premature ventricular contraction) 03/20/2015  . Rhabdomyolysis 05/12/2018  . Seasonal allergies   . Suspected COVID-19 virus infection 05/12/2018  . Viral gastroenteritis 05/12/2018  . Wears glasses    Past Surgical History:  Procedure Laterality Date  . APPENDECTOMY  1970  . BIV ICD INSERTION CRT-D N/A 01/13/2019   Procedure: BIV ICD INSERTION CRT-D;  Surgeon: Curt Bears, Shereka Lafortune  Hassell Done, MD;  Location: [facility];  Service: Cardiovascular;  Laterality: N/A;  . COLONOSCOPY  09/24/2016   Colonic polyp status post polypectomy. Tubular adenoma.   . ESOPHAGOGASTRODUODENOSCOPY  12/10/2016   Small hiatal hernia. Mild gastritis. Gastric polyps status post polypectomy x 4   . MASS EXCISION Left 12/15/2013    Procedure: EXCISION MUCOID TUMOR LEFT INDEX DEBRIDEMENT DISTAL INTERPHALANGEAL JOINT LEFT INDEX FINGER;  Surgeon: Daryll Brod, MD;  Location: [facility];  Service: Orthopedics;  Laterality: Left;  . PROSTATECTOMY  2015  . TONSILLECTOMY       Current Outpatient Medications  Medication Sig Dispense Refill  . Albuterol Sulfate (PROAIR RESPICLICK) 123XX123 (90 Base) MCG/ACT AEPB Inhale 1-2 puffs into the lungs every 6 (six) hours as needed (wheezing/shortness of breath).    Marland Kitchen allopurinol (ZYLOPRIM) 300 MG tablet Take 300 mg by mouth daily.    . Black Pepper-Turmeric (TURMERIC COMPLEX/BLACK PEPPER) 3-500 MG CAPS Take 500 mg by mouth daily.    . carvedilol (COREG) 12.5 MG tablet TAKE 1 TABLET(12.5 MG) BY MOUTH TWICE DAILY 60 tablet 1  . cyanocobalamin (,VITAMIN B-12,) 1000 MCG/ML injection Inject 1,000 mcg into the muscle every 30 (thirty) days.    . diclofenac Sodium (VOLTAREN) 1 % GEL Apply 1 application topically daily. Applied to right knee    . dicyclomine (BENTYL) 10 MG capsule Take 1 capsule (10 mg total) by mouth 4 (four) times daily -  before meals and at bedtime. 120 capsule 11  . ENTRESTO 49-51 MG TAKE 1 TABLET BY MOUTH TWICE DAILY 60 tablet 3  . ferrous sulfate 324 MG TBEC Take 324 mg by mouth every Monday, Wednesday, and Friday.    . fluticasone (FLONASE) 50 MCG/ACT nasal spray Place 1 spray into both nostrils daily.    . furosemide (LASIX) 20 MG tablet Take 1 tablet (20 mg total) by mouth daily. 30 tablet 1  . Ginger, Zingiber officinalis, (GINGER ROOT) 550 MG CAPS Take 550 mg by mouth daily.    Marland Kitchen GNP GARLIC EXTRACT PO Take [id number] mg by mouth 2 (two) times daily.    . hyoscyamine (LEVSIN SL) 0.125 MG SL tablet Place 1 tablet (0.125 mg total) under the tongue every 4 (four) hours as needed. (Patient taking differently: Place 0.125 mg under the tongue every 4 (four) hours as needed for cramping. ) 120 tablet 8  . indomethacin (INDOCIN) 25 MG capsule Take 25 mg by mouth 2 (two)  times daily with a meal.    . LORazepam (ATIVAN) 1 MG tablet Take 1 mg by mouth daily as needed for anxiety.    . methocarbamol (ROBAXIN) 500 MG tablet Take 500 mg by mouth 4 (four) times daily as needed for muscle spasms.    Marland Kitchen neomycin-polymyxin-hydrocortisone (CORTISPORIN) 3.5-10000-1 OTIC suspension Place 1 drop into both ears daily.    Marland Kitchen olopatadine (PATADAY) 0.1 % ophthalmic solution Place 1 drop into both eyes daily.    Marland Kitchen omeprazole (PRILOSEC) 20 MG capsule Take 20 mg by mouth daily.    . ondansetron (ZOFRAN) 4 MG tablet Take 4 mg by mouth every 8 (eight) hours as needed.    Marland Kitchen OVER THE COUNTER MEDICATION Take 1 capsule by mouth 2 (two) times daily. Parsley Leaves    . Probiotic Product (PROBIOTIC PO) Take 1 capsule by mouth daily.    Marland Kitchen pyridOXINE (VITAMIN B-6) 100 MG tablet Take 200 mg by mouth daily.    Marland Kitchen spironolactone (ALDACTONE) 25 MG tablet Take 25 mg by mouth  daily.     . Turmeric 500 MG TABS Take 500 mg by mouth daily.    . vitamin C (VITAMIN C) 500 MG tablet Take 1 tablet (500 mg total) by mouth daily. 30 tablet 0  . vortioxetine HBr (TRINTELLIX) 20 MG TABS tablet Take 20 mg by mouth every evening.     . zinc sulfate 220 (50 Zn) MG capsule Take 1 capsule (220 mg total) by mouth daily. 30 capsule 0   No current facility-administered medications for this visit.    Allergies:   Other and Sulfa antibiotics   Social History:  The patient  reports that he has never smoked. He has never used smokeless tobacco. He reports current alcohol use. He reports that he does not use drugs.   Family History:  The patient's family history includes Breast cancer in his mother; Prostate cancer in his father and maternal grandfather.   ROS:  Please see the history of present illness.   Otherwise, review of systems is positive for none.   All other systems are reviewed and negative.   PHYSICAL EXAM: VS:  BP 108/76   Pulse 82   Ht 6\' 2"  (1.88 m)   Wt 283 lb (128.4 kg)   SpO2 99%   BMI 36.34  kg/m  , BMI Body mass index is 36.34 kg/m. GEN: Well nourished, well developed, in no acute distress  HEENT: normal  Neck: no JVD, carotid bruits, or masses Cardiac: RRR; no murmurs, rubs, or gallops,no edema  Respiratory:  clear to auscultation bilaterally, normal work of breathing GI: soft, nontender, nondistended, + BS MS: no deformity or atrophy  Skin: warm and dry, device site well healed Neuro:  Strength and sensation are intact Psych: euthymic mood, full affect  EKG:  EKG is ordered today. Personal review of the ekg ordered shows sinus rhythm, ventricular paced  Personal review of the device interrogation today. Results in [place]: 05/12/2018: TSH 0.939 05/17/2018: ALT 72; Magnesium 2.2 12/27/2018: BUN 22; Creatinine, Ser 1.25; Hemoglobin 13.7; Platelets 277; Potassium 4.7; Sodium 138    Lipid Panel  No results found for: CHOL, TRIG, HDL, CHOLHDL, VLDL, LDLCALC, LDLDIRECT   Wt Readings from Last 3 Encounters:  05/03/19 283 lb (128.4 kg)  02/14/19 272 lb (123.4 kg)  01/20/19 273 lb (123.8 kg)      Other studies Reviewed: Additional studies/ records that were reviewed today include: TTE 12/03/18  Review of the above records today demonstrates:    1. Left ventricular ejection fraction, by visual estimation, is 30 to 35%. The left ventricle has normal function. Left ventricular septal wall thickness was normal. Normal left ventricular posterior wall thickness. There is no left ventricular  hypertrophy.  2. LAD distribution, entire septum, and apical lateral segment are abnormal.  3. Abnormal septal motion.  4. Left ventricular diastolic parameters are consistent with Grade I diastolic dysfunction (impaired relaxation).  5. Mild to moderately dilated left ventricular internal cavity size.  6. Global right ventricle has mildly reduced systolic function.The right ventricular size is normal. No increase in right ventricular wall thickness.  7. Left atrial  size was mildly dilated.  8. Right atrial size was normal.  9. The mitral valve is normal in structure. Mild mitral valve regurgitation. No evidence of mitral stenosis. 10. Mild to moderate tricuspid stenosis. 11. The tricuspid valve is normal in structure. Tricuspid valve regurgitation is mild. 12. The aortic valve is normal in structure. Aortic valve regurgitation is not visualized. No evidence of  aortic valve sclerosis or stenosis. 13. The pulmonic valve was normal in structure. Pulmonic valve regurgitation is not visualized. 14. Severely elevated pulmonary artery systolic pressure. 15. The inferior vena cava is normal in size with greater than 50% respiratory variability, suggesting right atrial pressure of 3 mmHg.   ASSESSMENT AND PLAN:  1.  Chronic systolic heart failure due to nonischemic cardiomyopathy: Currently on carvedilol, Entresto, Aldactone.  He is now status post [place] CRT-D implanted 01/13/2019.  Device functioning appropriately.  No changes at this time.    2.  Hypertension: Currently well controlled   Current medicines are reviewed at length with the patient today.   The patient does not have concerns regarding his medicines.  The following changes were made today:  none  Labs/ tests ordered today include:  Orders Placed This Encounter  Procedures  . EKG 12-Lead     Disposition:   FU with Kateryn Marasigan 9 months  Signed, Mattye Verdone Meredith Leeds, MD  05/03/2019 2:35 PM     [address] [place] [address] [phone] (office) [phone] (fax)

## 2019-05-03 NOTE — Patient Instructions (Signed)
Medication Instructions:  Your physician recommends that you continue on your current medications as directed. Please refer to the Current Medication list given to you today.  *If you need a refill on your cardiac medications before your next appointment, please call your pharmacy*   Lab Work: None ordered If you have labs (blood work) drawn today and your tests are completely normal, you will receive your results only by: Marland Kitchen MyChart Message (if you have MyChart) OR . A paper copy in the mail If you have any lab test that is abnormal or we need to change your treatment, we will call you to review the results.   Testing/Procedures: None ordered   Follow-Up: Remote monitoring is used to monitor your Pacemaker of ICD from home. This monitoring reduces the number of office visits required to check your device to one time per year. It allows [place] to keep an eye on the functioning of your device to ensure it is working properly. You are scheduled for a device check from home on 07/15/2019. You may send your transmission at any time that day. If you have a wireless device, the transmission will be sent automatically. After your physician reviews your transmission, you will receive a postcard with your next transmission date.   At [facility], you and your health needs are our priority.  As part of our continuing mission to provide you with exceptional heart care, we have created designated Provider Care Teams.  These Care Teams include your primary Cardiologist (physician) and Advanced Practice Providers (APPs -  Physician Assistants and Nurse Practitioners) who all work together to provide you with the care you need, when you need it.  We recommend signing up for the patient portal called "MyChart".  Sign up information is provided on this After Visit Summary.  MyChart is used to connect with patients for Virtual Visits (Telemedicine).  Patients are able to view lab/test results, encounter notes,  upcoming appointments, etc.  Non-urgent messages can be sent to your provider as well.   To learn more about what you can do with MyChart, go to [email or web address].    Your next appointment:   9 month(s)  The format for your next appointment:   In Person  Provider:   Allegra Lai, MD   Thank you for choosing [facility]!!   Trinidad Curet, RN [phone]    Other Instructions

## 2019-05-04 ENCOUNTER — Ambulatory Visit (INDEPENDENT_AMBULATORY_CARE_PROVIDER_SITE_OTHER): Payer: BC Managed Care – PPO | Admitting: Cardiology

## 2019-05-04 ENCOUNTER — Encounter: Payer: Self-pay | Admitting: Cardiology

## 2019-05-04 ENCOUNTER — Other Ambulatory Visit: Payer: Self-pay | Admitting: Cardiology

## 2019-05-04 VITALS — BP 108/72 | HR 78 | Ht 74.0 in | Wt 284.0 lb

## 2019-05-04 DIAGNOSIS — I447 Left bundle-branch block, unspecified: Secondary | ICD-10-CM

## 2019-05-04 DIAGNOSIS — I10 Essential (primary) hypertension: Secondary | ICD-10-CM

## 2019-05-04 DIAGNOSIS — I420 Dilated cardiomyopathy: Secondary | ICD-10-CM | POA: Diagnosis not present

## 2019-05-04 DIAGNOSIS — Z95810 Presence of automatic (implantable) cardiac defibrillator: Secondary | ICD-10-CM

## 2019-05-04 HISTORY — DX: Presence of automatic (implantable) cardiac defibrillator: Z95.810

## 2019-05-04 NOTE — Progress Notes (Signed)
Cardiology Office Note:    Date:  05/04/2019   ID:  Raymond Alvarez, DOB 08-16-1955, MRN [id number]  PCP:  Raina Mina., MD  Cardiologist:  Jenne Campus, MD    Referring MD: Raina Mina., MD   Chief Complaint  Patient presents with  . Follow-up  Doing well  History of Present Illness:    Raymond Alvarez is a 64 y.o. male with cardiomyopathy, nonischemic.  Left bundle branch block, status post ICD implant in December.  This is CRT-D device.  He comes today to my office follow-up overall feeling better.  He is having more energy he is able to do more.  However, describe episode that he is getting lightheaded.  He had to lay down for about 5 minutes and then sensation goes away.  He did not check his blood pressure during those episodes.  Otherwise he is active he is able to walk climb stairs with no difficulties.  Feeling much better after implantation of CRT device.  Past Medical History:  Diagnosis Date  . Anemia   . Anxiety   . Anxiety disorder 03/20/2015  . Asthma   . Benign essential hypertension 03/20/2015   Last Assessment & Plan:  Relevant Hx: Course: Daily Update: Today's Plan:this appears stable overall  Electronically signed by: Baldemar Friday, [place] 03/20/15 1509  Last Assessment & Plan:  Formatting of this note might be different from the original. Relevant Hx: Course: Daily Update: Today's Plan:this appears stable overall  Electronically signed by: Baldemar Friday, [place] 03/20/15   . Chronic kidney disease   . Class 2 severe obesity due to excess calories with serious comorbidity and body mass index (BMI) of 35.0 to 35.9 in adult ([place]) 03/20/2015  . Colon polyp   . Depression   . Dilated cardiomyopathy ([place]) 07/15/2018   Ejection fraction 30 to 35% based on echo in June 2020  Formatting of this note might be different from the original. Ejection fraction 30 to 35% based on echo in June 2020  . Diverticulitis   . Dyspnea on exertion 06/25/2018  .  Gastroesophageal reflux disease without esophagitis 07/31/2016  . GERD (gastroesophageal reflux disease)   . Gout   . History of 2019 novel coronavirus disease (COVID-19) 05/12/2018  . Hypertension   . Hyponatremia 05/11/2018  . 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 Relevant Hx: Course: Daily Update: Today's Plan:this appears stable overall, will get kenalog to he  . Iron deficiency anemia due to chronic blood loss 01/07/2017  . Mild episode of recurrent major depressive disorder ([place]) 06/17/2018  . NASH (nonalcoholic steatohepatitis) 06/17/2018  . New onset left bundle branch block (LBBB) 05/27/2018  . Obstructive sleep apnea 04/25/2019  . Palpitations 03/20/2015  . Prediabetes 06/17/2018  . Prostate cancer ([place])   . Pulmonary nodule 05/12/2018  . PVC (premature ventricular contraction) 03/20/2015  . Rhabdomyolysis 05/12/2018  . Seasonal allergies   . Suspected COVID-19 virus infection 05/12/2018  . Viral gastroenteritis 05/12/2018  . Wears glasses     Past Surgical History:  Procedure Laterality Date  . APPENDECTOMY  1970  . BIV ICD INSERTION CRT-D N/A 01/13/2019   Procedure: BIV ICD INSERTION CRT-D;  Surgeon: Constance Haw, MD;  Location:  [facility];  Service: Cardiovascular;  Laterality: N/A;  . COLONOSCOPY  09/24/2016   Colonic polyp status post polypectomy. Tubular adenoma.   . ESOPHAGOGASTRODUODENOSCOPY  12/10/2016   Small hiatal hernia. Mild gastritis. Gastric polyps status post polypectomy x 4   . MASS EXCISION Left 12/15/2013   Procedure: EXCISION MUCOID TUMOR LEFT INDEX DEBRIDEMENT DISTAL INTERPHALANGEAL JOINT LEFT INDEX FINGER;  Surgeon: Daryll Brod, MD;  Location:  [facility];  Service: Orthopedics;  Laterality: Left;  . PROSTATECTOMY  2015  . TONSILLECTOMY      Current Medications: Current Meds  Medication Sig  . Albuterol Sulfate (PROAIR RESPICLICK) 123XX123 (90 Base) MCG/ACT AEPB Inhale 1-2 puffs into the lungs every 6 (six) hours as needed (wheezing/shortness of breath).  Marland Kitchen allopurinol (ZYLOPRIM) 300 MG tablet Take 300 mg by mouth daily.  . Black Pepper-Turmeric (TURMERIC COMPLEX/BLACK PEPPER) 3-500 MG CAPS Take 500 mg by mouth daily.  . carvedilol (COREG) 12.5 MG tablet TAKE 1 TABLET(12.5 MG) BY MOUTH TWICE DAILY  . cyanocobalamin (,VITAMIN B-12,) 1000 MCG/ML injection Inject 1,000 mcg into the muscle every 30 (thirty) days.  . diclofenac Sodium (VOLTAREN) 1 % GEL Apply 1 application topically daily. Applied to right knee  . dicyclomine (BENTYL) 10 MG capsule Take 1 capsule (10 mg total) by mouth 4 (four) times daily -  before meals and at bedtime.  Marland Kitchen ENTRESTO 49-51 MG TAKE 1 TABLET BY MOUTH TWICE DAILY  . ferrous sulfate 324 MG TBEC Take 324 mg by mouth every Monday, Wednesday, and Friday.  . fluticasone (FLONASE) 50 MCG/ACT nasal spray Place 1 spray into both nostrils daily.  . Ginger, Zingiber officinalis, (GINGER ROOT) 550 MG CAPS Take 550 mg by mouth daily.  Marland Kitchen GNP GARLIC EXTRACT PO Take [id number] mg by mouth 2 (two) times daily.  . hyoscyamine (LEVSIN SL) 0.125 MG SL tablet Place 1 tablet (0.125 mg total) under the tongue every 4 (four) hours as needed. (Patient taking differently: Place 0.125 mg under the tongue every 4 (four) hours as needed for cramping. )  . indomethacin (INDOCIN) 25 MG capsule Take 25 mg by mouth 2 (two) times daily with a meal.  . LORazepam (ATIVAN) 1 MG tablet Take 1 mg by mouth daily as needed for anxiety.  . methocarbamol (ROBAXIN) 500 MG tablet Take 500 mg by mouth 4 (four) times daily as needed for muscle spasms.  Marland Kitchen neomycin-polymyxin-hydrocortisone (CORTISPORIN) 3.5-10000-1 OTIC suspension Place 1 drop into  both ears daily.  Marland Kitchen olopatadine (PATADAY) 0.1 % ophthalmic solution Place 1 drop into both eyes daily.  Marland Kitchen omeprazole (PRILOSEC) 20 MG capsule Take 20 mg by mouth daily.  . ondansetron (ZOFRAN) 4 MG tablet Take 4 mg by mouth every 8 (eight) hours as needed.  Marland Kitchen OVER THE COUNTER MEDICATION Take 1 capsule by mouth 2 (two) times daily. Parsley Leaves  . Probiotic Product (PROBIOTIC PO) Take 1 capsule by mouth daily.  Marland Kitchen pyridOXINE (VITAMIN B-6) 100 MG tablet Take 200 mg by mouth daily.  Marland Kitchen spironolactone (ALDACTONE) 25 MG tablet Take 25 mg by mouth daily.   . Turmeric 500 MG TABS Take 500 mg by mouth daily.  . vitamin C (VITAMIN C) 500 MG tablet Take 1 tablet (500 mg total) by mouth daily.  Marland Kitchen vortioxetine HBr (TRINTELLIX) 20 MG TABS tablet Take 20 mg by mouth every evening.   . zinc sulfate 220 (50 Zn) MG capsule Take 1 capsule (220 mg total) by mouth daily.  . [DISCONTINUED] furosemide (LASIX) 20 MG tablet  Take 1 tablet (20 mg total) by mouth daily.     Allergies:   Other and Sulfa antibiotics   Social History   Socioeconomic History  . Marital status: Married    Spouse name: Not on file  . Number of children: 2  . Years of education: Not on file  . Highest education level: Not on file  Occupational History  . Occupation: Sport and exercise psychologist  Tobacco Use  . Smoking status: Never Smoker  . Smokeless tobacco: Never Used  Substance and Sexual Activity  . Alcohol use: Yes    Comment: occ  . Drug use: No  . Sexual activity: Not on file  Other Topics Concern  . Not on file  Social History Narrative  . Not on file   Social Determinants of Health   Financial Resource Strain:   . Difficulty of Paying Living Expenses:   Food Insecurity:   . Worried About Charity fundraiser in the Last Year:   . Arboriculturist in the Last Year:   Transportation Needs:   . Film/video editor (Medical):   Marland Kitchen Lack of Transportation (Non-Medical):   Physical Activity:   . Days of Exercise per  Week:   . Minutes of Exercise per Session:   Stress:   . Feeling of Stress :   Social Connections:   . Frequency of Communication with Friends and Family:   . Frequency of Social Gatherings with Friends and Family:   . Attends Religious Services:   . Active Member of Clubs or Organizations:   . Attends Archivist Meetings:   Marland Kitchen Marital Status:      Family History: The patient's family history includes Breast cancer in his mother; Prostate cancer in his father and maternal grandfather. There is no history of Colon cancer. ROS:   Please see the history of present illness.    All 14 point review of systems negative except as described per history of present illness  EKGs/Labs/Other Studies Reviewed:      Recent Labs: 05/12/2018: TSH 0.939 05/17/2018: ALT 72; Magnesium 2.2 12/27/2018: BUN 22; Creatinine, Ser 1.25; Hemoglobin 13.7; Platelets 277; Potassium 4.7; Sodium 138  Recent Lipid Panel No results found for: CHOL, TRIG, HDL, CHOLHDL, VLDL, LDLCALC, LDLDIRECT  Physical Exam:    VS:  BP 108/72   Pulse 78   Ht 6\' 2"  (1.88 m)   Wt 284 lb (128.8 kg)   SpO2 97%   BMI 36.46 kg/m     Wt Readings from Last 3 Encounters:  05/04/19 284 lb (128.8 kg)  05/03/19 283 lb (128.4 kg)  02/14/19 272 lb (123.4 kg)     GEN:  Well nourished, well developed in no acute distress HEENT: Normal NECK: No JVD; No carotid bruits LYMPHATICS: No lymphadenopathy CARDIAC: RRR, no murmurs, no rubs, no gallops RESPIRATORY:  Clear to auscultation without rales, wheezing or rhonchi  ABDOMEN: Soft, non-tender, non-distended MUSCULOSKELETAL:  No edema; No deformity  SKIN: Warm and dry LOWER EXTREMITIES: no swelling NEUROLOGIC:  Alert and oriented x 3 PSYCHIATRIC:  Normal affect   ASSESSMENT:    1. Benign essential hypertension   2. Dilated cardiomyopathy ([place])   3. New onset left bundle branch block (LBBB)   4. 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    PLAN:     In order of problems listed above:  1. Benign essential hypertension blood pressure actually is on the lower side.  Asking to discontinue 20 mg of furosemide. 2. Dilated cardiomyopathy,  on guidelines directed medical therapy maximal therapy.  I wish to be able to increase dose of medication or what his blood pressure being on the lower side of no room to do that.  Therefore, I will stop his furosemide 20 mg daily and see if he can tolerate higher dosages of medications.  3.  Left bundle branch block.  Chronic 4.  Episode of dizziness with some orthostasis.  Will discontinue his furosemide.  Asked him also to check his blood pressure readings at those episodes. 5.  CRT-D in place.  I did review interrogation of normal function about 6 years left of the device.  I will ask him today to have Chem-7 done make sure that he is kidney function as well as potassium is an acceptable level.  Medication Adjustments/Labs and Tests Ordered: Current medicines are reviewed at length with the patient today.  Concerns regarding medicines are outlined above.  Orders Placed This Encounter  Procedures  . Basic Metabolic Panel (BMET)   Medication changes: No orders of the defined types were placed in this encounter.   Signed, Park Liter, MD, [facility] 05/04/2019 11:24 AM    [place]

## 2019-05-04 NOTE — Patient Instructions (Signed)
Medication Instructions:  Stop Furosemide  *If you need a refill on your cardiac medications before your next appointment, please call your pharmacy*   Lab Work: BMP If you have labs (blood work) drawn today and your tests are completely normal, you will receive your results only by: Marland Kitchen MyChart Message (if you have MyChart) OR . A paper copy in the mail If you have any lab test that is abnormal or we need to change your treatment, we will call you to review the results.   Testing/Procedures: None   Follow-Up: At [facility], you and your health needs are our priority.  As part of our continuing mission to provide you with exceptional heart care, we have created designated Provider Care Teams.  These Care Teams include your primary Cardiologist (physician) and Advanced Practice Providers (APPs -  Physician Assistants and Nurse Practitioners) who all work together to provide you with the care you need, when you need it.  We recommend signing up for the patient portal called "MyChart".  Sign up information is provided on this After Visit Summary.  MyChart is used to connect with patients for Virtual Visits (Telemedicine).  Patients are able to view lab/test results, encounter notes, upcoming appointments, etc.  Non-urgent messages can be sent to your provider as well.   To learn more about what you can do with MyChart, go to [email or web address].    Your next appointment:   5 month(s)  The format for your next appointment:   In Person  Provider:   Jenne Campus, MD   Other Instructions

## 2019-05-05 LAB — BASIC METABOLIC PANEL
BUN/Creatinine Ratio: 14 (ref 10–24)
BUN: 16 mg/dL (ref 8–27)
CO2: 23 mmol/L (ref 20–29)
Calcium: 9.4 mg/dL (ref 8.6–10.2)
Chloride: 102 mmol/L (ref 96–106)
Creatinine, Ser: 1.11 mg/dL (ref 0.76–1.27)
GFR calc Af Amer: 82 mL/min/{1.73_m2} (ref >59)
GFR calc non Af Amer: 71 mL/min/{1.73_m2} (ref >59)
Glucose: 103 mg/dL — ABNORMAL HIGH (ref 65–99)
Potassium: 4.4 mmol/L (ref 3.5–5.2)
Sodium: 141 mmol/L (ref 134–144)

## 2019-05-06 ENCOUNTER — Telehealth: Payer: Self-pay | Admitting: Emergency Medicine

## 2019-05-06 NOTE — Telephone Encounter (Signed)
Left message for patient to return call regarding lab results.  

## 2019-06-15 ENCOUNTER — Telehealth: Payer: Self-pay | Admitting: Cardiology

## 2019-06-15 NOTE — Telephone Encounter (Signed)
New message   Patient needs prior auth for ENTRESTO 49-51 MG from [facility]

## 2019-06-16 ENCOUNTER — Other Ambulatory Visit: Payer: Self-pay | Admitting: Cardiology

## 2019-06-16 NOTE — Telephone Encounter (Signed)
Let the patient know I spoke to [place] with [employer], pt does not need prior authorization for the [facility].  The medication was being refilled too soon.  Pt needs to refill the medication on July 11, 2019.   Last refill was 05-04-19. He states the medicine is on auto refill and that may be why it was being requested so soon because he is taking the medication like he has been directed.

## 2019-06-17 NOTE — Telephone Encounter (Signed)
Refill request for Entresto 24-26 denied due to dose change and refill in March for 90 days.

## 2019-07-15 ENCOUNTER — Ambulatory Visit (INDEPENDENT_AMBULATORY_CARE_PROVIDER_SITE_OTHER): Payer: BC Managed Care – PPO | Admitting: *Deleted

## 2019-07-15 DIAGNOSIS — I420 Dilated cardiomyopathy: Secondary | ICD-10-CM

## 2019-07-18 LAB — CUP PACEART REMOTE DEVICE CHECK
Battery Remaining Longevity: 76 mo
Battery Remaining Percentage: 90 %
Battery Voltage: 2.96 V
Brady Statistic AP VP Percent: 5.4 %
Brady Statistic AP VS Percent: 1 %
Brady Statistic AS VP Percent: 93 %
Brady Statistic AS VS Percent: 1 %
Brady Statistic RA Percent Paced: 4.8 %
Date Time Interrogation Session: 20210613080112
HighPow Impedance: 72 Ohm
Implantable Lead Implant Date: 20201210
Implantable Lead Implant Date: 20201210
Implantable Lead Implant Date: 20201210
Implantable Lead Location: 753858
Implantable Lead Location: 753859
Implantable Lead Location: 753860
Implantable Pulse Generator Implant Date: 20201210
Lead Channel Impedance Value: 440 Ohm
Lead Channel Impedance Value: 500 Ohm
Lead Channel Impedance Value: 540 Ohm
Lead Channel Pacing Threshold Amplitude: 0.5 V
Lead Channel Pacing Threshold Amplitude: 0.75 V
Lead Channel Pacing Threshold Amplitude: 0.75 V
Lead Channel Pacing Threshold Pulse Width: 0.5 ms
Lead Channel Pacing Threshold Pulse Width: 0.5 ms
Lead Channel Pacing Threshold Pulse Width: 0.5 ms
Lead Channel Sensing Intrinsic Amplitude: 12 mV
Lead Channel Sensing Intrinsic Amplitude: 5 mV
Lead Channel Setting Pacing Amplitude: 2 V
Lead Channel Setting Pacing Amplitude: 2.5 V
Lead Channel Setting Pacing Amplitude: 2.5 V
Lead Channel Setting Pacing Pulse Width: 0.5 ms
Lead Channel Setting Pacing Pulse Width: 0.5 ms
Lead Channel Setting Sensing Sensitivity: 0.5 mV
Pulse Gen Serial Number: 111014317

## 2019-07-18 NOTE — Progress Notes (Signed)
Remote ICD transmission.   

## 2019-08-02 ENCOUNTER — Telehealth: Payer: Self-pay | Admitting: Emergency Medicine

## 2019-08-02 NOTE — Telephone Encounter (Signed)
Left message for patient to return call to discuss entresto coverage.

## 2019-08-03 NOTE — Telephone Encounter (Signed)
Called patient and informed him that we got the denial letter for entresto from his insurance. He verbally understood, however since he has Pharmacist, community I informed him that the co pay card should work. He will pick up from the [place] office this pm

## 2019-08-16 NOTE — Telephone Encounter (Signed)
Follow Up  Patient is calling in to follow up about prior authorization for Entresto. States that [phone] can be called by the office to assist with getting the medication authorized. Please give patient a call back to discuss.

## 2019-08-16 NOTE — Telephone Encounter (Signed)
Gina with [facility] is calling to follow up in regards to prior authorization for   ENTRESTO 49-51 MG  Medication. Please return call to discuss.

## 2019-08-16 NOTE — Telephone Encounter (Signed)
Called patient, informed him that his entresto has been approved. No further questions.

## 2019-08-16 NOTE — Telephone Encounter (Signed)
Called patient informed him that per the pharmacy they cannot accept the copay card because his insurance denied the original prior auth. Patient plans to come by [place] office and pick up a patient assistance form I will also leave samples for him if we have them since he is almost out. No further questions.

## 2019-08-16 NOTE — Telephone Encounter (Signed)
Called the PA line. Redid the PA on the phone. Patient has been approved. Will inform patient. [id number] (Reference number).

## 2019-08-17 ENCOUNTER — Other Ambulatory Visit: Payer: Self-pay

## 2019-08-17 MED ORDER — ENTRESTO 49-51 MG PO TABS: 1.0000 | ORAL_TABLET | Freq: Two times a day (BID) | ORAL | 1 refills | Status: DC

## 2019-08-17 NOTE — Telephone Encounter (Signed)
Refill sent to [facility] for [facility] 49-51

## 2019-08-25 DIAGNOSIS — H10402 Unspecified chronic conjunctivitis, left eye: Secondary | ICD-10-CM

## 2019-08-25 HISTORY — DX: Unspecified chronic conjunctivitis, left eye: H10.402

## 2019-09-03 ENCOUNTER — Other Ambulatory Visit: Payer: Self-pay | Admitting: Cardiology

## 2019-09-12 DIAGNOSIS — I5022 Chronic systolic (congestive) heart failure: Secondary | ICD-10-CM

## 2019-09-12 HISTORY — DX: Chronic systolic (congestive) heart failure: I50.22

## 2019-10-14 ENCOUNTER — Ambulatory Visit (INDEPENDENT_AMBULATORY_CARE_PROVIDER_SITE_OTHER): Payer: BC Managed Care – PPO | Admitting: *Deleted

## 2019-10-14 DIAGNOSIS — I420 Dilated cardiomyopathy: Secondary | ICD-10-CM

## 2019-10-14 LAB — CUP PACEART REMOTE DEVICE CHECK
Battery Remaining Longevity: 72 mo
Battery Remaining Percentage: 86 %
Battery Voltage: 2.96 V
Brady Statistic AP VP Percent: 7.1 %
Brady Statistic AP VS Percent: 1 %
Brady Statistic AS VP Percent: 92 %
Brady Statistic AS VS Percent: 1 %
Brady Statistic RA Percent Paced: 6.7 %
Date Time Interrogation Session: 20210910133320
HighPow Impedance: 75 Ohm
Implantable Lead Implant Date: 20201210
Implantable Lead Implant Date: 20201210
Implantable Lead Implant Date: 20201210
Implantable Lead Location: 753858
Implantable Lead Location: 753859
Implantable Lead Location: 753860
Implantable Pulse Generator Implant Date: 20201210
Lead Channel Impedance Value: 480 Ohm
Lead Channel Impedance Value: 490 Ohm
Lead Channel Impedance Value: 510 Ohm
Lead Channel Pacing Threshold Amplitude: 0.5 V
Lead Channel Pacing Threshold Amplitude: 0.75 V
Lead Channel Pacing Threshold Amplitude: 0.75 V
Lead Channel Pacing Threshold Pulse Width: 0.5 ms
Lead Channel Pacing Threshold Pulse Width: 0.5 ms
Lead Channel Pacing Threshold Pulse Width: 0.5 ms
Lead Channel Sensing Intrinsic Amplitude: 12 mV
Lead Channel Sensing Intrinsic Amplitude: 5 mV
Lead Channel Setting Pacing Amplitude: 2 V
Lead Channel Setting Pacing Amplitude: 2.5 V
Lead Channel Setting Pacing Amplitude: 2.5 V
Lead Channel Setting Pacing Pulse Width: 0.5 ms
Lead Channel Setting Pacing Pulse Width: 0.5 ms
Lead Channel Setting Sensing Sensitivity: 0.5 mV
Pulse Gen Serial Number: 111014317

## 2019-10-17 NOTE — Progress Notes (Signed)
Remote ICD transmission.   

## 2019-11-01 ENCOUNTER — Other Ambulatory Visit: Payer: Self-pay

## 2019-11-01 ENCOUNTER — Encounter: Payer: Self-pay | Admitting: Cardiology

## 2019-11-01 ENCOUNTER — Ambulatory Visit: Payer: BC Managed Care – PPO | Admitting: Cardiology

## 2019-11-01 VITALS — BP 104/84 | HR 84 | Ht 74.0 in | Wt 281.0 lb

## 2019-11-01 DIAGNOSIS — Z95810 Presence of automatic (implantable) cardiac defibrillator: Secondary | ICD-10-CM | POA: Diagnosis not present

## 2019-11-01 DIAGNOSIS — I10 Essential (primary) hypertension: Secondary | ICD-10-CM | POA: Diagnosis not present

## 2019-11-01 DIAGNOSIS — I420 Dilated cardiomyopathy: Secondary | ICD-10-CM

## 2019-11-01 DIAGNOSIS — R7303 Prediabetes: Secondary | ICD-10-CM | POA: Diagnosis not present

## 2019-11-01 NOTE — Patient Instructions (Signed)
Medication Instructions:  °Your physician recommends that you continue on your current medications as directed. Please refer to the Current Medication list given to you today. ° °*If you need a refill on your cardiac medications before your next appointment, please call your pharmacy* ° ° °Lab Work: °None. ° °If you have labs (blood work) drawn today and your tests are completely normal, you will receive your results only by: °• MyChart Message (if you have MyChart) OR °• A paper copy in the mail °If you have any lab test that is abnormal or we need to change your treatment, we will call you to review the results. ° ° °Testing/Procedures: °Your physician has requested that you have an echocardiogram. Echocardiography is a painless test that uses sound waves to create images of your heart. It provides your doctor with information about the size and shape of your heart and how well your heart’s chambers and valves are working. This procedure takes approximately one hour. There are no restrictions for this procedure. ° ° ° ° °Follow-Up: °At [facility], you and your health needs are our priority.  As part of our continuing mission to provide you with exceptional heart care, we have created designated Provider Care Teams.  These Care Teams include your primary Cardiologist (physician) and Advanced Practice Providers (APPs -  Physician Assistants and Nurse Practitioners) who all work together to provide you with the care you need, when you need it. ° °We recommend signing up for the patient portal called "MyChart".  Sign up information is provided on this After Visit Summary.  MyChart is used to connect with patients for Virtual Visits (Telemedicine).  Patients are able to view lab/test results, encounter notes, upcoming appointments, etc.  Non-urgent messages can be sent to your provider as well.   °To learn more about what you can do with MyChart, go to [email or web address].   ° °Your next appointment:   °6  month(s) ° °The format for your next appointment:   °In Person ° °Provider:   °Robert Krasowski, MD ° ° °Other Instructions ° ° °Echocardiogram °An echocardiogram is a procedure that uses painless sound waves (ultrasound) to produce an image of the heart. Images from an echocardiogram can provide important information about: °· Signs of coronary artery disease (CAD). °· Aneurysm detection. An aneurysm is a weak or damaged part of an artery wall that bulges out from the normal force of blood pumping through the body. °· Heart size and shape. Changes in the size or shape of the heart can be associated with certain conditions, including heart failure, aneurysm, and CAD. °· Heart muscle function. °· Heart valve function. °· Signs of a past heart attack. °· Fluid buildup around the heart. °· Thickening of the heart muscle. °· A tumor or infectious growth around the heart valves. °Tell a health care provider about: °· Any allergies you have. °· All medicines you are taking, including vitamins, herbs, eye drops, creams, and over-the-counter medicines. °· Any blood disorders you have. °· Any surgeries you have had. °· Any medical conditions you have. °· Whether you are pregnant or may be pregnant. °What are the risks? °Generally, this is a safe procedure. However, problems may occur, including: °· Allergic reaction to dye (contrast) that may be used during the procedure. °What happens before the procedure? °No specific preparation is needed. You may eat and drink normally. °What happens during the procedure? ° °· An IV tube may be inserted into one of your veins. °· You may   receive contrast through this tube. A contrast is an injection that improves the quality of the pictures from your heart. °· A gel will be applied to your chest. °· A wand-like tool (transducer) will be moved over your chest. The gel will help to transmit the sound waves from the transducer. °· The sound waves will harmlessly bounce off of your heart to  allow the heart images to be captured in real-time motion. The images will be recorded on a computer. °The procedure may vary among health care providers and hospitals. °What happens after the procedure? °· You may return to your normal, everyday life, including diet, activities, and medicines, unless your health care provider tells you not to do that. °Summary °· An echocardiogram is a procedure that uses painless sound waves (ultrasound) to produce an image of the heart. °· Images from an echocardiogram can provide important information about the size and shape of your heart, heart muscle function, heart valve function, and fluid buildup around your heart. °· You do not need to do anything to prepare before this procedure. You may eat and drink normally. °· After the echocardiogram is completed, you may return to your normal, everyday life, unless your health care provider tells you not to do that. °This information is not intended to replace advice given to you by your health care provider. Make sure you discuss any questions you have with your health care provider. °Document Revised: 05/13/2018 Document Reviewed: 02/23/2016 °Elsevier Patient Education © 2020 [employer] ° ° °

## 2019-11-01 NOTE — Addendum Note (Signed)
Addended by: Senaida Ores on: 11/01/2019 02:48 PM   Modules accepted: Orders

## 2019-11-01 NOTE — Progress Notes (Signed)
Cardiology Office Note:    Date:  11/01/2019   ID:  Raymond Alvarez, DOB 08-Mar-1955, MRN [id number]  PCP:  Raina Mina., MD  Cardiologist:  Jenne Campus, MD    Referring MD: Raina Mina., MD   Chief Complaint  Patient presents with  . Follow-up  doing well   History of Present Illness:    Raymond Alvarez is a 64 y.o. male with past medical history significant for nonischemic cardiomyopathy, status post BiV ICD implant in December, status post COVID-19 infection, essential hypertension.  Comes today 2 months for follow-up.  He said he got good days and bad days.  He does not like it if he is in a hot environment he been complaining of having some shortness of breath.  There is some swelling of lower extremities but he managed this by using some elastic stockings.  Denies have any chest pain tightness squeezing pressure burning chest no palpitations no discharges from the defibrillator.  Past Medical History:  Diagnosis Date  . Anemia   . Anxiety   . Anxiety disorder 03/20/2015  . Asthma   . Benign essential hypertension 03/20/2015   Last Assessment & Plan:  Relevant Hx: Course: Daily Update: Today's Plan:this appears stable overall  Electronically signed by: Baldemar Friday, [place] 03/20/15 1509  Last Assessment & Plan:  Formatting of this note might be different from the original. Relevant Hx: Course: Daily Update: Today's Plan:this appears stable overall  Electronically signed by: Baldemar Friday, [place] 03/20/15   . Chronic kidney disease   . Class 2 severe obesity due to excess calories with serious comorbidity and body mass index (BMI) of 35.0 to 35.9 in adult ([place]) 03/20/2015  . Colon polyp   . Depression   . Dilated cardiomyopathy ([place]) 07/15/2018   Ejection fraction 30 to 35% based on echo in June 2020  Formatting of this note might be different from the original. Ejection fraction 30 to 35% based on echo in June 2020  . Diverticulitis   . Dyspnea on exertion  06/25/2018  . Gastroesophageal reflux disease without esophagitis 07/31/2016  . GERD (gastroesophageal reflux disease)   . Gout   . History of 2019 novel coronavirus disease (COVID-19) 05/12/2018  . Hypertension   . Hyponatremia 05/11/2018  . 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 Relevant Hx: Course: Daily Update: Today's Plan:this appears stable overall, will get kenalog to he  . Iron deficiency anemia due to chronic blood loss 01/07/2017  . Mild episode of recurrent major depressive disorder ([place]) 06/17/2018  . NASH (nonalcoholic steatohepatitis) 06/17/2018  . New onset left bundle branch block (LBBB) 05/27/2018  . Obstructive sleep apnea 04/25/2019  . Palpitations 03/20/2015  . Prediabetes 06/17/2018  . Prostate cancer ([place])   . Pulmonary nodule 05/12/2018  . PVC (premature ventricular contraction) 03/20/2015  . Rhabdomyolysis 05/12/2018  . Seasonal allergies   . Suspected COVID-19 virus infection 05/12/2018  . Viral gastroenteritis 05/12/2018  . Wears glasses     Past Surgical History:  Procedure Laterality Date  . APPENDECTOMY  1970  . BIV ICD INSERTION CRT-D N/A 01/13/2019   Procedure: BIV ICD INSERTION CRT-D;  Surgeon: Constance Haw, MD;  Location: [facility];  Service: Cardiovascular;  Laterality: N/A;  . COLONOSCOPY  09/24/2016   Colonic polyp status post polypectomy. Tubular adenoma.   . ESOPHAGOGASTRODUODENOSCOPY  12/10/2016   Small hiatal hernia. Mild gastritis. Gastric polyps status post polypectomy x 4   . MASS EXCISION Left 12/15/2013   Procedure: EXCISION MUCOID TUMOR LEFT INDEX DEBRIDEMENT DISTAL INTERPHALANGEAL JOINT LEFT INDEX FINGER;  Surgeon: Daryll Brod, MD;   Location: [facility];  Service: Orthopedics;  Laterality: Left;  . PROSTATECTOMY  2015  . TONSILLECTOMY      Current Medications: Current Meds  Medication Sig  . Albuterol Sulfate (PROAIR RESPICLICK) 035 (90 Base) MCG/ACT AEPB Inhale 1-2 puffs into the lungs every 6 (six) hours as needed (wheezing/shortness of breath).  Marland Kitchen allopurinol (ZYLOPRIM) 300 MG tablet Take 300 mg by mouth daily.  . Black Pepper-Turmeric (TURMERIC COMPLEX/BLACK PEPPER) 3-500 MG CAPS Take 500 mg by mouth daily.  . carvedilol (COREG) 12.5 MG tablet TAKE 1 TABLET(12.5 MG) BY MOUTH TWICE DAILY  . cyanocobalamin (,VITAMIN B-12,) 1000 MCG/ML injection Inject 1,000 mcg into the muscle every 30 (thirty) days.  . diclofenac Sodium (VOLTAREN) 1 % GEL Apply 1 application topically daily. Applied to right knee  . dicyclomine (BENTYL) 10 MG capsule Take 1 capsule (10 mg total) by mouth 4 (four) times daily -  before meals and at bedtime.  . ferrous sulfate 324 MG TBEC Take 324 mg by mouth every Monday, Wednesday, and Friday.  . fluticasone (FLONASE) 50 MCG/ACT nasal spray Place 1 spray into both nostrils daily.  . Ginger, Zingiber officinalis, (GINGER ROOT) 550 MG CAPS Take 550 mg by mouth daily.  Marland Kitchen GNP GARLIC EXTRACT PO Take 0,093 mg by mouth 2 (two) times daily.  . hyoscyamine (LEVSIN SL) 0.125 MG SL tablet Place 1 tablet (0.125 mg total) under the tongue every 4 (four) hours as needed. (Patient taking differently: Place 0.125 mg under the tongue every 4 (four) hours as needed for cramping. )  . indomethacin (INDOCIN) 25 MG capsule Take 25 mg by mouth 2 (two) times daily with a meal.  . LORazepam (ATIVAN) 1 MG tablet Take 1 mg by mouth daily as needed for anxiety.  . methocarbamol (ROBAXIN) 500 MG tablet Take 500 mg by mouth 4 (four) times daily as needed for muscle spasms.  Marland Kitchen olopatadine (PATADAY) 0.1 % ophthalmic solution Place 1 drop into both eyes daily.  Marland Kitchen omeprazole (PRILOSEC) 20 MG capsule Take 20 mg by mouth  daily.  . ondansetron (ZOFRAN) 4 MG tablet Take 4 mg by mouth every 8 (eight) hours as needed.  Marland Kitchen OVER THE COUNTER MEDICATION Take 1 capsule by mouth 2 (two) times daily. Parsley Leaves  . Probiotic Product (PROBIOTIC PO) Take 1 capsule by mouth daily.  Marland Kitchen pyridOXINE (VITAMIN B-6) 100 MG tablet Take 200 mg by mouth daily.  . sacubitril-valsartan (ENTRESTO) 49-51 MG Take 1 tablet by mouth 2 (two) times daily.  Marland Kitchen spironolactone (ALDACTONE) 25 MG tablet Take 25 mg by mouth daily.   . Turmeric 500 MG TABS Take 500 mg by mouth daily.  . vitamin C (VITAMIN C) 500 MG tablet Take 1 tablet (500 mg total) by mouth daily.  Marland Kitchen vortioxetine HBr (TRINTELLIX) 20 MG TABS tablet Take 20 mg by mouth every evening.   . zinc sulfate 220 (50 Zn) MG capsule Take 1 capsule (220 mg total) by mouth daily.     Allergies:   Other and Sulfa antibiotics   Social History   Socioeconomic History  . Marital status: Married  Spouse name: Not on file  . Number of children: 2  . Years of education: Not on file  . Highest education level: Not on file  Occupational History  . Occupation: Sport and exercise psychologist  Tobacco Use  . Smoking status: Never Smoker  . Smokeless tobacco: Never Used  Vaping Use  . Vaping Use: Never used  Substance and Sexual Activity  . Alcohol use: Yes    Comment: occ  . Drug use: No  . Sexual activity: Not on file  Other Topics Concern  . Not on file  Social History Narrative  . Not on file   Social Determinants of Health   Financial Resource Strain:   . Difficulty of Paying Living Expenses: Not on file  Food Insecurity:   . Worried About Charity fundraiser in the Last Year: Not on file  . Ran Out of Food in the Last Year: Not on file  Transportation Needs:   . Lack of Transportation (Medical): Not on file  . Lack of Transportation (Non-Medical): Not on file  Physical Activity:   . Days of Exercise per Week: Not on file  . Minutes of Exercise per Session: Not on file  Stress:    . Feeling of Stress : Not on file  Social Connections:   . Frequency of Communication with Friends and Family: Not on file  . Frequency of Social Gatherings with Friends and Family: Not on file  . Attends Religious Services: Not on file  . Active Member of Clubs or Organizations: Not on file  . Attends Archivist Meetings: Not on file  . Marital Status: Not on file     Family History: The patient's family history includes Breast cancer in his mother; Prostate cancer in his father and maternal grandfather. There is no history of Colon cancer. ROS:   Please see the history of present illness.    All 14 point review of systems negative except as described per history of present illness  EKGs/Labs/Other Studies Reviewed:      Recent Labs: 12/27/2018: Hemoglobin 13.7; Platelets 277 05/04/2019: BUN 16; Creatinine, Ser 1.11; Potassium 4.4; Sodium 141  Recent Lipid Panel No results found for: CHOL, TRIG, HDL, CHOLHDL, VLDL, LDLCALC, LDLDIRECT  Physical Exam:    VS:  BP 104/84 (BP Location: Left Arm, Patient Position: Sitting, Cuff Size: Large)   Pulse 84   Ht 6\' 2"  (1.88 m)   Wt 281 lb (127.5 kg)   SpO2 97%   BMI 36.08 kg/m     Wt Readings from Last 3 Encounters:  11/01/19 281 lb (127.5 kg)  05/04/19 284 lb (128.8 kg)  05/03/19 283 lb (128.4 kg)     GEN:  Well nourished, well developed in no acute distress HEENT: Normal NECK: No JVD; No carotid bruits LYMPHATICS: No lymphadenopathy CARDIAC: RRR, no murmurs, no rubs, no gallops RESPIRATORY:  Clear to auscultation without rales, wheezing or rhonchi  ABDOMEN: Soft, non-tender, non-distended MUSCULOSKELETAL:  No edema; No deformity  SKIN: Warm and dry LOWER EXTREMITIES: no swelling NEUROLOGIC:  Alert and oriented x 3 PSYCHIATRIC:  Normal affect   ASSESSMENT:    1. Dilated cardiomyopathy ([place])   2. Benign essential hypertension   3. 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 Prediabetes     PLAN:    In order of problems listed above:  1. Dilated cardiomyopathy on appropriate medication which I will continue.I did review his lab work test done by her primary care physician which showed creatinine  of 1.17, potassium 4.3.  We will continue present management.  I also did review his lipid panel from last February which showed LDL of 107 and HDL of 37. 2. Benign essential hypertension is not a problem anymore issue is his low blood pressure. 3. CRT device: Followed by our EP team.  Doing well with normal function. 4. Prediabetes: That being followed by primary care physician. 5. I will schedule him to have echocardiogram to reassess left ventricle ejection fraction.  I encouraged him to be a little more active.   Medication Adjustments/Labs and Tests Ordered: Current medicines are reviewed at length with the patient today.  Concerns regarding medicines are outlined above.  No orders of the defined types were placed in this encounter.  Medication changes: No orders of the defined types were placed in this encounter.   Signed, Park Liter, MD, [facility] 11/01/2019 2:34 PM    [place]

## 2019-11-10 DIAGNOSIS — N529 Male erectile dysfunction, unspecified: Secondary | ICD-10-CM

## 2019-11-10 DIAGNOSIS — Z8546 Personal history of malignant neoplasm of prostate: Secondary | ICD-10-CM

## 2019-11-10 HISTORY — DX: Personal history of malignant neoplasm of prostate: Z85.46

## 2019-11-10 HISTORY — DX: Male erectile dysfunction, unspecified: N52.9

## 2019-11-22 DIAGNOSIS — M19019 Primary osteoarthritis, unspecified shoulder: Secondary | ICD-10-CM

## 2019-11-22 HISTORY — DX: Primary osteoarthritis, unspecified shoulder: M19.019

## 2019-11-23 ENCOUNTER — Other Ambulatory Visit: Payer: Self-pay

## 2019-11-23 ENCOUNTER — Ambulatory Visit (INDEPENDENT_AMBULATORY_CARE_PROVIDER_SITE_OTHER): Payer: BC Managed Care – PPO

## 2019-11-23 DIAGNOSIS — I420 Dilated cardiomyopathy: Secondary | ICD-10-CM

## 2019-11-23 NOTE — Progress Notes (Signed)
Complete echocardiogram performed.  Jimmy Ramsey Guadamuz RDCS, RVT  

## 2019-12-07 LAB — ECHOCARDIOGRAM COMPLETE
Area-P 1/2: 3.48 cm2
Calc EF: 47.6 %
S' Lateral: 4 cm
Single Plane A2C EF: 46.7 %
Single Plane A4C EF: 50.1 %

## 2019-12-19 ENCOUNTER — Other Ambulatory Visit: Payer: Self-pay | Admitting: Cardiology

## 2020-01-13 ENCOUNTER — Ambulatory Visit (INDEPENDENT_AMBULATORY_CARE_PROVIDER_SITE_OTHER): Payer: BC Managed Care – PPO

## 2020-01-13 DIAGNOSIS — I428 Other cardiomyopathies: Secondary | ICD-10-CM

## 2020-01-16 LAB — CUP PACEART REMOTE DEVICE CHECK
Battery Remaining Longevity: 70 mo
Battery Remaining Percentage: 83 %
Battery Voltage: 2.96 V
Brady Statistic AP VP Percent: 9.7 %
Brady Statistic AP VS Percent: 1 %
Brady Statistic AS VP Percent: 90 %
Brady Statistic AS VS Percent: 1 %
Brady Statistic RA Percent Paced: 9.5 %
Date Time Interrogation Session: 20211210020241
HighPow Impedance: 75 Ohm
Implantable Lead Implant Date: 20201210
Implantable Lead Implant Date: 20201210
Implantable Lead Implant Date: 20201210
Implantable Lead Location: 753858
Implantable Lead Location: 753859
Implantable Lead Location: 753860
Implantable Pulse Generator Implant Date: 20201210
Lead Channel Impedance Value: 450 Ohm
Lead Channel Impedance Value: 450 Ohm
Lead Channel Impedance Value: 500 Ohm
Lead Channel Pacing Threshold Amplitude: 0.5 V
Lead Channel Pacing Threshold Amplitude: 0.75 V
Lead Channel Pacing Threshold Amplitude: 0.75 V
Lead Channel Pacing Threshold Pulse Width: 0.5 ms
Lead Channel Pacing Threshold Pulse Width: 0.5 ms
Lead Channel Pacing Threshold Pulse Width: 0.5 ms
Lead Channel Sensing Intrinsic Amplitude: 4.8 mV
Lead Channel Sensing Intrinsic Amplitude: 5 mV
Lead Channel Setting Pacing Amplitude: 2 V
Lead Channel Setting Pacing Amplitude: 2.5 V
Lead Channel Setting Pacing Amplitude: 2.5 V
Lead Channel Setting Pacing Pulse Width: 0.5 ms
Lead Channel Setting Pacing Pulse Width: 0.5 ms
Lead Channel Setting Sensing Sensitivity: 0.5 mV
Pulse Gen Serial Number: 111014317

## 2020-01-26 NOTE — Progress Notes (Signed)
Remote ICD transmission.   

## 2020-04-13 ENCOUNTER — Ambulatory Visit (INDEPENDENT_AMBULATORY_CARE_PROVIDER_SITE_OTHER): Payer: BC Managed Care – PPO

## 2020-04-13 DIAGNOSIS — I420 Dilated cardiomyopathy: Secondary | ICD-10-CM

## 2020-04-16 LAB — CUP PACEART REMOTE DEVICE CHECK
Battery Remaining Longevity: 67 mo
Battery Remaining Percentage: 81 %
Battery Voltage: 2.96 V
Brady Statistic AP VP Percent: 10 %
Brady Statistic AP VS Percent: 1 %
Brady Statistic AS VP Percent: 89 %
Brady Statistic AS VS Percent: 1 %
Brady Statistic RA Percent Paced: 10 %
Date Time Interrogation Session: 20220311080035
HighPow Impedance: 79 Ohm
Implantable Lead Implant Date: 20201210
Implantable Lead Implant Date: 20201210
Implantable Lead Implant Date: 20201210
Implantable Lead Location: 753858
Implantable Lead Location: 753859
Implantable Lead Location: 753860
Implantable Pulse Generator Implant Date: 20201210
Lead Channel Impedance Value: 450 Ohm
Lead Channel Impedance Value: 490 Ohm
Lead Channel Impedance Value: 500 Ohm
Lead Channel Pacing Threshold Amplitude: 0.5 V
Lead Channel Pacing Threshold Amplitude: 0.75 V
Lead Channel Pacing Threshold Amplitude: 0.75 V
Lead Channel Pacing Threshold Pulse Width: 0.5 ms
Lead Channel Pacing Threshold Pulse Width: 0.5 ms
Lead Channel Pacing Threshold Pulse Width: 0.5 ms
Lead Channel Sensing Intrinsic Amplitude: 4.8 mV
Lead Channel Sensing Intrinsic Amplitude: 5 mV
Lead Channel Setting Pacing Amplitude: 2 V
Lead Channel Setting Pacing Amplitude: 2.5 V
Lead Channel Setting Pacing Amplitude: 2.5 V
Lead Channel Setting Pacing Pulse Width: 0.5 ms
Lead Channel Setting Pacing Pulse Width: 0.5 ms
Lead Channel Setting Sensing Sensitivity: 0.5 mV
Pulse Gen Serial Number: 111014317

## 2020-04-18 ENCOUNTER — Other Ambulatory Visit: Payer: Self-pay | Admitting: Cardiology

## 2020-04-23 ENCOUNTER — Encounter: Payer: Self-pay | Admitting: Cardiology

## 2020-04-23 ENCOUNTER — Other Ambulatory Visit: Payer: Self-pay

## 2020-04-23 ENCOUNTER — Ambulatory Visit (INDEPENDENT_AMBULATORY_CARE_PROVIDER_SITE_OTHER): Payer: BC Managed Care – PPO | Admitting: Cardiology

## 2020-04-23 VITALS — BP 110/84 | HR 83 | Ht 74.0 in | Wt 281.2 lb

## 2020-04-23 DIAGNOSIS — I428 Other cardiomyopathies: Secondary | ICD-10-CM

## 2020-04-23 DIAGNOSIS — I420 Dilated cardiomyopathy: Secondary | ICD-10-CM | POA: Diagnosis not present

## 2020-04-23 NOTE — Progress Notes (Signed)
Electrophysiology Office Note   Date:  04/23/2020   ID:  Raymond Alvarez, DOB 1955/06/26, MRN [id number]  PCP:  Raina Mina., MD  Cardiologist: Agustin Cree Primary Electrophysiologist:  Ardeth Repetto Meredith Leeds, MD    Chief Complaint: CHF   History of Present Illness: Raymond Alvarez is a 65 y.o. male who is being seen today for the evaluation of CHF at the request of Jenne Campus. Presenting today for electrophysiology evaluation.  He has a history of chronic systolic heart failure due to nonischemic cardiomyopathy, CKD, and hypertension.  He has been on optimal medical therapy.  He is now status post [place] CRT-D implanted 01/13/2019.  Today, denies symptoms of palpitations, chest pain, shortness of breath, orthopnea, PND, lower extremity edema, claudication, dizziness, presyncope, syncope, bleeding, or neurologic sequela. The patient is tolerating medications without difficulties.  Since last being seen he has done well.  He has no chest pain or shortness of breath.  He is doing much better now that his CRT-D is in place.  His ejection fraction has also improved.   Past Medical History:  Diagnosis Date  . Anemia   . Anxiety   . Anxiety disorder 03/20/2015  . Asthma   . Benign essential hypertension 03/20/2015   Last Assessment & Plan:  Relevant Hx: Course: Daily Update: Today's Plan:this appears stable overall  Electronically signed by: Baldemar Friday, [place] 03/20/15 1509  Last Assessment & Plan:  Formatting of this note might be different from the original. Relevant Hx: Course: Daily Update: Today's Plan:this appears stable overall  Electronically signed by: Baldemar Friday, [place] 03/20/15   . Chronic kidney disease   . Class 2 severe obesity due to excess calories with serious comorbidity and body mass index (BMI) of 35.0 to 35.9 in adult ([place]) 03/20/2015  . Colon polyp   . Depression   . Dilated cardiomyopathy ([place]) 07/15/2018   Ejection fraction 30 to 35% based on  echo in June 2020  Formatting of this note might be different from the original. Ejection fraction 30 to 35% based on echo in June 2020  . Diverticulitis   . Dyspnea on exertion 06/25/2018  . Gastroesophageal reflux disease without esophagitis 07/31/2016  . GERD (gastroesophageal reflux disease)   . Gout   . History of 2019 novel coronavirus disease (COVID-19) 05/12/2018  . Hypertension   . Hyponatremia 05/11/2018  . 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 Relevant Hx: Course: Daily Update: Today's Plan:this appears stable overall, Simonne Boulos get kenalog to he  . Iron deficiency anemia due to chronic blood loss 01/07/2017  . Mild episode of recurrent major depressive disorder ([place]) 06/17/2018  . NASH (nonalcoholic steatohepatitis) 06/17/2018  . New onset left bundle branch block (LBBB) 05/27/2018  . Obstructive sleep apnea 04/25/2019  . Palpitations 03/20/2015  . Prediabetes 06/17/2018  . Prostate cancer ([place])   . Pulmonary nodule 05/12/2018  . PVC (premature ventricular contraction) 03/20/2015  . Rhabdomyolysis 05/12/2018  . Seasonal allergies   . Suspected COVID-19 virus infection 05/12/2018  . Viral gastroenteritis 05/12/2018  . Wears glasses    Past Surgical History:  Procedure Laterality Date  . APPENDECTOMY  1970  . BIV ICD INSERTION CRT-D N/A 01/13/2019   Procedure: BIV  ICD INSERTION CRT-D;  Surgeon: Constance Haw, MD;  Location: [facility];  Service: Cardiovascular;  Laterality: N/A;  . COLONOSCOPY  09/24/2016   Colonic polyp status post polypectomy. Tubular adenoma.   . ESOPHAGOGASTRODUODENOSCOPY  12/10/2016   Small hiatal hernia. Mild gastritis. Gastric polyps status post  polypectomy x 4   . MASS EXCISION Left 12/15/2013   Procedure: EXCISION MUCOID TUMOR LEFT INDEX DEBRIDEMENT DISTAL INTERPHALANGEAL JOINT LEFT INDEX FINGER;  Surgeon: Daryll Brod, MD;  Location: [facility];  Service: Orthopedics;  Laterality: Left;  . PROSTATECTOMY  2015  . TONSILLECTOMY       Current Outpatient Medications  Medication Sig Dispense Refill  . Albuterol Sulfate (PROAIR RESPICLICK) 619 (90 Base) MCG/ACT AEPB Inhale 1-2 puffs into the lungs every 6 (six) hours as needed (wheezing/shortness of breath).    Marland Kitchen allopurinol (ZYLOPRIM) 300 MG tablet Take 300 mg by mouth daily.    . Black Pepper-Turmeric 3-500 MG CAPS Take 500 mg by mouth daily.    . carvedilol (COREG) 12.5 MG tablet TAKE 1 TABLET(12.5 MG) BY MOUTH TWICE DAILY 180 tablet 1  . cyanocobalamin (,VITAMIN B-12,) 1000 MCG/ML injection Inject 1,000 mcg into the muscle every 30 (thirty) days.    . diclofenac Sodium (VOLTAREN) 1 % GEL Apply 1 application topically daily. Applied to right knee    . dicyclomine (BENTYL) 10 MG capsule Take 1 capsule (10 mg total) by mouth 4 (four) times daily -  before meals and at bedtime. 120 capsule 11  . ENTRESTO 49-51 MG TAKE 1 TABLET BY MOUTH TWICE DAILY 180 tablet 1  . ferrous sulfate 324 MG TBEC Take 324 mg by mouth every Monday, Wednesday, and Friday.    . fluticasone (FLONASE) 50 MCG/ACT nasal spray Place 1 spray into both nostrils daily.    . Ginger, Zingiber officinalis, (GINGER ROOT) 550 MG CAPS Take 550 mg by mouth daily.    Marland Kitchen GNP GARLIC EXTRACT PO Take 5,093 mg by mouth 2 (two) times daily.    . hyoscyamine (LEVSIN SL) 0.125 MG SL tablet Place 1 tablet (0.125 mg total) under the tongue every 4 (four) hours as needed. 120 tablet 8  . indomethacin (INDOCIN) 25 MG capsule Take 25 mg by mouth 2 (two) times daily with a meal.    . LORazepam (ATIVAN) 1 MG tablet Take 1 mg by mouth daily as needed for anxiety.    . methocarbamol (ROBAXIN) 500 MG tablet Take 500 mg by mouth 4  (four) times daily as needed for muscle spasms.    Marland Kitchen olopatadine (PATANOL) 0.1 % ophthalmic solution Place 1 drop into both eyes daily.    Marland Kitchen omeprazole (PRILOSEC) 20 MG capsule Take 20 mg by mouth daily.    . ondansetron (ZOFRAN) 4 MG tablet Take 4 mg by mouth every 8 (eight) hours as needed.    Marland Kitchen OVER THE COUNTER MEDICATION Take 1 capsule by mouth 2 (two) times daily. Parsley Leaves    . Probiotic Product (PROBIOTIC PO) Take 1 capsule by mouth daily.    Marland Kitchen pyridOXINE (VITAMIN B-6) 100 MG tablet Take 200 mg by mouth daily.    Marland Kitchen spironolactone (ALDACTONE) 25 MG tablet Take 25 mg by mouth daily.     . Turmeric 500 MG TABS Take 500 mg by mouth daily.    . vitamin C (VITAMIN C) 500 MG tablet Take 1 tablet (500 mg total) by mouth daily. 30 tablet 0  . vortioxetine HBr (TRINTELLIX) 20 MG TABS tablet Take  20 mg by mouth every evening.     . zinc sulfate 220 (50 Zn) MG capsule Take 1 capsule (220 mg total) by mouth daily. 30 capsule 0   No current facility-administered medications for this visit.    Allergies:   Other and Sulfa antibiotics   Social History:  The patient  reports that he has never smoked. He has never used smokeless tobacco. He reports current alcohol use. He reports that he does not use drugs.   Family History:  The patient's family history includes Breast cancer in his mother; Prostate cancer in his father and maternal grandfather.   ROS:  Please see the history of present illness.   Otherwise, review of systems is positive for none.   All other systems are reviewed and negative.   PHYSICAL EXAM: VS:  BP 110/84   Pulse 83   Ht 6\' 2"  (1.88 m)   Wt 281 lb 3.2 oz (127.6 kg)   SpO2 97%   BMI 36.10 kg/m  , BMI Body mass index is 36.1 kg/m. GEN: Well nourished, well developed, in no acute distress  HEENT: normal  Neck: no JVD, carotid bruits, or masses Cardiac: RRR; no murmurs, rubs, or gallops,no edema  Respiratory:  clear to auscultation bilaterally, normal work of  breathing GI: soft, nontender, nondistended, + BS MS: no deformity or atrophy  Skin: warm and dry, device site well healed Neuro:  Strength and sensation are intact Psych: euthymic mood, full affect  EKG:  EKG is ordered today. Personal review of the ekg ordered shows atrial sensed, ventricular paced, rate 83  Personal review of the device interrogation today. Results in [place]: 05/04/2019: BUN 16; Creatinine, Ser 1.11; Potassium 4.4; Sodium 141    Lipid Panel  No results found for: CHOL, TRIG, HDL, CHOLHDL, VLDL, LDLCALC, LDLDIRECT   Wt Readings from Last 3 Encounters:  04/23/20 281 lb 3.2 oz (127.6 kg)  11/01/19 281 lb (127.5 kg)  05/04/19 284 lb (128.8 kg)      Other studies Reviewed: Additional studies/ records that were reviewed today include: TTE 11/23/2019  Review of the above records today demonstrates:  1. Left ventricular ejection fraction, by estimation, is 40 to 45%. The  left ventricle has mild to moderately decreased function. The left  ventricle demonstrates global hypokinesis. The left ventricular internal  cavity size was mildly dilated. There is  mild concentric left ventricular hypertrophy. Left ventricular diastolic  parameters are consistent with Grade I diastolic dysfunction (impaired  relaxation).  2. Right ventricular systolic function is mildly reduced. The right  ventricular size is normal. There is normal pulmonary artery systolic  pressure.  3. The mitral valve is normal in structure. Trivial mitral valve  regurgitation. No evidence of mitral stenosis.  4. The aortic valve is normal in structure. Aortic valve regurgitation is  not visualized. No aortic stenosis is present.  5. There is borderline dilatation of the ascending aorta, measuring 36  mm.  6. The inferior vena cava is normal in size with greater than 50%  respiratory variability, suggesting right atrial pressure of 3 mmHg.    ASSESSMENT AND PLAN:  1.   Chronic systolic heart failure due to nonischemic cardiomyopathy: Currently on carvedilol, Entresto, Aldactone.  Status post [place] CRT-D implanted 01/13/2019.  Device functioning appropriately.  No changes at this time.    2.  Hypertension: Currently well controlled   Current medicines are reviewed at length with the patient today.   The patient does  not have concerns regarding his medicines.  The following changes were made today: None  Labs/ tests ordered today include:  Orders Placed This Encounter  Procedures  . EKG 12-Lead     Disposition:   FU with Brisa Auth 12 months  Signed, Linsi Humann Meredith Leeds, MD  04/23/2020 3:41 PM     [address] [place] [address] [phone] (office) [phone] (fax)

## 2020-04-23 NOTE — Progress Notes (Signed)
Remote ICD transmission.   

## 2020-04-27 DIAGNOSIS — J302 Other seasonal allergic rhinitis: Secondary | ICD-10-CM | POA: Insufficient documentation

## 2020-04-27 DIAGNOSIS — F32A Depression, unspecified: Secondary | ICD-10-CM | POA: Insufficient documentation

## 2020-04-27 DIAGNOSIS — Z973 Presence of spectacles and contact lenses: Secondary | ICD-10-CM | POA: Insufficient documentation

## 2020-04-27 DIAGNOSIS — N189 Chronic kidney disease, unspecified: Secondary | ICD-10-CM | POA: Insufficient documentation

## 2020-04-27 DIAGNOSIS — J45909 Unspecified asthma, uncomplicated: Secondary | ICD-10-CM | POA: Insufficient documentation

## 2020-04-27 DIAGNOSIS — K219 Gastro-esophageal reflux disease without esophagitis: Secondary | ICD-10-CM | POA: Insufficient documentation

## 2020-04-27 DIAGNOSIS — K5792 Diverticulitis of intestine, part unspecified, without perforation or abscess without bleeding: Secondary | ICD-10-CM | POA: Insufficient documentation

## 2020-04-27 DIAGNOSIS — C61 Malignant neoplasm of prostate: Secondary | ICD-10-CM | POA: Insufficient documentation

## 2020-04-27 DIAGNOSIS — M109 Gout, unspecified: Secondary | ICD-10-CM | POA: Insufficient documentation

## 2020-04-27 DIAGNOSIS — K635 Polyp of colon: Secondary | ICD-10-CM | POA: Insufficient documentation

## 2020-04-27 DIAGNOSIS — I10 Essential (primary) hypertension: Secondary | ICD-10-CM | POA: Insufficient documentation

## 2020-04-27 DIAGNOSIS — F419 Anxiety disorder, unspecified: Secondary | ICD-10-CM | POA: Insufficient documentation

## 2020-04-27 DIAGNOSIS — D649 Anemia, unspecified: Secondary | ICD-10-CM | POA: Insufficient documentation

## 2020-04-28 ENCOUNTER — Other Ambulatory Visit: Payer: Self-pay | Admitting: Cardiology

## 2020-04-30 ENCOUNTER — Other Ambulatory Visit: Payer: Self-pay

## 2020-04-30 ENCOUNTER — Ambulatory Visit: Payer: BC Managed Care – PPO | Admitting: Cardiology

## 2020-04-30 ENCOUNTER — Encounter: Payer: Self-pay | Admitting: Cardiology

## 2020-04-30 VITALS — BP 108/76 | HR 71 | Ht 74.0 in | Wt 281.0 lb

## 2020-04-30 DIAGNOSIS — I428 Other cardiomyopathies: Secondary | ICD-10-CM | POA: Diagnosis not present

## 2020-04-30 DIAGNOSIS — I10 Essential (primary) hypertension: Secondary | ICD-10-CM

## 2020-04-30 DIAGNOSIS — I5022 Chronic systolic (congestive) heart failure: Secondary | ICD-10-CM

## 2020-04-30 DIAGNOSIS — Z6835 Body mass index (BMI) 35.0-35.9, adult: Secondary | ICD-10-CM

## 2020-04-30 NOTE — Patient Instructions (Signed)

## 2020-04-30 NOTE — Progress Notes (Signed)
Cardiology Office Note:    Date:  04/30/2020   ID:  Raymond Alvarez, DOB 08-02-1955, MRN [id number]  PCP:  Raina Mina., MD  Cardiologist:  Jenne Campus, MD    Referring MD: Raina Mina., MD   Chief Complaint  Patient presents with  . Follow-up  Doing fine  History of Present Illness:    Raymond Alvarez is a 65 y.o. male with past medical history significant for nonischemic cardiomyopathy, status post Saint Jude BiV ICD placement in December 2021, congestive heart failure with New York Heart Association class II, obesity, dyslipidemia. Comes today to my office for follow-up overall doing well.  Denies of any chest pain tightness squeezing pressure burning chest, there is no discharges from the device.  He does not like heat therefore overall he is doing better in the wintertime.  Past Medical History:  Diagnosis Date  . Anemia   . Anxiety   . Anxiety disorder 03/20/2015  . Asthma   . Benign essential hypertension 03/20/2015   Last Assessment & Plan:  Relevant Hx: Course: Daily Update: Today's Plan:this appears stable overall  Electronically signed by: Baldemar Friday, [place] 03/20/15 1509  Last Assessment & Plan:  Formatting of this note might be different from the original. Relevant Hx: Course: Daily Update: Today's Plan:this appears stable overall  Electronically signed by: Baldemar Friday, [place] 03/20/15   . Chronic conjunctivitis of left eye 08/25/2019  . Chronic kidney disease   . Chronic systolic heart failure ([place]) 09/12/2019  . Class 2 severe obesity due to excess calories with serious comorbidity and body mass index (BMI) of 35.0 to 35.9 in adult ([place]) 03/20/2015  . Colon polyp   . 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 Depression   . Dilated cardiomyopathy ([place]) 07/15/2018   Ejection fraction 30 to 35% based on echo in June 2020  Formatting of this note might be different from the original. Ejection fraction 30 to 35% based on  echo in June 2020  . Diverticulitis   . Dyspnea on exertion 06/25/2018  . Erectile dysfunction 11/10/2019  . Gastroesophageal reflux disease without esophagitis 07/31/2016  . GERD (gastroesophageal reflux disease)   . Gout   . History of 2019 novel coronavirus disease (COVID-19) 05/12/2018  . History of prostate cancer 11/10/2019  . Hypertension   . Hyponatremia 05/11/2018  . 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 Relevant Hx: Course: Daily Update: Today's Plan:this appears stable overall, will get kenalog to he  . Iron deficiency anemia due to chronic blood loss 01/07/2017  . Mild episode of recurrent major depressive disorder ([place]) 06/17/2018  . NASH (nonalcoholic steatohepatitis) 06/17/2018  . New onset left bundle branch block (LBBB) 05/27/2018  . Obstructive sleep apnea 04/25/2019  . 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 Palpitations 03/20/2015  . Prediabetes 06/17/2018  . Prostate cancer ([place])   . Pulmonary nodule 05/12/2018  . PVC (premature ventricular contraction) 03/20/2015  . Rhabdomyolysis 05/12/2018  . Seasonal allergies   . Suspected COVID-19 virus infection 05/12/2018  . Viral gastroenteritis 05/12/2018  . Wears glasses     Past Surgical History:  Procedure Laterality Date  . APPENDECTOMY  1970  . BIV ICD INSERTION CRT-D N/A 01/13/2019  Procedure: BIV ICD INSERTION CRT-D;  Surgeon: Constance Haw, MD;  Location: [facility];  Service: Cardiovascular;  Laterality: N/A;  . COLONOSCOPY  09/24/2016   Colonic polyp status post polypectomy. Tubular adenoma.   . ESOPHAGOGASTRODUODENOSCOPY  12/10/2016   Small hiatal hernia. Mild gastritis. Gastric polyps status post  polypectomy x 4   . MASS EXCISION Left 12/15/2013   Procedure: EXCISION MUCOID TUMOR LEFT INDEX DEBRIDEMENT DISTAL INTERPHALANGEAL JOINT LEFT INDEX FINGER;  Surgeon: Daryll Brod, MD;  Location: [facility];  Service: Orthopedics;  Laterality: Left;  . PROSTATECTOMY  2015  . TONSILLECTOMY      Current Medications: Current Meds  Medication Sig  . Albuterol Sulfate (PROAIR RESPICLICK) 283 (90 Base) MCG/ACT AEPB Inhale 1-2 puffs into the lungs every 6 (six) hours as needed (wheezing/shortness of breath).  Marland Kitchen allopurinol (ZYLOPRIM) 300 MG tablet Take 300 mg by mouth daily.  . Black Pepper-Turmeric 3-500 MG CAPS Take 500 mg by mouth daily.  . carvedilol (COREG) 12.5 MG tablet TAKE 1 TABLET(12.5 MG) BY MOUTH TWICE DAILY (Patient taking differently: Take 12.5 mg by mouth 2 (two) times daily with a meal.)  . cyanocobalamin (,VITAMIN B-12,) 1000 MCG/ML injection Inject 1,000 mcg into the muscle every 30 (thirty) days.  . diclofenac Sodium (VOLTAREN) 1 % GEL Apply 1 application topically daily. Applied to right knee  . dicyclomine (BENTYL) 10 MG capsule Take 1 capsule (10 mg total) by mouth 4 (four) times daily -  before meals and at bedtime.  Marland Kitchen ENTRESTO 49-51 MG TAKE 1 TABLET BY MOUTH TWICE DAILY (Patient taking differently: Take 1 tablet by mouth 2 (two) times daily.)  . ferrous sulfate 324 MG TBEC Take 324 mg by mouth every Monday, Wednesday, and Friday.  . fluticasone (FLONASE) 50 MCG/ACT nasal spray Place 1 spray into both nostrils daily.  Marland Kitchen GNP GARLIC EXTRACT PO Take 662 mg by mouth daily.  . hyoscyamine (LEVSIN SL) 0.125 MG SL tablet Place 1 tablet (0.125 mg total) under the tongue every 4 (four) hours as needed. (Patient taking differently: Place 0.125 mg under the tongue every 4 (four) hours as needed for cramping.)  . LORazepam (ATIVAN) 1 MG tablet Take 1 mg by mouth daily as needed for anxiety.  . methocarbamol (ROBAXIN) 500 MG tablet Take 500 mg by mouth 4 (four) times daily as  needed for muscle spasms.  Marland Kitchen olopatadine (PATANOL) 0.1 % ophthalmic solution Place 1 drop into both eyes daily.  Marland Kitchen omeprazole (PRILOSEC) 20 MG capsule Take 20 mg by mouth daily.  Marland Kitchen OVER THE COUNTER MEDICATION Take 400 mg by mouth 2 (two) times daily. Parsley Leaves  . Probiotic Product (PROBIOTIC PO) Take 1 capsule by mouth daily. Unknown strength  . pyridOXINE (VITAMIN B-6) 100 MG tablet Take 200 mg by mouth daily.  Marland Kitchen spironolactone (ALDACTONE) 25 MG tablet Take 25 mg by mouth daily.   . Turmeric 500 MG TABS Take 500 mg by mouth daily.  . vitamin C (VITAMIN C) 500 MG tablet Take 1 tablet (500 mg total) by mouth daily.  Marland Kitchen vortioxetine HBr (TRINTELLIX) 20 MG TABS tablet Take 20 mg by mouth every evening.   . zinc sulfate 220 (50 Zn) MG capsule Take 1 capsule (220 mg total) by mouth daily. (Patient taking differently: Take 50 mg by mouth daily.)     Allergies:   Other and Sulfa antibiotics   Social History   Socioeconomic History  . Marital status: Married    Spouse name: Not on  file  . Number of children: 2  . Years of education: Not on file  . Highest education level: Not on file  Occupational History  . Occupation: Sport and exercise psychologist  Tobacco Use  . Smoking status: Never Smoker  . Smokeless tobacco: Never Used  Vaping Use  . Vaping Use: Never used  Substance and Sexual Activity  . Alcohol use: Yes    Comment: occ  . Drug use: No  . Sexual activity: Not on file  Other Topics Concern  . Not on file  Social History Narrative  . Not on file   Social Determinants of Health   Financial Resource Strain: Not on file  Food Insecurity: Not on file  Transportation Needs: Not on file  Physical Activity: Not on file  Stress: Not on file  Social Connections: Not on file     Family History: The patient's family history includes Breast cancer in his mother; Prostate cancer in his father and maternal grandfather. There is no history of Colon cancer. ROS:   Please see the  history of present illness.    All 14 point review of systems negative except as described per history of present illness  EKGs/Labs/Other Studies Reviewed:      Recent Labs: 05/04/2019: BUN 16; Creatinine, Ser 1.11; Potassium 4.4; Sodium 141  Recent Lipid Panel No results found for: CHOL, TRIG, HDL, CHOLHDL, VLDL, LDLCALC, LDLDIRECT  Physical Exam:    VS:  BP 108/76 (BP Location: Right Arm, Patient Position: Sitting)   Pulse 71   Ht 6\' 2"  (1.88 m)   Wt 281 lb (127.5 kg)   SpO2 96%   BMI 36.08 kg/m     Wt Readings from Last 3 Encounters:  04/30/20 281 lb (127.5 kg)  04/23/20 281 lb 3.2 oz (127.6 kg)  11/01/19 281 lb (127.5 kg)     GEN:  Well nourished, well developed in no acute distress HEENT: Normal NECK: No JVD; No carotid bruits LYMPHATICS: No lymphadenopathy CARDIAC: RRR, no murmurs, no rubs, no gallops RESPIRATORY:  Clear to auscultation without rales, wheezing or rhonchi  ABDOMEN: Soft, non-tender, non-distended MUSCULOSKELETAL:  No edema; No deformity  SKIN: Warm and dry LOWER EXTREMITIES: no swelling NEUROLOGIC:  Alert and oriented x 3 PSYCHIATRIC:  Normal affect   ASSESSMENT:    1. Chronic systolic heart failure ([place])   2. Benign essential hypertension   3. Nonischemic cardiomyopathy (HCC)   4. Class 2 severe obesity due to excess calories with serious comorbidity and body mass index (BMI) of 35.0 to 35.9 in adult [facility])    PLAN:    In order of problems listed above:  1. Congestive heart failure compensated on appropriate medication but he is able to tolerate, the biggest difficulty with facing is his blood pressure being low which prevent [place] from using all medications. 2. Essential hypertension blood pressure actually on the lower side continue present management. 3. Cardiomyopathy which is nonischemic.  On maximum tolerated medications. 4. CRT-D present.  Central device I did review interrogation normal function followed by our EP  team. 5. Dyslipidemia, his last fasting lipid profile showed LDL 107 with HDL 31.  We did talk about healthy lifestyle need to exercise and began to be more active which he promised to try to do.   Medication Adjustments/Labs and Tests Ordered: Current medicines are reviewed at length with the patient today.  Concerns regarding medicines are outlined above.  No orders of the defined types were placed in this encounter.  Medication changes: No  orders of the defined types were placed in this encounter.   Signed, Park Liter, MD, [facility] 04/30/2020 1:22 PM    [place]

## 2020-04-30 NOTE — Telephone Encounter (Signed)
Refill sent to pharmacy.   

## 2020-05-28 DIAGNOSIS — B351 Tinea unguium: Secondary | ICD-10-CM | POA: Insufficient documentation

## 2020-06-02 IMAGING — DX PORTABLE CHEST - 1 VIEW
1 series · 2 of 2 positions shown · non-contrast
Comparison: Two days ago

CLINICAL DATA: Shortness of breath

EXAM:
PORTABLE CHEST 1 VIEW

[Series 1: chest · 0.14mm/px · 2 of 2 slices shown]
[im 1/2]
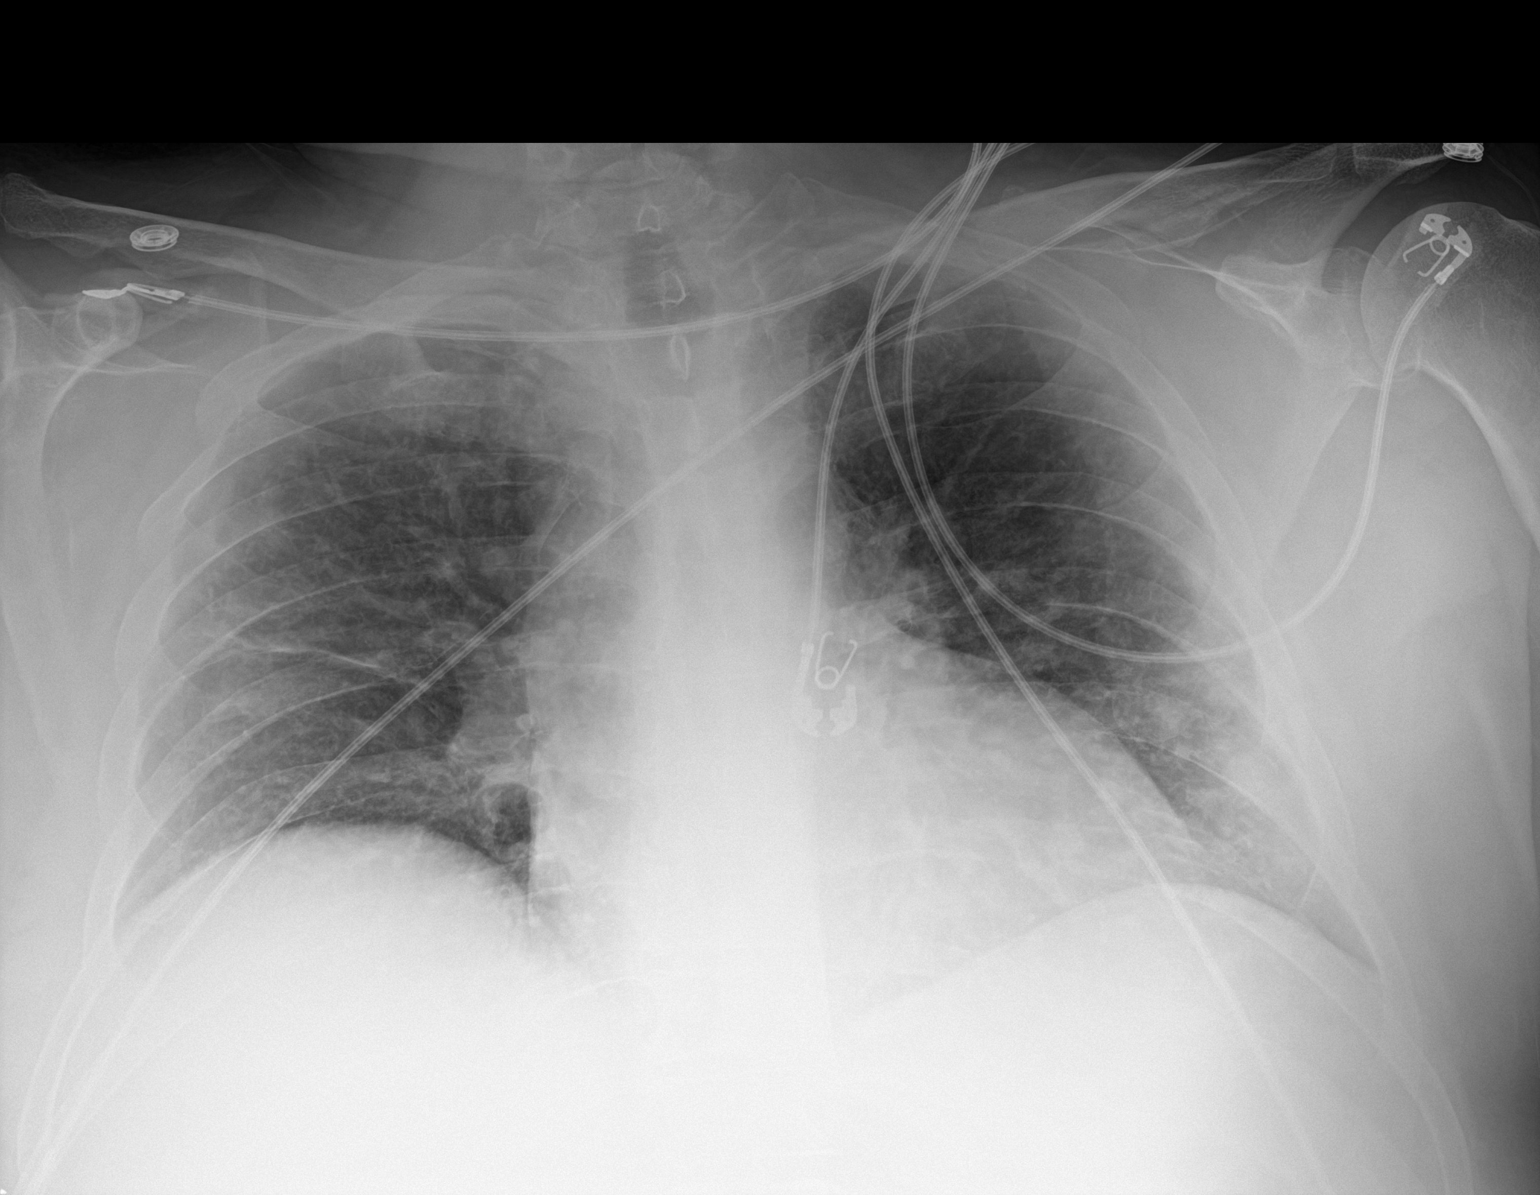
[im 2/2]
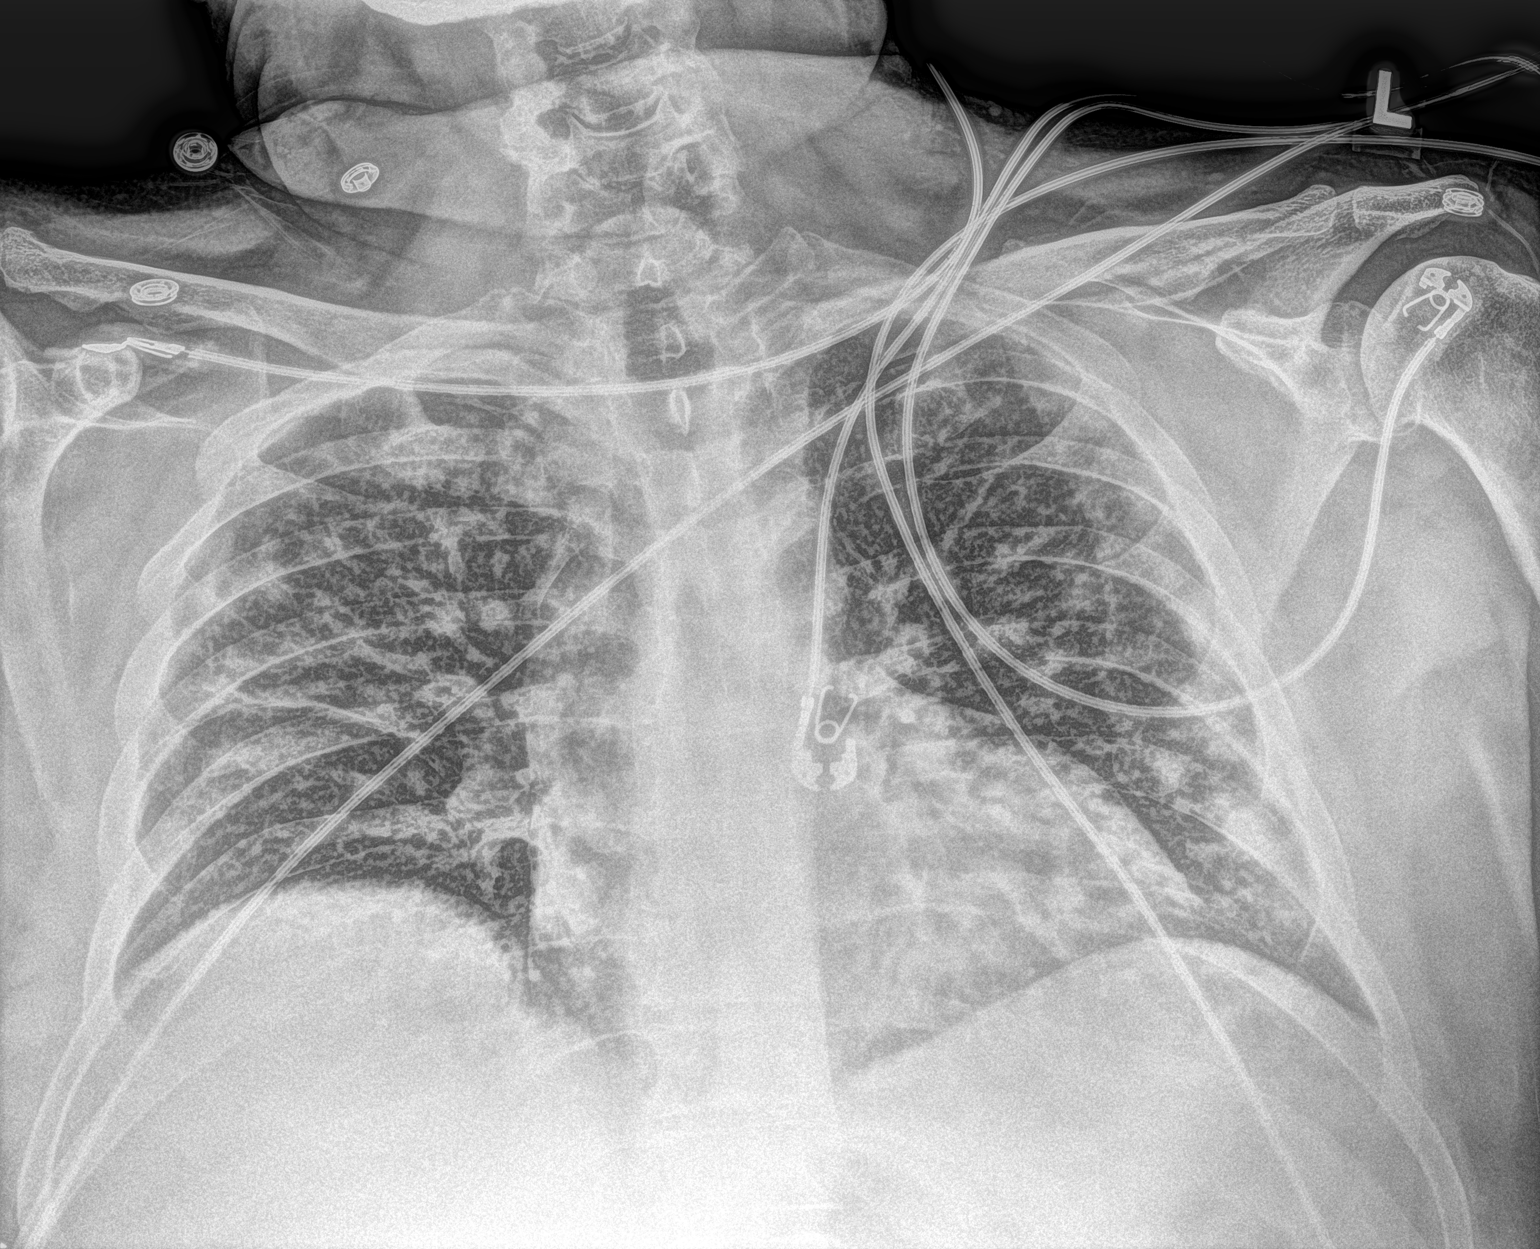

[2 of 2 positions shown; findings below may reference images not displayed]

FINDINGS: Patchy bilateral airspace disease as confirmed by CT. Low lung
volumes. Borderline heart size accentuated by technique. Negative
mediastinal contours.
IMPRESSION: Stable pneumonia and low lung volumes.

## 2020-06-04 IMAGING — DX PORTABLE CHEST - 1 VIEW
1 series · 1 of 1 positions shown · non-contrast
Comparison: 05/13/2018

CLINICAL DATA: Short of breath

EXAM:
PORTABLE CHEST 1 VIEW

[chest]
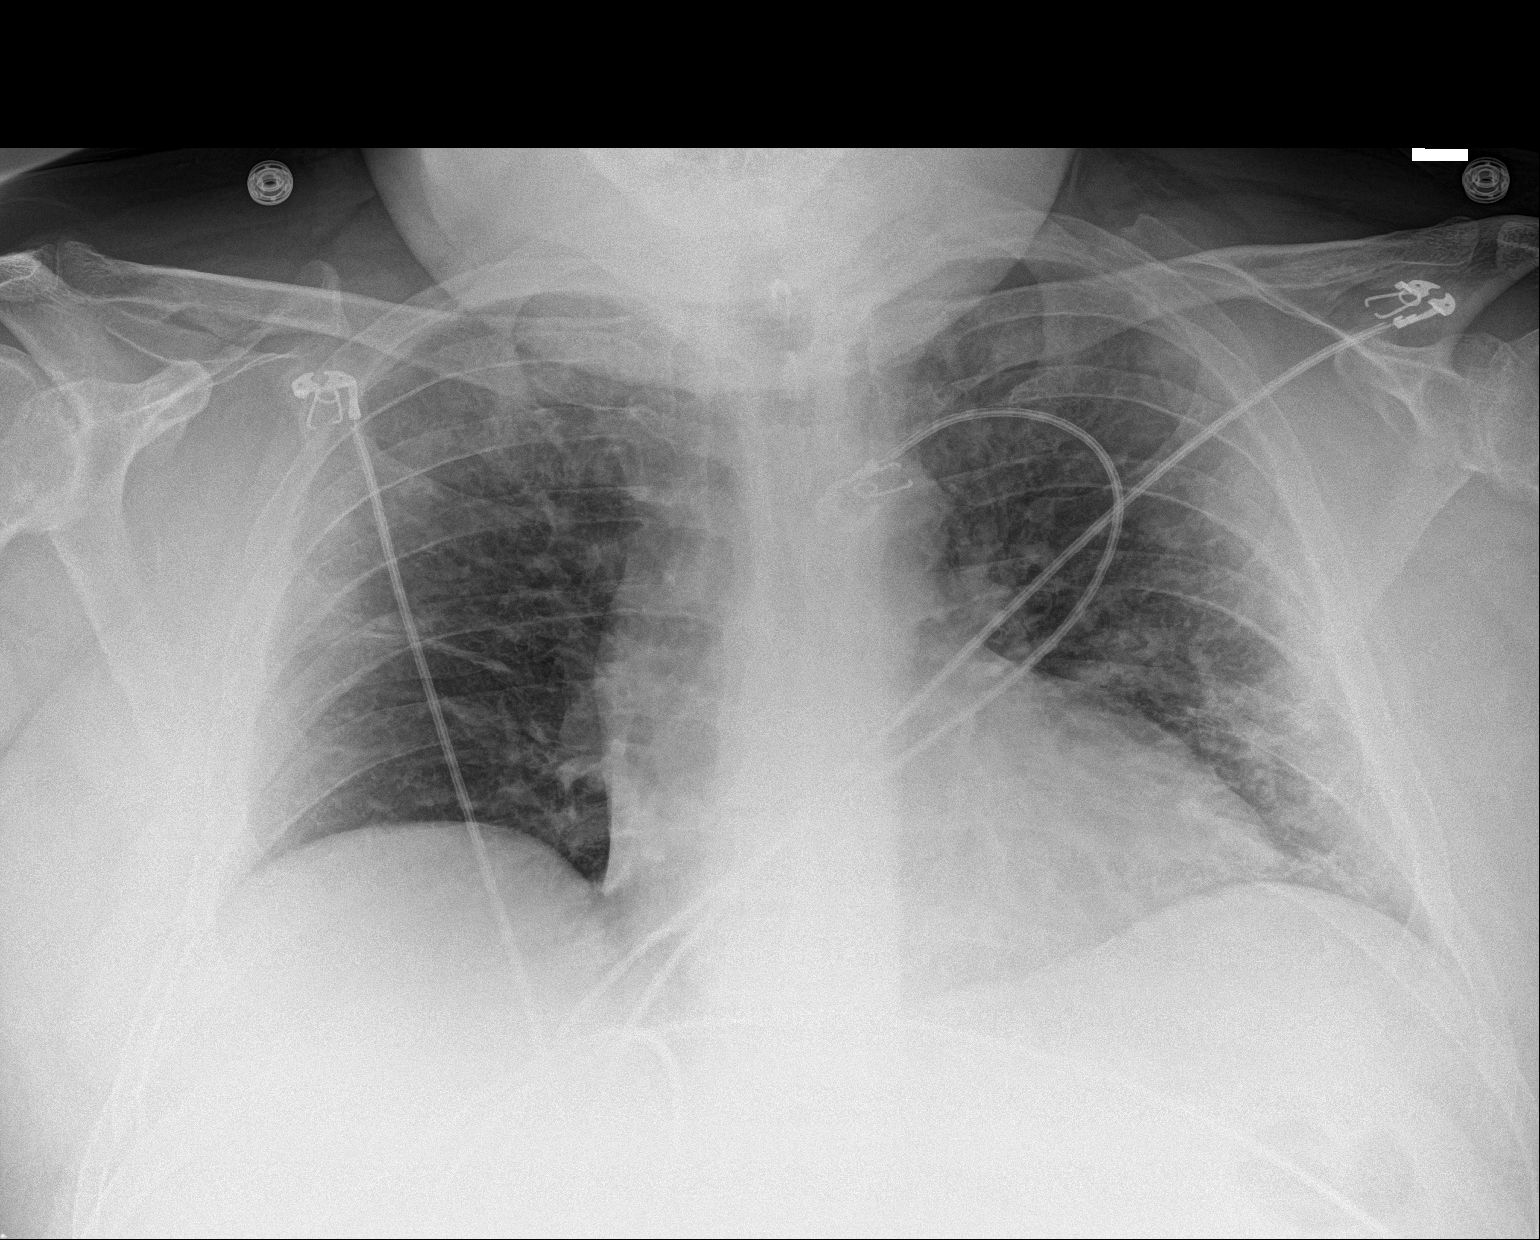

[1 of 1 positions shown; findings below may reference images not displayed]

FINDINGS: Normal heart size. Low lung volumes. There is patchy airspace
disease in the peripheral left upper and lower lung zones. Hazy
airspace opacities in the peripheral right upper lung zone. These
findings are not significantly changed. No pneumothorax.
IMPRESSION: Stable bilateral airspace disease.

## 2020-07-13 ENCOUNTER — Ambulatory Visit (INDEPENDENT_AMBULATORY_CARE_PROVIDER_SITE_OTHER): Payer: BC Managed Care – PPO

## 2020-07-13 DIAGNOSIS — I428 Other cardiomyopathies: Secondary | ICD-10-CM

## 2020-07-13 LAB — CUP PACEART REMOTE DEVICE CHECK
Battery Remaining Longevity: 67 mo
Battery Remaining Percentage: 77 %
Battery Voltage: 2.96 V
Brady Statistic AP VP Percent: 11 %
Brady Statistic AP VS Percent: 1 %
Brady Statistic AS VP Percent: 89 %
Brady Statistic AS VS Percent: 1 %
Brady Statistic RA Percent Paced: 11 %
Date Time Interrogation Session: 20220610142449
HighPow Impedance: 82 Ohm
Implantable Lead Implant Date: 20201210
Implantable Lead Implant Date: 20201210
Implantable Lead Implant Date: 20201210
Implantable Lead Location: 753858
Implantable Lead Location: 753859
Implantable Lead Location: 753860
Implantable Pulse Generator Implant Date: 20201210
Lead Channel Impedance Value: 440 Ohm
Lead Channel Impedance Value: 450 Ohm
Lead Channel Impedance Value: 490 Ohm
Lead Channel Pacing Threshold Amplitude: 0.5 V
Lead Channel Pacing Threshold Amplitude: 0.5 V
Lead Channel Pacing Threshold Amplitude: 1 V
Lead Channel Pacing Threshold Pulse Width: 0.5 ms
Lead Channel Pacing Threshold Pulse Width: 0.5 ms
Lead Channel Pacing Threshold Pulse Width: 0.5 ms
Lead Channel Sensing Intrinsic Amplitude: 12 mV
Lead Channel Sensing Intrinsic Amplitude: 5 mV
Lead Channel Setting Pacing Amplitude: 2 V
Lead Channel Setting Pacing Amplitude: 2.5 V
Lead Channel Setting Pacing Amplitude: 2.5 V
Lead Channel Setting Pacing Pulse Width: 0.5 ms
Lead Channel Setting Pacing Pulse Width: 0.5 ms
Lead Channel Setting Sensing Sensitivity: 0.5 mV
Pulse Gen Serial Number: 111014317

## 2020-07-31 NOTE — Progress Notes (Signed)
Remote ICD transmission.   

## 2020-08-24 ENCOUNTER — Telehealth: Payer: Self-pay

## 2020-08-24 NOTE — Telephone Encounter (Signed)
Notice of approval received from [employer] for Entresto 49-51 mg coverage as long as remain covered by the [facility] and there are no changes to plan benefits. Request is approved for the following time period 08/23/2020 - 08/23/2021.  Raymond Alvarez [employer] - PA Case ID: [id number] - Rx #[id number] Call us at [phone] Outcome Approved on July 21 Your PA request has been approved. Additional information will be provided in the approval communication. (Message 1145)

## 2020-10-12 ENCOUNTER — Ambulatory Visit (INDEPENDENT_AMBULATORY_CARE_PROVIDER_SITE_OTHER): Payer: BC Managed Care – PPO

## 2020-10-12 DIAGNOSIS — I428 Other cardiomyopathies: Secondary | ICD-10-CM

## 2020-10-12 LAB — CUP PACEART REMOTE DEVICE CHECK
Battery Remaining Longevity: 65 mo
Battery Remaining Percentage: 74 %
Battery Voltage: 2.96 V
Brady Statistic AP VP Percent: 10 %
Brady Statistic AP VS Percent: 1 %
Brady Statistic AS VP Percent: 90 %
Brady Statistic AS VS Percent: 1 %
Brady Statistic RA Percent Paced: 10 %
Date Time Interrogation Session: 20220909020453
HighPow Impedance: 81 Ohm
Implantable Lead Implant Date: 20201210
Implantable Lead Implant Date: 20201210
Implantable Lead Implant Date: 20201210
Implantable Lead Location: 753858
Implantable Lead Location: 753859
Implantable Lead Location: 753860
Implantable Pulse Generator Implant Date: 20201210
Lead Channel Impedance Value: 440 Ohm
Lead Channel Impedance Value: 480 Ohm
Lead Channel Impedance Value: 480 Ohm
Lead Channel Pacing Threshold Amplitude: 0.5 V
Lead Channel Pacing Threshold Amplitude: 0.5 V
Lead Channel Pacing Threshold Amplitude: 1 V
Lead Channel Pacing Threshold Pulse Width: 0.5 ms
Lead Channel Pacing Threshold Pulse Width: 0.5 ms
Lead Channel Pacing Threshold Pulse Width: 0.5 ms
Lead Channel Sensing Intrinsic Amplitude: 12 mV
Lead Channel Sensing Intrinsic Amplitude: 4.7 mV
Lead Channel Setting Pacing Amplitude: 2 V
Lead Channel Setting Pacing Amplitude: 2.5 V
Lead Channel Setting Pacing Amplitude: 2.5 V
Lead Channel Setting Pacing Pulse Width: 0.5 ms
Lead Channel Setting Pacing Pulse Width: 0.5 ms
Lead Channel Setting Sensing Sensitivity: 0.5 mV
Pulse Gen Serial Number: 111014317

## 2020-10-18 NOTE — Progress Notes (Signed)
Remote ICD transmission.   

## 2020-10-21 ENCOUNTER — Other Ambulatory Visit: Payer: Self-pay | Admitting: Cardiology

## 2020-11-19 ENCOUNTER — Encounter: Payer: Self-pay | Admitting: Cardiology

## 2020-11-19 ENCOUNTER — Other Ambulatory Visit: Payer: Self-pay

## 2020-11-19 ENCOUNTER — Ambulatory Visit: Payer: BC Managed Care – PPO | Admitting: Cardiology

## 2020-11-19 VITALS — BP 114/70 | HR 81 | Ht 74.0 in | Wt 276.0 lb

## 2020-11-19 DIAGNOSIS — I428 Other cardiomyopathies: Secondary | ICD-10-CM | POA: Diagnosis not present

## 2020-11-19 DIAGNOSIS — I10 Essential (primary) hypertension: Secondary | ICD-10-CM

## 2020-11-19 DIAGNOSIS — Z95810 Presence of automatic (implantable) cardiac defibrillator: Secondary | ICD-10-CM

## 2020-11-19 DIAGNOSIS — I5022 Chronic systolic (congestive) heart failure: Secondary | ICD-10-CM | POA: Diagnosis not present

## 2020-11-19 NOTE — Addendum Note (Signed)
Addended by: Senaida Ores on: 11/19/2020 01:11 PM   Modules accepted: Orders

## 2020-11-19 NOTE — Progress Notes (Signed)
Cardiology Office Note:    Date:  11/19/2020   ID:  Raymond Alvarez, DOB April 17, 1955, MRN [id number]  PCP:  Raina Mina., MD  Cardiologist:  Jenne Campus, MD    Referring MD: Raina Mina., MD   Chief Complaint  Patient presents with   Follow-up  I am doing very well  History of Present Illness:    Raymond Alvarez is a 65 y.o. male with past medical history significant for nonischemic cardiomyopathy latest echocardiogram from October 2021 showed ejection fraction 40 to 45%, chronic congestive heart failure New York Heart Association class II, compensated, obesity, dyslipidemia, sent Jude BiV ICD present.  He comes today 2 months follow-up overall he is doing very well denies have any chest pain tightness squeezing pressure burning chest.  He is trying to be more active now and whether is more cool.  Denies have any discharges from his defibrillator.  Complain of having some swelling of lower extremities sometimes at evening time but use elastic stockings with good results.  There is no proximal nocturnal dyspnea no palpitations no chest pain.  Past Medical History:  Diagnosis Date   Anemia    Anxiety    Anxiety disorder 03/20/2015   Asthma    Benign essential hypertension 03/20/2015   Last Assessment & Plan:  Relevant Hx: Course: Daily Update: Today's Plan:this appears stable overall  Electronically signed by: Baldemar Friday, [place] 03/20/15 1509  Last Assessment & Plan:  Formatting of this note might be different from the original. Relevant Hx: Course: Daily Update: Today's Plan:this appears stable overall  Electronically signed by: Baldemar Friday, FNP 03/20/15    Chronic conjunctivitis of left eye 08/25/2019   Chronic kidney disease    Chronic systolic heart failure ([place]) 09/12/2019   Class 2 severe obesity due to excess calories with serious comorbidity and body mass index (BMI) of 35.0 to 35.9 in adult [facility]) 03/20/2015   Colon polyp    CRT-D ICD (implantable  cardioverter-defibrillator) in place 05/04/2019   Depression    Dilated cardiomyopathy ([place]) 07/15/2018   Ejection fraction 30 to 35% based on echo in June 2020  Formatting of this note might be different from the original. 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   Palpitations 03/20/2015   Prediabetes 06/17/2018   Prostate cancer ([place])    Pulmonary nodule 05/12/2018   PVC (premature ventricular contraction) 03/20/2015   Rhabdomyolysis 05/12/2018  Seasonal allergies    Suspected COVID-19 virus infection 05/12/2018   Viral gastroenteritis 05/12/2018   Wears glasses     Past Surgical History:  Procedure Laterality Date   APPENDECTOMY  1970   BIV ICD INSERTION CRT-D N/A 01/13/2019   Procedure: BIV ICD INSERTION CRT-D;  Surgeon: Constance Haw, MD;  Location: [facility];  Service:  Cardiovascular;  Laterality: N/A;   COLONOSCOPY  09/24/2016   Colonic polyp status post polypectomy. Tubular adenoma.    ESOPHAGOGASTRODUODENOSCOPY  12/10/2016   Small hiatal hernia. Mild gastritis. Gastric polyps status post polypectomy x 4    MASS EXCISION Left 12/15/2013   Procedure: EXCISION MUCOID TUMOR LEFT INDEX DEBRIDEMENT DISTAL INTERPHALANGEAL JOINT LEFT INDEX FINGER;  Surgeon: Daryll Brod, MD;  Location: [facility];  Service: Orthopedics;  Laterality: Left;   PROSTATECTOMY  2015   TONSILLECTOMY      Current Medications: Current Meds  Medication Sig   Albuterol Sulfate (PROAIR RESPICLICK) 270 (90 Base) MCG/ACT AEPB Inhale 1-2 puffs into the lungs every 6 (six) hours as needed (wheezing/shortness of breath).   allopurinol (ZYLOPRIM) 300 MG tablet Take 300 mg by mouth daily.   Black Pepper-Turmeric 3-500 MG CAPS Take 500 mg by mouth daily.   carvedilol (COREG) 12.5 MG tablet TAKE 1 TABLET(12.5 MG) BY MOUTH TWICE DAILY (Patient taking differently: 12.5 mg 2 (two) times daily with a meal.)   cyanocobalamin (,VITAMIN B-12,) 1000 MCG/ML injection Inject 1,000 mcg into the muscle every 30 (thirty) days.   diclofenac Sodium (VOLTAREN) 1 % GEL Apply 1 application topically daily. Applied to right knee   dicyclomine (BENTYL) 10 MG capsule Take 1 capsule (10 mg total) by mouth 4 (four) times daily -  before meals and at bedtime.   ENTRESTO 49-51 MG TAKE 1 TABLET BY MOUTH TWICE DAILY (Patient taking differently: Take 1 tablet by mouth 2 (two) times daily.)   ferrous sulfate 324 MG TBEC Take 324 mg by mouth every Monday, Wednesday, and Friday.   fluticasone (FLONASE) 50 MCG/ACT nasal spray Place 1 spray into both nostrils daily.   GNP GARLIC EXTRACT PO Take 350 mg by mouth daily.   hyoscyamine (LEVSIN SL) 0.125 MG SL tablet Place 1 tablet (0.125 mg total) under the tongue every 4 (four) hours as needed. (Patient taking differently: Place 0.125 mg under the tongue every 4 (four)  hours as needed for cramping.)   LORazepam (ATIVAN) 1 MG tablet Take 1 mg by mouth daily as needed for anxiety.   methocarbamol (ROBAXIN) 500 MG tablet Take 500 mg by mouth 4 (four) times daily as needed for muscle spasms.   olopatadine (PATANOL) 0.1 % ophthalmic solution Place 1 drop into both eyes daily.   omeprazole (PRILOSEC) 20 MG capsule Take 20 mg by mouth daily.   OVER THE COUNTER MEDICATION Take 400 mg by mouth 2 (two) times daily. Parsley Leaves   Probiotic Product (PROBIOTIC PO) Take 1 capsule by mouth daily. Unknown strength   pyridOXINE (VITAMIN B-6) 100 MG tablet Take 200 mg by mouth daily.   spironolactone (ALDACTONE) 25 MG tablet Take 25 mg by mouth daily.    Turmeric 500 MG TABS Take 500 mg by mouth daily.   vitamin C (VITAMIN C) 500 MG tablet Take 1 tablet (500 mg total) by mouth daily.   vortioxetine HBr (TRINTELLIX) 20 MG TABS tablet Take 20 mg by mouth every evening.    zinc sulfate 220 (50 Zn) MG capsule Take 1 capsule (220 mg total) by  mouth daily. (Patient taking differently: Take 50 mg by mouth daily.)     Allergies:   Other and Sulfa antibiotics   Social History   Socioeconomic History   Marital status: Married    Spouse name: Not on file   Number of children: 2   Years of education: Not on file   Highest education level: Not on file  Occupational History   Occupation: Sport and exercise psychologist  Tobacco Use   Smoking status: Never   Smokeless tobacco: Never  Vaping Use   Vaping Use: Never used  Substance and Sexual Activity   Alcohol use: Yes    Comment: occ   Drug use: No   Sexual activity: Not on file  Other Topics Concern   Not on file  Social History Narrative   Not on file   Social Determinants of Health   Financial Resource Strain: Not on file  Food Insecurity: Not on file  Transportation Needs: Not on file  Physical Activity: Not on file  Stress: Not on file  Social Connections: Not on file     Family History: The patient's family  history includes Breast cancer in his mother; Prostate cancer in his father and maternal grandfather. There is no history of Colon cancer. ROS:   Please see the history of present illness.    All 14 point review of systems negative except as described per history of present illness  EKGs/Labs/Other Studies Reviewed:      Recent Labs: No results found for requested labs within last 8760 hours.  Recent Lipid Panel No results found for: CHOL, TRIG, HDL, CHOLHDL, VLDL, LDLCALC, LDLDIRECT  Physical Exam:    VS:  BP 114/70 (BP Location: Left Arm, Patient Position: Sitting)   Pulse 81   Ht 6\' 2"  (1.88 m)   Wt 276 lb (125.2 kg)   SpO2 98%   BMI 35.44 kg/m     Wt Readings from Last 3 Encounters:  11/19/20 276 lb (125.2 kg)  04/30/20 281 lb (127.5 kg)  04/23/20 281 lb 3.2 oz (127.6 kg)     GEN:  Well nourished, well developed in no acute distress HEENT: Normal NECK: No JVD; No carotid bruits LYMPHATICS: No lymphadenopathy CARDIAC: RRR, no murmurs, no rubs, no gallops RESPIRATORY:  Clear to auscultation without rales, wheezing or rhonchi  ABDOMEN: Soft, non-tender, non-distended MUSCULOSKELETAL:  No edema; No deformity  SKIN: Warm and dry LOWER EXTREMITIES: no swelling NEUROLOGIC:  Alert and oriented x 3 PSYCHIATRIC:  Normal affect   ASSESSMENT:    1. Nonischemic cardiomyopathy ([place])   2. Benign essential hypertension   3. Chronic systolic heart failure ([place])   4. 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  Overall he seems to be compensated.  New York Heart Association class II.  He is on carvedilol 12.5 twice daily, Entresto 49/51 twice daily, he is also on Aldactone 25 daily.  I will consider adding SGLT2 blocker.  Will repeat echocardiogram based on results of echocardiogram will decide about SGL 2. Essential hypertension blood  pressure well controlled continue present management. Congestive heart failure compensated, CRT-D device present.  Interrogation reviewed stable normal function.  No discharges I did review laboratory test done by his primary care physician summertime which show LDL direct of 106 he is on no statin however there is significant decrease in his LDL he prefer not to  go on medication he promised to be more active during the wintertime we will recheck later and see how he is doing.   Medication Adjustments/Labs and Tests Ordered: Current medicines are reviewed at length with the patient today.  Concerns regarding medicines are outlined above.  No orders of the defined types were placed in this encounter.  Medication changes: No orders of the defined types were placed in this encounter.   Signed, Park Liter, MD, [facility] 11/19/2020 1:02 PM    [place]

## 2020-11-19 NOTE — Patient Instructions (Signed)
Medication Instructions:  Your physician recommends that you continue on your current medications as directed. Please refer to the Current Medication list given to you today.  *If you need a refill on your cardiac medications before your next appointment, please call your pharmacy*   Lab Work: None If you have labs (blood work) drawn today and your tests are completely normal, you will receive your results only by: [place] (if you have MyChart) OR A paper copy in the mail If you have any lab test that is abnormal or we need to change your treatment, we will call you to review the results.   Testing/Procedures: Your physician has requested that you have an echocardiogram. Echocardiography is a painless test that uses sound waves to create images of your heart. It provides your doctor with information about the size and shape of your heart and how well your heart's chambers and valves are working. This procedure takes approximately one hour. There are no restrictions for this procedure.    Follow-Up: At [facility], you and your health needs are our priority.  As part of our continuing mission to provide you with exceptional heart care, we have created designated Provider Care Teams.  These Care Teams include your primary Cardiologist (physician) and Advanced Practice Providers (APPs -  Physician Assistants and Nurse Practitioners) who all work together to provide you with the care you need, when you need it.  We recommend signing up for the patient portal called "MyChart".  Sign up information is provided on this After Visit Summary.  MyChart is used to connect with patients for Virtual Visits (Telemedicine).  Patients are able to view lab/test results, encounter notes, upcoming appointments, etc.  Non-urgent messages can be sent to your provider as well.   To learn more about what you can do with MyChart, go to [email or web address].    Your next appointment:   1  month(s)  The format for your next appointment:   In Person  Provider:   Jenne Campus, MD   Other Instructions

## 2020-12-04 ENCOUNTER — Other Ambulatory Visit: Payer: Self-pay

## 2020-12-04 ENCOUNTER — Ambulatory Visit (INDEPENDENT_AMBULATORY_CARE_PROVIDER_SITE_OTHER): Payer: BC Managed Care – PPO

## 2020-12-04 DIAGNOSIS — I10 Essential (primary) hypertension: Secondary | ICD-10-CM

## 2020-12-04 DIAGNOSIS — Z95810 Presence of automatic (implantable) cardiac defibrillator: Secondary | ICD-10-CM

## 2020-12-04 DIAGNOSIS — I5022 Chronic systolic (congestive) heart failure: Secondary | ICD-10-CM

## 2020-12-04 DIAGNOSIS — I428 Other cardiomyopathies: Secondary | ICD-10-CM

## 2020-12-04 LAB — ECHOCARDIOGRAM COMPLETE
Area-P 1/2: 3.51 cm2
Calc EF: 47 %
S' Lateral: 4.1 cm
Single Plane A2C EF: 51 %
Single Plane A4C EF: 42.4 %

## 2021-01-11 ENCOUNTER — Ambulatory Visit (INDEPENDENT_AMBULATORY_CARE_PROVIDER_SITE_OTHER): Payer: BC Managed Care – PPO

## 2021-01-11 DIAGNOSIS — I428 Other cardiomyopathies: Secondary | ICD-10-CM

## 2021-01-13 LAB — CUP PACEART REMOTE DEVICE CHECK
Battery Remaining Longevity: 64 mo
Battery Remaining Percentage: 72 %
Battery Voltage: 2.96 V
Brady Statistic AP VP Percent: 10 %
Brady Statistic AP VS Percent: 1 %
Brady Statistic AS VP Percent: 90 %
Brady Statistic AS VS Percent: 1 %
Brady Statistic RA Percent Paced: 9.8 %
Date Time Interrogation Session: 20221209125011
HighPow Impedance: 75 Ohm
Implantable Lead Implant Date: 20201210
Implantable Lead Implant Date: 20201210
Implantable Lead Implant Date: 20201210
Implantable Lead Location: 753858
Implantable Lead Location: 753859
Implantable Lead Location: 753860
Implantable Pulse Generator Implant Date: 20201210
Lead Channel Impedance Value: 440 Ohm
Lead Channel Impedance Value: 500 Ohm
Lead Channel Impedance Value: 530 Ohm
Lead Channel Pacing Threshold Amplitude: 0.5 V
Lead Channel Pacing Threshold Amplitude: 0.5 V
Lead Channel Pacing Threshold Amplitude: 1 V
Lead Channel Pacing Threshold Pulse Width: 0.5 ms
Lead Channel Pacing Threshold Pulse Width: 0.5 ms
Lead Channel Pacing Threshold Pulse Width: 0.5 ms
Lead Channel Sensing Intrinsic Amplitude: 12 mV
Lead Channel Sensing Intrinsic Amplitude: 5 mV
Lead Channel Setting Pacing Amplitude: 2 V
Lead Channel Setting Pacing Amplitude: 2.5 V
Lead Channel Setting Pacing Amplitude: 2.5 V
Lead Channel Setting Pacing Pulse Width: 0.5 ms
Lead Channel Setting Pacing Pulse Width: 0.5 ms
Lead Channel Setting Sensing Sensitivity: 0.5 mV
Pulse Gen Serial Number: 111014317

## 2021-01-16 ENCOUNTER — Ambulatory Visit (INDEPENDENT_AMBULATORY_CARE_PROVIDER_SITE_OTHER): Payer: BC Managed Care – PPO | Admitting: Cardiology

## 2021-01-16 ENCOUNTER — Encounter: Payer: Self-pay | Admitting: Cardiology

## 2021-01-16 ENCOUNTER — Other Ambulatory Visit: Payer: Self-pay

## 2021-01-16 VITALS — BP 112/70 | HR 74 | Ht 74.0 in | Wt 270.0 lb

## 2021-01-16 DIAGNOSIS — I428 Other cardiomyopathies: Secondary | ICD-10-CM | POA: Diagnosis not present

## 2021-01-16 DIAGNOSIS — I5022 Chronic systolic (congestive) heart failure: Secondary | ICD-10-CM

## 2021-01-16 DIAGNOSIS — I10 Essential (primary) hypertension: Secondary | ICD-10-CM | POA: Diagnosis not present

## 2021-01-16 DIAGNOSIS — Z95810 Presence of automatic (implantable) cardiac defibrillator: Secondary | ICD-10-CM

## 2021-01-16 DIAGNOSIS — R079 Chest pain, unspecified: Secondary | ICD-10-CM

## 2021-01-16 NOTE — Progress Notes (Signed)
Cardiology Office Note:    Date:  01/16/2021   ID:  Raymond Alvarez, DOB Jan 16, 1956, MRN [id number]  PCP:  Raina Mina., MD  Cardiologist:  Jenne Campus, MD    Referring MD: Raina Mina., MD   Chief Complaint  Patient presents with   Results    Echo    History of Present Illness:    Raymond Alvarez is a 65 y.o. male  with past medical history significant for nonischemic cardiomyopathy latest echocardiogram from October 2021 showed ejection fraction 40 to 45%, chronic congestive heart failure New York Heart Association class II, compensated, obesity, dyslipidemia, sent Jude BiV ICD present. Recently had another echocardiogram done.  Echocardiogram shows still diminished ejection fraction in the neighborhood of 40 to 45% but but was disturbing a dysthymia have description of segmental motion abnormalities.  Before he did not have coronary artery disease but he does have risk factors for dysphoric she need to be checked for that.  He denies having any type of squeezing pressure burning chest but describes one-time episode of chest pain when he was driving a car lasted for few minutes.  No exertional chest pain.  Past Medical History:  Diagnosis Date   Anemia    Anxiety    Anxiety disorder 03/20/2015   Asthma    Benign essential hypertension 03/20/2015   Last Assessment & Plan:  Relevant Hx: Course: Daily Update: Today's Plan:this appears stable overall  Electronically signed by: Baldemar Friday, [place] 03/20/15 1509  Last Assessment & Plan:  Formatting of this note might be different from the original. 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   Palpitations 03/20/2015   Prediabetes 06/17/2018   Prostate cancer ([place])    Pulmonary nodule 05/12/2018  PVC (premature ventricular contraction) 03/20/2015   Rhabdomyolysis 05/12/2018   Seasonal allergies    Suspected COVID-19 virus infection 05/12/2018   Viral gastroenteritis 05/12/2018   Wears glasses     Past Surgical History:  Procedure Laterality Date   APPENDECTOMY  1970   BIV ICD INSERTION CRT-D N/A 01/13/2019   Procedure: BIV ICD INSERTION CRT-D;  Surgeon:  Constance Haw, MD;  Location: [facility];  Service: Cardiovascular;  Laterality: N/A;   COLONOSCOPY  09/24/2016   Colonic polyp status post polypectomy. Tubular adenoma.    ESOPHAGOGASTRODUODENOSCOPY  12/10/2016   Small hiatal hernia. Mild gastritis. Gastric polyps status post polypectomy x 4    MASS EXCISION Left 12/15/2013   Procedure: EXCISION MUCOID TUMOR LEFT INDEX DEBRIDEMENT DISTAL INTERPHALANGEAL JOINT LEFT INDEX FINGER;  Surgeon: Daryll Brod, MD;  Location: [facility];  Service: Orthopedics;  Laterality: Left;   PROSTATECTOMY  2015   TONSILLECTOMY      Current Medications: Current Meds  Medication Sig   Albuterol Sulfate (PROAIR RESPICLICK) 379 (90 Base) MCG/ACT AEPB Inhale 1-2 puffs into the lungs every 6 (six) hours as needed (wheezing/shortness of breath).   allopurinol (ZYLOPRIM) 300 MG tablet Take 300 mg by mouth daily.   Black Pepper-Turmeric 3-500 MG CAPS Take 500 mg by mouth daily.   carvedilol (COREG) 12.5 MG tablet TAKE 1 TABLET(12.5 MG) BY MOUTH TWICE DAILY (Patient taking differently: Take 12.5 mg by mouth 2 (two) times daily with a meal.)   cyanocobalamin (,VITAMIN B-12,) 1000 MCG/ML injection Inject 1,000 mcg into the muscle every 30 (thirty) days.   diclofenac Sodium (VOLTAREN) 1 % GEL Apply 1 application topically daily. Applied to right knee   dicyclomine (BENTYL) 10 MG capsule Take 1 capsule (10 mg total) by mouth 4 (four) times daily -  before meals and at bedtime.   ENTRESTO 49-51 MG TAKE 1 TABLET BY MOUTH TWICE DAILY (Patient taking differently: Take 1 tablet by mouth 2 (two) times daily.)   ferrous sulfate 324 MG TBEC Take 324 mg by mouth every Monday, Wednesday, and Friday.   fluticasone (FLONASE) 50 MCG/ACT nasal spray Place 1 spray into both nostrils daily.   GNP GARLIC EXTRACT PO Take 024 mg by mouth daily. Unknown strength   hyoscyamine (LEVSIN SL) 0.125 MG SL tablet Place 1 tablet (0.125 mg total) under the tongue every 4  (four) hours as needed. (Patient taking differently: Place 0.125 mg under the tongue every 4 (four) hours as needed for cramping.)   LORazepam (ATIVAN) 1 MG tablet Take 1 mg by mouth daily as needed for anxiety.   methocarbamol (ROBAXIN) 500 MG tablet Take 500 mg by mouth 4 (four) times daily as needed for muscle spasms.   olopatadine (PATANOL) 0.1 % ophthalmic solution Place 1 drop into both eyes daily.   omeprazole (PRILOSEC) 20 MG capsule Take 20 mg by mouth daily.   OVER THE COUNTER MEDICATION Take 400 mg by mouth 2 (two) times daily. Parsley Leaves   Probiotic Product (PROBIOTIC PO) Take 1 capsule by mouth daily. Unknown strength   pyridOXINE (VITAMIN B-6) 100 MG tablet Take 200 mg by mouth daily.   spironolactone (ALDACTONE) 25 MG tablet Take 25 mg by mouth daily.    Turmeric 500 MG TABS Take 500 mg by mouth daily.   vitamin C (VITAMIN C) 500 MG tablet Take 1 tablet (500 mg total) by mouth daily.   vortioxetine HBr (TRINTELLIX) 20 MG TABS tablet Take 20 mg by mouth every evening.  zinc sulfate 220 (50 Zn) MG capsule Take 1 capsule (220 mg total) by mouth daily. (Patient taking differently: Take 50 mg by mouth daily.)     Allergies:   Other and Sulfa antibiotics   Social History   Socioeconomic History   Marital status: Married    Spouse name: Not on file   Number of children: 2   Years of education: Not on file   Highest education level: Not on file  Occupational History   Occupation: Sport and exercise psychologist  Tobacco Use   Smoking status: Never   Smokeless tobacco: Never  Vaping Use   Vaping Use: Never used  Substance and Sexual Activity   Alcohol use: Yes    Comment: occ   Drug use: No   Sexual activity: Not on file  Other Topics Concern   Not on file  Social History Narrative   Not on file   Social Determinants of Health   Financial Resource Strain: Not on file  Food Insecurity: Not on file  Transportation Needs: Not on file  Physical Activity: Not on file   Stress: Not on file  Social Connections: Not on file     Family History: The patient's family history includes Breast cancer in his mother; Prostate cancer in his father and maternal grandfather. There is no history of Colon cancer. ROS:   Please see the history of present illness.    All 14 point review of systems negative except as described per history of present illness  EKGs/Labs/Other Studies Reviewed:      Recent Labs: No results found for requested labs within last 8760 hours.  Recent Lipid Panel No results found for: CHOL, TRIG, HDL, CHOLHDL, VLDL, LDLCALC, LDLDIRECT  Physical Exam:    VS:  BP 112/70 (BP Location: Left Arm, Patient Position: Sitting)    Pulse 74    Ht 6\' 2"  (1.88 m)    Wt 270 lb (122.5 kg)    SpO2 99%    BMI 34.67 kg/m     Wt Readings from Last 3 Encounters:  01/16/21 270 lb (122.5 kg)  11/19/20 276 lb (125.2 kg)  04/30/20 281 lb (127.5 kg)     GEN:  Well nourished, well developed in no acute distress HEENT: Normal NECK: No JVD; No carotid bruits LYMPHATICS: No lymphadenopathy CARDIAC: RRR, no murmurs, no rubs, no gallops RESPIRATORY:  Clear to auscultation without rales, wheezing or rhonchi  ABDOMEN: Soft, non-tender, non-distended MUSCULOSKELETAL:  No edema; No deformity  SKIN: Warm and dry LOWER EXTREMITIES: no swelling NEUROLOGIC:  Alert and oriented x 3 PSYCHIATRIC:  Normal affect   ASSESSMENT:    1. Chronic systolic heart failure ([place])   2. Nonischemic cardiomyopathy ([place])   3. Benign essential hypertension   4. 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  He is on Coreg 12.5 twice daily as well as Entresto which I will continue.  Because we do have new segmental wall motion abnormalities I will ask him to have stress test done to make sure he does not have any inducible  ischemia. Benign essential hypertension blood pressure on the lower side that is why I am not adding any medications to his management right now. ICD present I did review interrogation of his device he got 5.3 years left in the device.No arrhythmias detected no treatment delivered.   Medication Adjustments/Labs and Tests  Ordered: Current medicines are reviewed at length with the patient today.  Concerns regarding medicines are outlined above.  No orders of the defined types were placed in this encounter.  Medication changes: No orders of the defined types were placed in this encounter.   Signed, Park Liter, MD, [facility] 01/16/2021 3:55 PM    [place]

## 2021-01-16 NOTE — Patient Instructions (Signed)
Medication Instructions:  Your physician recommends that you continue on your current medications as directed. Please refer to the Current Medication list given to you today.  *If you need a refill on your cardiac medications before your next appointment, please call your pharmacy*   Lab Work: None If you have labs (blood work) drawn today and your tests are completely normal, you will receive your results only by: [place] (if you have MyChart) OR A paper copy in the mail If you have any lab test that is abnormal or we need to change your treatment, we will call you to review the results.   Testing/Procedures:   [facility] [address] [place], [place] [address] Phone:  [phone]    Please arrive 15 minutes prior to your appointment time for registration and insurance purposes.  The test will take approximately 3 to 4 hours to complete; you may bring reading material.  If someone comes with you to your appointment, they will need to remain in the main lobby due to limited space in the testing area. **If you are pregnant or breastfeeding, please notify the nuclear lab prior to your appointment**  How to prepare for your Myocardial Perfusion Test: Do not eat or drink 3 hours prior to your test, except you may have water. Do not consume products containing caffeine (regular or decaffeinated) 12 hours prior to your test. (ex: coffee, chocolate, sodas, tea). Do bring a list of your current medications with you.  If not listed below, you may take your medications as normal.  Do wear comfortable clothes (no dresses or overalls) and walking shoes, tennis shoes preferred (No heels or open toe shoes are allowed). Do NOT wear cologne, perfume, aftershave, or lotions (deodorant is allowed). If these instructions are not followed, your test will have to be rescheduled.  Please report to [address] for your test.  If you have questions or  concerns about your appointment, you can call the [facility] at [phone].  If you cannot keep your appointment, please provide 24 hours notification to the Nuclear Lab, to avoid a possible $50 charge to your account.    Follow-Up: At [facility], you and your health needs are our priority.  As part of our continuing mission to provide you with exceptional heart care, we have created designated Provider Care Teams.  These Care Teams include your primary Cardiologist (physician) and Advanced Practice Providers (APPs -  Physician Assistants and Nurse Practitioners) who all work together to provide you with the care you need, when you need it.  We recommend signing up for the patient portal called "MyChart".  Sign up information is provided on this After Visit Summary.  MyChart is used to connect with patients for Virtual Visits (Telemedicine).  Patients are able to view lab/test results, encounter notes, upcoming appointments, etc.  Non-urgent messages can be sent to your provider as well.   To learn more about what you can do with MyChart, go to [email or web address].    Your next appointment:   6 month(s)  The format for your next appointment:   In Person  Provider:   Jenne Campus, MD    Other Instructions  Cardiac Nuclear Scan A cardiac nuclear scan is a test that measures blood flow to the heart when a person is resting and when he or she is exercising. The test looks for problems such as: Not enough blood reaching a portion of the heart.  The heart muscle not working normally. You may need this test if: You have heart disease. You have had abnormal lab results. You have had heart surgery or a balloon procedure to open up blocked arteries (angioplasty). You have chest pain. You have shortness of breath. In this test, a radioactive dye (tracer) is injected into your bloodstream. After the tracer has traveled to your heart, an imaging  device is used to measure how much of the tracer is absorbed by or distributed to various areas of your heart. This procedure is usually done at a hospital and takes 2-4 hours. Tell a health care provider about: Any allergies you have. All medicines you are taking, including vitamins, herbs, eye drops, creams, and over-the-counter medicines. Any problems you or family members have had with anesthetic medicines. Any blood disorders you have. Any surgeries you have had. Any medical conditions you have. Whether you are pregnant or may be pregnant. What are the risks? Generally, this is a safe procedure. However, problems may occur, including: Serious chest pain and heart attack. This is only a risk if the stress portion of the test is done. Rapid heartbeat. Sensation of warmth in your chest. This usually passes quickly. Allergic reaction to the tracer. What happens before the procedure? Ask your health care provider about changing or stopping your regular medicines. This is especially important if you are taking diabetes medicines or blood thinners. Follow instructions from your health care provider about eating or drinking restrictions. Remove your jewelry on the day of the procedure. What happens during the procedure? An IV will be inserted into one of your veins. Your health care provider will inject a small amount of radioactive tracer through the IV. You will wait for 20-40 minutes while the tracer travels through your bloodstream. Your heart activity will be monitored with an electrocardiogram (ECG). You will lie down on an exam table. Images of your heart will be taken for about 15-20 minutes. You may also have a stress test. For this test, one of the following may be done: You will exercise on a treadmill or stationary bike. While you exercise, your heart's activity will be monitored with an ECG, and your blood pressure will be checked. You will be given medicines that will increase  blood flow to parts of your heart. This is done if you are unable to exercise. When blood flow to your heart has peaked, a tracer will again be injected through the IV. After 20-40 minutes, you will get back on the exam table and have more images taken of your heart. Depending on the type of tracer used, scans may need to be repeated 3-4 hours later. Your IV line will be removed when the procedure is over. The procedure may vary among health care providers and hospitals. What happens after the procedure? Unless your health care provider tells you otherwise, you may return to your normal schedule, including diet, activities, and medicines. Unless your health care provider tells you otherwise, you may increase your fluid intake. This will help to flush the contrast dye from your body. Drink enough fluid to keep your urine pale yellow. Ask your health care provider, or the department that is doing the test: When will my results be ready? How will I get my results? Summary A cardiac nuclear scan measures the blood flow to the heart when a person is resting and when he or she is exercising. Tell your health care provider if you are pregnant. Before the procedure, ask your  health care provider about changing or stopping your regular medicines. This is especially important if you are taking diabetes medicines or blood thinners. After the procedure, unless your health care provider tells you otherwise, increase your fluid intake. This will help flush the contrast dye from your body. After the procedure, unless your health care provider tells you otherwise, you may return to your normal schedule, including diet, activities, and medicines. This information is not intended to replace advice given to you by your health care provider. Make sure you discuss any questions you have with your health care provider. Document Revised: 10/03/2020 Document Reviewed: 07/04/2020 Elsevier Patient Education  [place].

## 2021-01-21 NOTE — Progress Notes (Signed)
Remote ICD transmission.   

## 2021-01-29 ENCOUNTER — Telehealth (HOSPITAL_COMMUNITY): Payer: Self-pay | Admitting: *Deleted

## 2021-01-29 NOTE — Telephone Encounter (Signed)
Left message on voicemail per DPR in reference to upcoming appointment scheduled on 02/05/2021 at 8:00 with detailed instructions given per Myocardial Perfusion Study Information Sheet for the test. LM to arrive 15 minutes early, and that it is imperative to arrive on time for appointment to keep from having the test rescheduled. If you need to cancel or reschedule your appointment, please call the office within 24 hours of your appointment. Failure to do so may result in a cancellation of your appointment, and a $50 no show fee. Phone number given for call back for any questions.

## 2021-02-02 IMAGING — CR DG CHEST 2V
2 series · 2 of 2 positions shown · non-contrast
Comparison: 05/16/2018

CLINICAL DATA: Follow-up defibrillator placement

EXAM:
CHEST - 2 VIEW

[w chest pa]
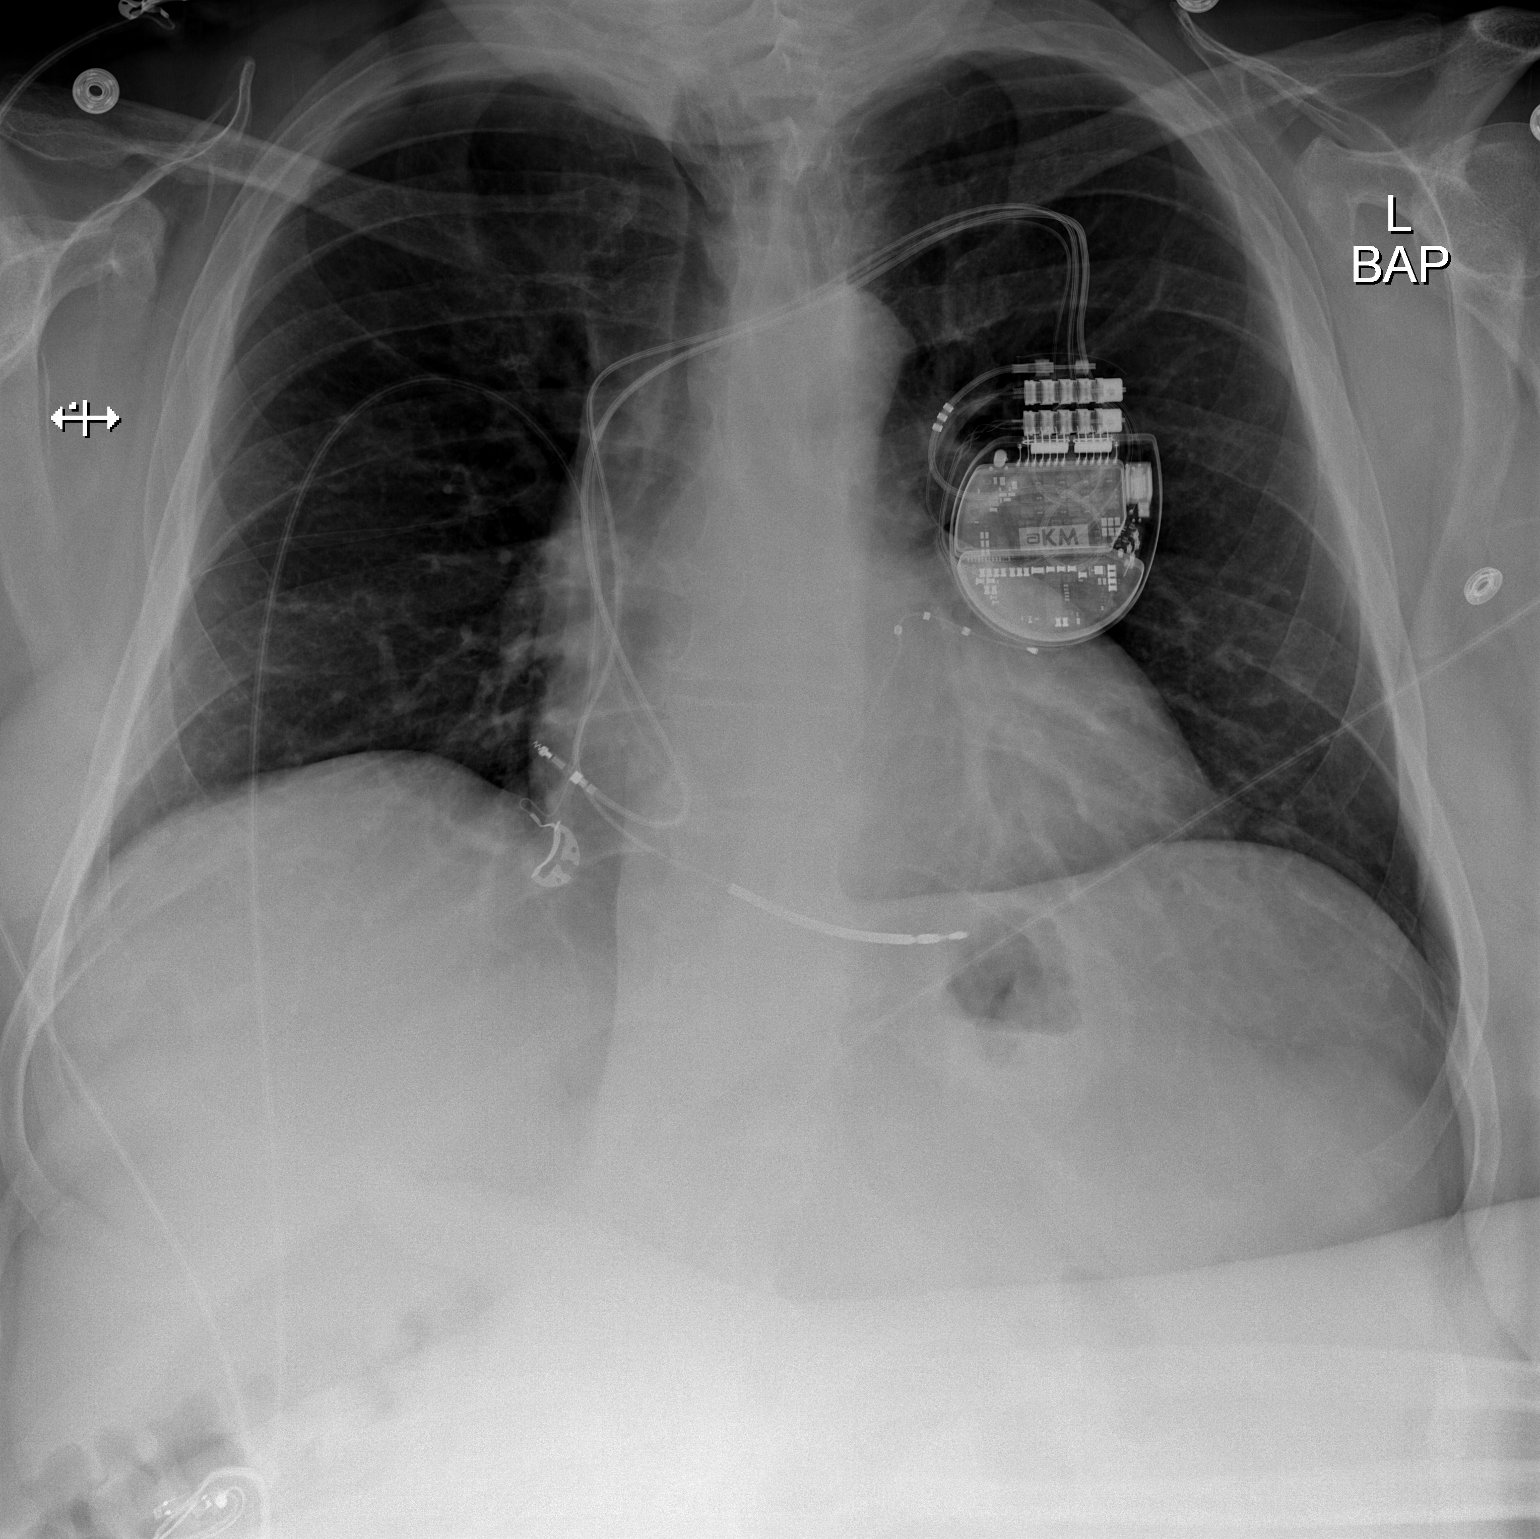

[w chest lat]
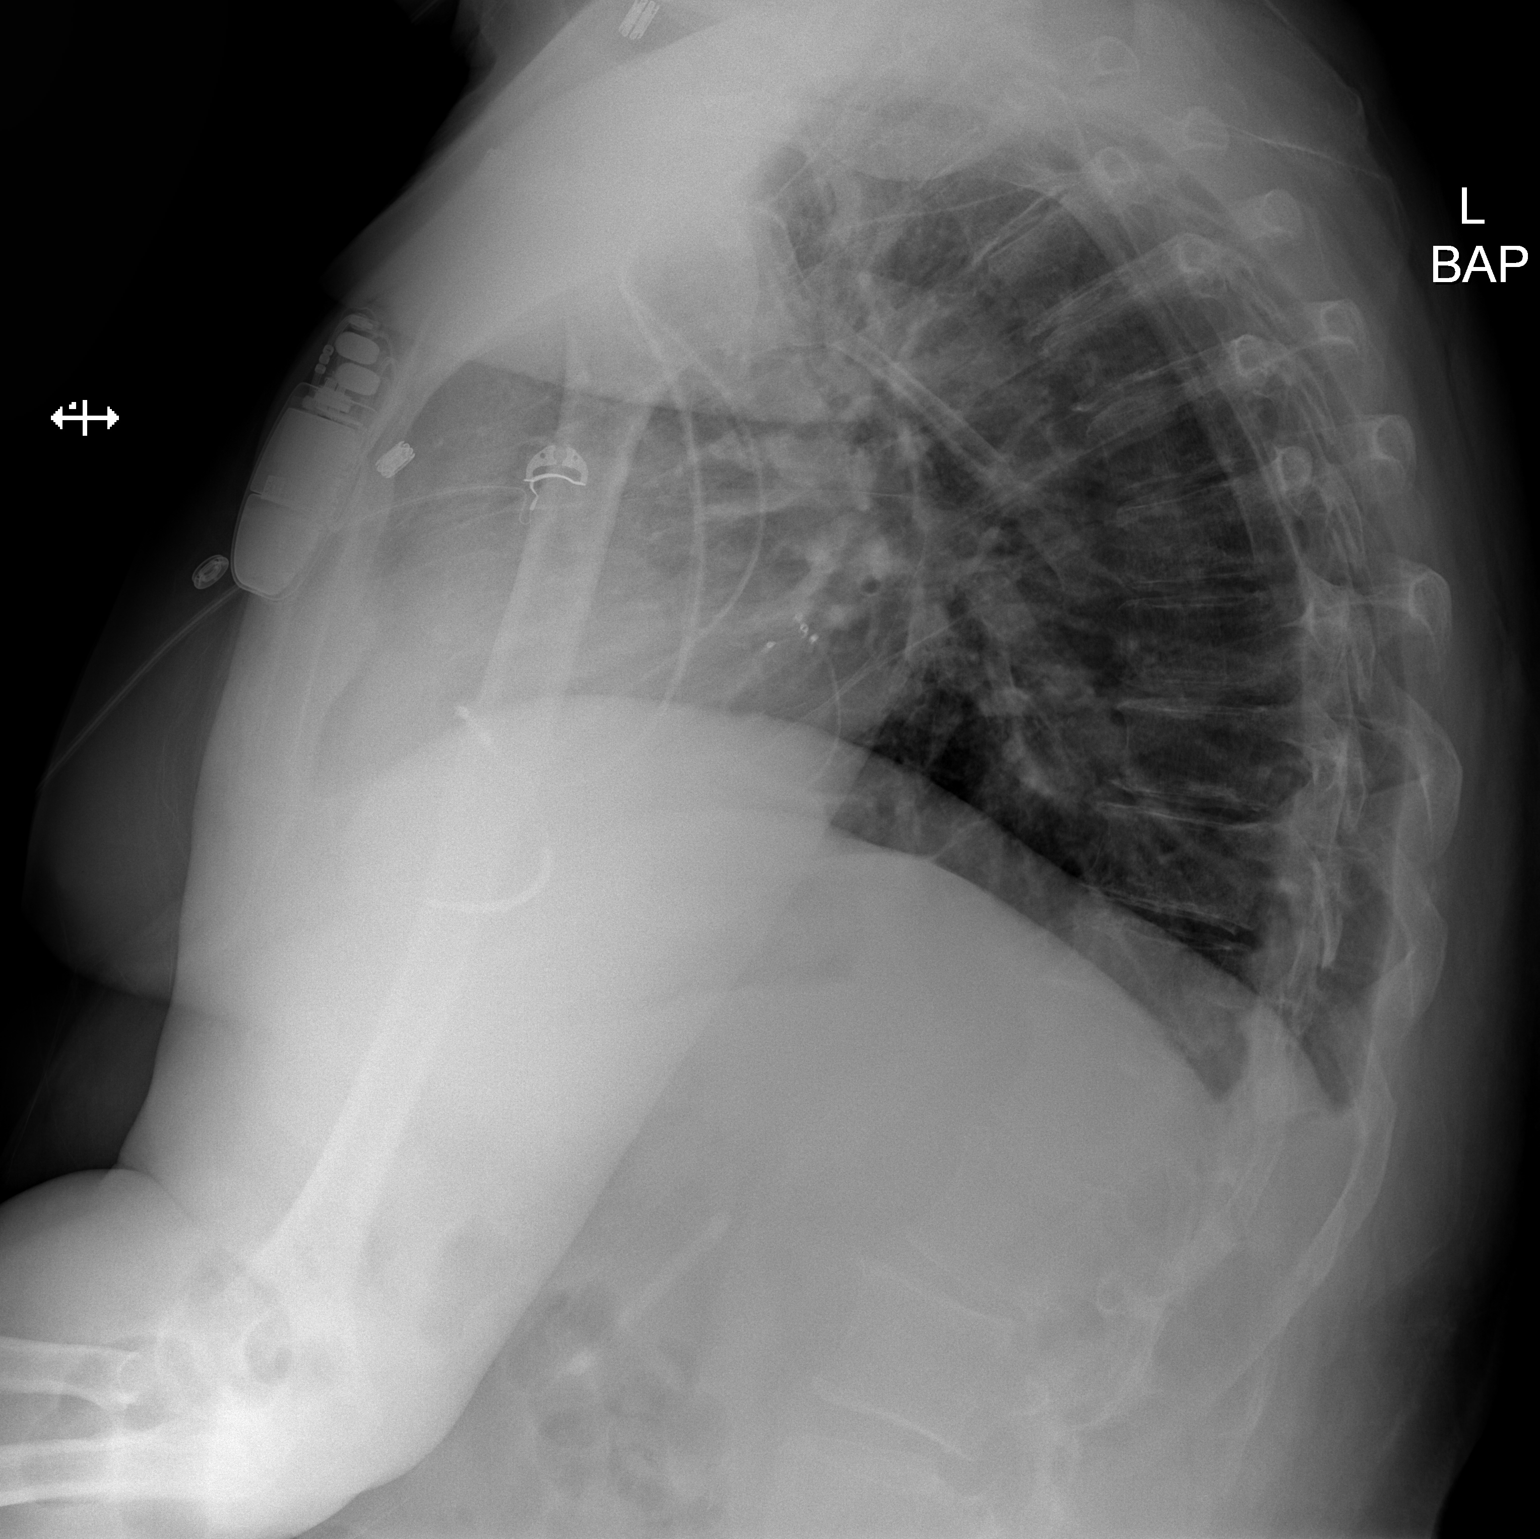

[2 of 2 positions shown; findings below may reference images not displayed]

FINDINGS: Cardiac shadow is enlarged but stable. Defibrillator is now seen in
satisfactory position. No pneumothorax is noted. The lungs are
clear. No bony abnormality seen.
IMPRESSION: No pneumothorax following defibrillator placement.

## 2021-02-05 ENCOUNTER — Ambulatory Visit (INDEPENDENT_AMBULATORY_CARE_PROVIDER_SITE_OTHER): Payer: BC Managed Care – PPO

## 2021-02-05 ENCOUNTER — Other Ambulatory Visit: Payer: Self-pay

## 2021-02-05 DIAGNOSIS — R079 Chest pain, unspecified: Secondary | ICD-10-CM | POA: Diagnosis not present

## 2021-02-05 LAB — MYOCARDIAL PERFUSION IMAGING
LV dias vol: 146 mL (ref 62–150)
LV sys vol: 69 mL
Nuc Stress EF: 53 %
Peak HR: 83 {beats}/min
Rest HR: 60 {beats}/min
Rest Nuclear Isotope Dose: 10.3 mCi
SDS: 3
SRS: 8
SSS: 11
ST Depression (mm): 0 mm
Stress Nuclear Isotope Dose: 29.7 mCi
TID: 1.06

## 2021-02-05 MED ORDER — REGADENOSON 0.4 MG/5ML IV SOLN
0.4000 mg | Freq: Once | INTRAVENOUS | Status: AC
  Administered 2021-02-05: 0.4 mg via INTRAVENOUS

## 2021-02-05 MED ORDER — TECHNETIUM TC 99M TETROFOSMIN IV KIT
10.3000 | PACK | Freq: Once | INTRAVENOUS | Status: AC | PRN
  Administered 2021-02-05: 10.3 via INTRAVENOUS

## 2021-02-05 MED ORDER — TECHNETIUM TC 99M TETROFOSMIN IV KIT
29.7000 | PACK | Freq: Once | INTRAVENOUS | Status: AC | PRN
  Administered 2021-02-05: 29.7 via INTRAVENOUS

## 2021-02-09 ENCOUNTER — Other Ambulatory Visit: Payer: Self-pay | Admitting: Cardiology

## 2021-02-11 ENCOUNTER — Other Ambulatory Visit: Payer: Self-pay

## 2021-02-11 ENCOUNTER — Ambulatory Visit: Payer: BC Managed Care – PPO | Admitting: Cardiology

## 2021-02-11 ENCOUNTER — Encounter: Payer: Self-pay | Admitting: Cardiology

## 2021-02-11 VITALS — BP 120/82 | HR 69 | Ht 74.0 in | Wt 273.0 lb

## 2021-02-11 DIAGNOSIS — R7303 Prediabetes: Secondary | ICD-10-CM

## 2021-02-11 DIAGNOSIS — I428 Other cardiomyopathies: Secondary | ICD-10-CM | POA: Diagnosis not present

## 2021-02-11 DIAGNOSIS — E785 Hyperlipidemia, unspecified: Secondary | ICD-10-CM

## 2021-02-11 DIAGNOSIS — Z6835 Body mass index (BMI) 35.0-35.9, adult: Secondary | ICD-10-CM

## 2021-02-11 DIAGNOSIS — I10 Essential (primary) hypertension: Secondary | ICD-10-CM | POA: Diagnosis not present

## 2021-02-11 MED ORDER — ATORVASTATIN CALCIUM 10 MG PO TABS: 10.0000 mg | ORAL_TABLET | Freq: Every day | ORAL | 3 refills | Status: DC

## 2021-02-11 MED ORDER — ASPIRIN EC 81 MG PO TBEC
81.0000 mg | DELAYED_RELEASE_TABLET | Freq: Every day | ORAL | 3 refills | Status: AC
Start: 2021-02-11 — End: ?

## 2021-02-11 NOTE — H&P (View-Only) (Signed)
Cardiology Office Note:    Date:  02/11/2021   ID:  Raymond Alvarez, DOB 1955-06-14, MRN [id number]  PCP:  Raina Mina., MD  Cardiologist:  Jenne Campus, MD    Referring MD: Raina Mina., MD   Chief Complaint  Patient presents with   Results  I am doing fine I am here to talk about abnormal stress test  History of Present Illness:    Zarius Furr is a 66 y.o. 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  However, recent echocardiogram shows segmental motion abnormalities which prompted stress testing which showed gross abnormality.  Stress tissue ischemia involving inferior wall.  I brought him today to my office to talk about potentially doing cardiac catheterization.  He does have BiV ICD which is Abbott device functioning properly, benign essential hypertension, prediabetes, dyslipidemia with direct LDL recently checked 106. Overall he is doing quite well denies have any chest pain tightness squeezing pressure burning chest.  Sometimes he is tired and exhausted but no otherwise no cardiac complaints.  Past Medical History:  Diagnosis Date   Anemia    Anxiety    Anxiety disorder 03/20/2015   Asthma    Benign essential hypertension 03/20/2015   Last Assessment & Plan:  Relevant Hx: Course: Daily Update: Today's Plan:this appears stable overall  Electronically signed by: Baldemar Friday, [place] 03/20/15 1509  Last Assessment & Plan:  Formatting of this note might be different from the original. 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   Palpitations 03/20/2015   Prediabetes 06/17/2018   Prostate cancer ([place])    Pulmonary nodule 05/12/2018   PVC (premature  ventricular contraction) 03/20/2015   Rhabdomyolysis 05/12/2018   Seasonal allergies    Suspected COVID-19 virus infection 05/12/2018   Viral gastroenteritis 05/12/2018   Wears glasses     Past Surgical History:  Procedure Laterality Date   APPENDECTOMY  1970   BIV ICD INSERTION CRT-D N/A 01/13/2019   Procedure: BIV ICD INSERTION  CRT-D;  Surgeon: Constance Haw, MD;  Location: [place] [facility];  Service: Cardiovascular;  Laterality: N/A;   COLONOSCOPY  09/24/2016   Colonic polyp status post polypectomy. Tubular adenoma.    ESOPHAGOGASTRODUODENOSCOPY  12/10/2016   Small hiatal hernia. Mild gastritis. Gastric polyps status post polypectomy x 4    MASS EXCISION Left 12/15/2013   Procedure: EXCISION MUCOID TUMOR LEFT INDEX DEBRIDEMENT DISTAL INTERPHALANGEAL JOINT LEFT INDEX FINGER;  Surgeon: Daryll Brod, MD;  Location: [place];  Service: Orthopedics;  Laterality: Left;   PROSTATECTOMY  2015   TONSILLECTOMY      Current Medications: Current Meds  Medication Sig   Albuterol Sulfate (PROAIR RESPICLICK) 557 (90 Base) MCG/ACT AEPB Inhale 1-2 puffs into the lungs every 6 (six) hours as needed (wheezing/shortness of breath).   allopurinol (ZYLOPRIM) 300 MG tablet Take 300 mg by mouth daily.   Black Pepper-Turmeric 3-500 MG CAPS Take 500 mg by mouth daily.   carvedilol (COREG) 12.5 MG tablet Take 12.5 mg by mouth 2 (two) times daily with a meal.   cyanocobalamin (,VITAMIN B-12,) 1000 MCG/ML injection Inject 1,000 mcg into the muscle every 30 (thirty) days.   diclofenac Sodium (VOLTAREN) 1 % GEL Apply 1 application topically daily. Applied to right knee   dicyclomine (BENTYL) 10 MG capsule Take 1 capsule (10 mg total) by mouth 4 (four) times daily -  before meals and at bedtime.   ferrous sulfate 324 MG TBEC Take 324 mg by mouth every Monday, Wednesday, and Friday.   fluticasone (FLONASE) 50 MCG/ACT nasal spray Place 1 spray into both nostrils daily.   GNP GARLIC EXTRACT PO Take 322 mg by mouth daily. Unknown strength   hyoscyamine (LEVSIN SL) 0.125 MG SL tablet Place 1 tablet (0.125 mg total) under the tongue every 4 (four) hours as needed.   LORazepam (ATIVAN) 1 MG tablet Take 1 mg by mouth daily as needed for anxiety.   methocarbamol (ROBAXIN) 500 MG tablet Take 500 mg by mouth 4 (four) times daily as  needed for muscle spasms.   olopatadine (PATANOL) 0.1 % ophthalmic solution Place 1 drop into both eyes daily.   omeprazole (PRILOSEC) 20 MG capsule Take 20 mg by mouth daily.   OVER THE COUNTER MEDICATION Take 400 mg by mouth 2 (two) times daily. Parsley Leaves   Probiotic Product (PROBIOTIC PO) Take 1 capsule by mouth daily. Unknown strength   pyridOXINE (VITAMIN B-6) 100 MG tablet Take 200 mg by mouth daily.   sacubitril-valsartan (ENTRESTO) 49-51 MG Take 1 tablet by mouth 2 (two) times daily.   spironolactone (ALDACTONE) 25 MG tablet Take 25 mg by mouth daily.    Turmeric 500 MG TABS Take 500 mg by mouth daily.   vitamin C (VITAMIN C) 500 MG tablet Take 1 tablet (500 mg total) by mouth daily.   vortioxetine HBr (TRINTELLIX) 20 MG TABS tablet Take 20 mg by mouth every evening.    zinc sulfate 220 (50 Zn) MG capsule Take 1 capsule (220 mg total) by mouth daily. (Patient taking differently: Take 50 mg by mouth daily.)     Allergies:   Other and Sulfa antibiotics  Social History   Socioeconomic History   Marital status: Married    Spouse name: Not on file   Number of children: 2   Years of education: Not on file   Highest education level: Not on file  Occupational History   Occupation: Sport and exercise psychologist  Tobacco Use   Smoking status: Never   Smokeless tobacco: Never  Vaping Use   Vaping Use: Never used  Substance and Sexual Activity   Alcohol use: Yes    Comment: occ   Drug use: No   Sexual activity: Not on file  Other Topics Concern   Not on file  Social History Narrative   Not on file   Social Determinants of Health   Financial Resource Strain: Not on file  Food Insecurity: Not on file  Transportation Needs: Not on file  Physical Activity: Not on file  Stress: Not on file  Social Connections: Not on file     Family History: The patient's family history includes Breast cancer in his mother; Prostate cancer in his father and maternal grandfather. There is no  history of Colon cancer. ROS:   Please see the history of present illness.    All 14 point review of systems negative except as described per history of present illness  EKGs/Labs/Other Studies Reviewed:      Recent Labs: No results found for requested labs within last 8760 hours.  Recent Lipid Panel No results found for: CHOL, TRIG, HDL, CHOLHDL, VLDL, LDLCALC, LDLDIRECT  Physical Exam:    VS:  BP 120/82 (BP Location: Right Arm, Patient Position: Sitting)    Pulse 69    Ht 6\' 2"  (1.88 m)    Wt 273 lb (123.8 kg)    SpO2 97%    BMI 35.05 kg/m     Wt Readings from Last 3 Encounters:  02/11/21 273 lb (123.8 kg)  02/05/21 270 lb (122.5 kg)  01/16/21 270 lb (122.5 kg)     GEN:  Well nourished, well developed in no acute distress HEENT: Normal NECK: No JVD; No carotid bruits LYMPHATICS: No lymphadenopathy CARDIAC: RRR, no murmurs, no rubs, no gallops RESPIRATORY:  Clear to auscultation without rales, wheezing or rhonchi  ABDOMEN: Soft, non-tender, non-distended MUSCULOSKELETAL:  No edema; No deformity  SKIN: Warm and dry LOWER EXTREMITIES: no swelling NEUROLOGIC:  Alert and oriented x 3 PSYCHIATRIC:  Normal affect   ASSESSMENT:    1. Nonischemic cardiomyopathy ([place])   2. Benign essential hypertension   3. Class 2 severe obesity due to excess calories with serious comorbidity and body mass index (BMI) of 35.0 to 35.9 in adult ([place])   4. Prediabetes    PLAN:    In order of problems listed above:  Nonischemic cardiomyopathy I am worried about potential ischemic etiology of this phenomenon.  His echocardiogram shows segmental wall motion abnormality, stress test showing ischemia involving inferior wall.  Therefore cardiac catheterization will be performed.  Procedure was explained to him including all risk benefits as well as alternatives.  He agreed to proceed.  He did meantime I will put him on antiplatelet therapy.  We will initiate aspirin.  I will still initiate statin.   Been Benign essential hypertension.  Doing well from that point review, his blood pressure is well controlled we will continue present management. Obesity still a problem but actually getting better from that point review. Diabetes we will check hemoglobin A1c.   Medication Adjustments/Labs and Tests Ordered: Current medicines are reviewed at length with the patient today.  Concerns regarding medicines are outlined above.  No orders of the defined types were placed in this encounter.  Medication changes: No orders of the defined types were placed in this encounter.   Signed, Park Liter, MD, [facility] 02/11/2021 10:19 AM    [place]

## 2021-02-11 NOTE — Progress Notes (Signed)
Cardiology Office Note:    Date:  02/11/2021   ID:  Raymond Alvarez, DOB Oct 16, 1955, MRN [id number]  PCP:  Raina Mina., MD  Cardiologist:  Jenne Campus, MD    Referring MD: Raina Mina., MD   Chief Complaint  Patient presents with   Results  I am doing fine I am here to talk about abnormal stress test  History of Present Illness:    Raymond Alvarez is a 66 y.o. 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  However, recent echocardiogram shows segmental motion abnormalities which prompted stress testing which showed gross abnormality.  Stress tissue ischemia involving inferior wall.  I brought him today to my office to talk about potentially doing cardiac catheterization.  He does have BiV ICD which is Abbott device functioning properly, benign essential hypertension, prediabetes, dyslipidemia with direct LDL recently checked 106. Overall he is doing quite well denies have any chest pain tightness squeezing pressure burning chest.  Sometimes he is tired and exhausted but no otherwise no cardiac complaints.  Past Medical History:  Diagnosis Date   Anemia    Anxiety    Anxiety disorder 03/20/2015   Asthma    Benign essential hypertension 03/20/2015   Last Assessment & Plan:  Relevant Hx: Course: Daily Update: Today's Plan:this appears stable overall  Electronically signed by: Baldemar Friday, [place] 03/20/15 1509  Last Assessment & Plan:  Formatting of this note might be different from the original. 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   Palpitations 03/20/2015   Prediabetes 06/17/2018   Prostate cancer ([place])    Pulmonary nodule 05/12/2018   PVC (premature  ventricular contraction) 03/20/2015   Rhabdomyolysis 05/12/2018   Seasonal allergies    Suspected COVID-19 virus infection 05/12/2018   Viral gastroenteritis 05/12/2018   Wears glasses     Past Surgical History:  Procedure Laterality Date   APPENDECTOMY  1970   BIV ICD INSERTION CRT-D N/A 01/13/2019   Procedure: BIV ICD INSERTION  CRT-D;  Surgeon: Constance Haw, MD;  Location: [place] [facility];  Service: Cardiovascular;  Laterality: N/A;   COLONOSCOPY  09/24/2016   Colonic polyp status post polypectomy. Tubular adenoma.    ESOPHAGOGASTRODUODENOSCOPY  12/10/2016   Small hiatal hernia. Mild gastritis. Gastric polyps status post polypectomy x 4    MASS EXCISION Left 12/15/2013   Procedure: EXCISION MUCOID TUMOR LEFT INDEX DEBRIDEMENT DISTAL INTERPHALANGEAL JOINT LEFT INDEX FINGER;  Surgeon: Daryll Brod, MD;  Location: [place];  Service: Orthopedics;  Laterality: Left;   PROSTATECTOMY  2015   TONSILLECTOMY      Current Medications: Current Meds  Medication Sig   Albuterol Sulfate (PROAIR RESPICLICK) 510 (90 Base) MCG/ACT AEPB Inhale 1-2 puffs into the lungs every 6 (six) hours as needed (wheezing/shortness of breath).   allopurinol (ZYLOPRIM) 300 MG tablet Take 300 mg by mouth daily.   Black Pepper-Turmeric 3-500 MG CAPS Take 500 mg by mouth daily.   carvedilol (COREG) 12.5 MG tablet Take 12.5 mg by mouth 2 (two) times daily with a meal.   cyanocobalamin (,VITAMIN B-12,) 1000 MCG/ML injection Inject 1,000 mcg into the muscle every 30 (thirty) days.   diclofenac Sodium (VOLTAREN) 1 % GEL Apply 1 application topically daily. Applied to right knee   dicyclomine (BENTYL) 10 MG capsule Take 1 capsule (10 mg total) by mouth 4 (four) times daily -  before meals and at bedtime.   ferrous sulfate 324 MG TBEC Take 324 mg by mouth every Monday, Wednesday, and Friday.   fluticasone (FLONASE) 50 MCG/ACT nasal spray Place 1 spray into both nostrils daily.   GNP GARLIC EXTRACT PO Take 258 mg by mouth daily. Unknown strength   hyoscyamine (LEVSIN SL) 0.125 MG SL tablet Place 1 tablet (0.125 mg total) under the tongue every 4 (four) hours as needed.   LORazepam (ATIVAN) 1 MG tablet Take 1 mg by mouth daily as needed for anxiety.   methocarbamol (ROBAXIN) 500 MG tablet Take 500 mg by mouth 4 (four) times daily as  needed for muscle spasms.   olopatadine (PATANOL) 0.1 % ophthalmic solution Place 1 drop into both eyes daily.   omeprazole (PRILOSEC) 20 MG capsule Take 20 mg by mouth daily.   OVER THE COUNTER MEDICATION Take 400 mg by mouth 2 (two) times daily. Parsley Leaves   Probiotic Product (PROBIOTIC PO) Take 1 capsule by mouth daily. Unknown strength   pyridOXINE (VITAMIN B-6) 100 MG tablet Take 200 mg by mouth daily.   sacubitril-valsartan (ENTRESTO) 49-51 MG Take 1 tablet by mouth 2 (two) times daily.   spironolactone (ALDACTONE) 25 MG tablet Take 25 mg by mouth daily.    Turmeric 500 MG TABS Take 500 mg by mouth daily.   vitamin C (VITAMIN C) 500 MG tablet Take 1 tablet (500 mg total) by mouth daily.   vortioxetine HBr (TRINTELLIX) 20 MG TABS tablet Take 20 mg by mouth every evening.    zinc sulfate 220 (50 Zn) MG capsule Take 1 capsule (220 mg total) by mouth daily. (Patient taking differently: Take 50 mg by mouth daily.)     Allergies:   Other and Sulfa antibiotics  Social History   Socioeconomic History   Marital status: Married    Spouse name: Not on file   Number of children: 2   Years of education: Not on file   Highest education level: Not on file  Occupational History   Occupation: Sport and exercise psychologist  Tobacco Use   Smoking status: Never   Smokeless tobacco: Never  Vaping Use   Vaping Use: Never used  Substance and Sexual Activity   Alcohol use: Yes    Comment: occ   Drug use: No   Sexual activity: Not on file  Other Topics Concern   Not on file  Social History Narrative   Not on file   Social Determinants of Health   Financial Resource Strain: Not on file  Food Insecurity: Not on file  Transportation Needs: Not on file  Physical Activity: Not on file  Stress: Not on file  Social Connections: Not on file     Family History: The patient's family history includes Breast cancer in his mother; Prostate cancer in his father and maternal grandfather. There is no  history of Colon cancer. ROS:   Please see the history of present illness.    All 14 point review of systems negative except as described per history of present illness  EKGs/Labs/Other Studies Reviewed:      Recent Labs: No results found for requested labs within last 8760 hours.  Recent Lipid Panel No results found for: CHOL, TRIG, HDL, CHOLHDL, VLDL, LDLCALC, LDLDIRECT  Physical Exam:    VS:  BP 120/82 (BP Location: Right Arm, Patient Position: Sitting)    Pulse 69    Ht 6\' 2"  (1.88 m)    Wt 273 lb (123.8 kg)    SpO2 97%    BMI 35.05 kg/m     Wt Readings from Last 3 Encounters:  02/11/21 273 lb (123.8 kg)  02/05/21 270 lb (122.5 kg)  01/16/21 270 lb (122.5 kg)     GEN:  Well nourished, well developed in no acute distress HEENT: Normal NECK: No JVD; No carotid bruits LYMPHATICS: No lymphadenopathy CARDIAC: RRR, no murmurs, no rubs, no gallops RESPIRATORY:  Clear to auscultation without rales, wheezing or rhonchi  ABDOMEN: Soft, non-tender, non-distended MUSCULOSKELETAL:  No edema; No deformity  SKIN: Warm and dry LOWER EXTREMITIES: no swelling NEUROLOGIC:  Alert and oriented x 3 PSYCHIATRIC:  Normal affect   ASSESSMENT:    1. Nonischemic cardiomyopathy ([place])   2. Benign essential hypertension   3. Class 2 severe obesity due to excess calories with serious comorbidity and body mass index (BMI) of 35.0 to 35.9 in adult ([place])   4. Prediabetes    PLAN:    In order of problems listed above:  Nonischemic cardiomyopathy I am worried about potential ischemic etiology of this phenomenon.  His echocardiogram shows segmental wall motion abnormality, stress test showing ischemia involving inferior wall.  Therefore cardiac catheterization will be performed.  Procedure was explained to him including all risk benefits as well as alternatives.  He agreed to proceed.  He did meantime I will put him on antiplatelet therapy.  We will initiate aspirin.  I will still initiate statin.   Been Benign essential hypertension.  Doing well from that point review, his blood pressure is well controlled we will continue present management. Obesity still a problem but actually getting better from that point review. Diabetes we will check hemoglobin A1c.   Medication Adjustments/Labs and Tests Ordered: Current medicines are reviewed at length with the patient today.  Concerns regarding medicines are outlined above.  No orders of the defined types were placed in this encounter.  Medication changes: No orders of the defined types were placed in this encounter.   Signed, Park Liter, MD, [facility] 02/11/2021 10:19 AM    [place]

## 2021-02-11 NOTE — Patient Instructions (Signed)
Medication Instructions:  Your physician has recommended you make the following change in your medication:  START: Aspirin 81 mg take one tablet by mouth daily.  START: Atorvastatin 10 mg take one tablet by mouth daily in the evening.  *If you need a refill on your cardiac medications before your next appointment, please call your pharmacy*   Lab Work: Your physician recommends that you return for lab work in: TODAY CBC, BMP, Lipids If you have labs (blood work) drawn today and your tests are completely normal, you will receive your results only by: [place] (if you have [place]) OR A paper copy in the mail If you have any lab test that is abnormal or we need to change your treatment, we will call you to review the results.   Testing/Procedures:  [place] [facility] [id number] Dept: [phone] Loc: [place]  02/11/2021  You are scheduled for a Cardiac Catheterization on Thursday, January 12 with Dr. Larae Grooms.  1. Please arrive at the [facility] (Main Entrance A) at [facility]: [address] [place], [place] [address] at 8:30 AM (This time is two hours before your procedure to ensure your preparation). Free valet parking service is available.   Special note: Every effort is made to have your procedure done on time. Please understand that emergencies sometimes delay scheduled procedures.  2. Diet: Do not eat solid foods after midnight.  The patient may have clear liquids until 5am upon the day of the procedure.  3. Labs: YOU HAD YOUR LABS COMPLETED TODAY 02/11/2021  4. Medication instructions in preparation for your procedure:   Contrast Allergy: No   On the morning of your procedure, take your Aspirin and any morning medicines NOT listed above.  You may use sips of water.  5. Plan for one night stay--bring personal belongings. 6.  Bring a current list of your medications and current insurance cards. 7. You MUST have a responsible person to drive you home. 8. Someone MUST be with you the first 24 hours after you arrive home or your discharge will be delayed. 9. Please wear clothes that are easy to get on and off and wear slip-on shoes.  Thank you for allowing [place] to care for you!   -- [place] [facility]    Follow-Up: At [facility], you and your health needs are our priority.  As part of our continuing mission to provide you with exceptional heart care, we have created designated Provider Care Teams.  These Care Teams include your primary Cardiologist (physician) and Advanced Practice Providers (APPs -  Physician Assistants and Nurse Practitioners) who all work together to provide you with the care you need, when you need it.  We recommend signing up for the patient portal called "MyChart".  Sign up information is provided on this After Visit Summary.  MyChart is used to connect with patients for Virtual Visits (Telemedicine).  Patients are able to view lab/test results, encounter notes, upcoming appointments, etc.  Non-urgent messages can be sent to your provider as well.   To learn more about what you can do with MyChart, go to [email or web address].    Your next appointment:   6 week(s)  The format for your next appointment:   In Person  Provider:   Jenne Campus, MD    Other Instructions

## 2021-02-12 LAB — BASIC METABOLIC PANEL
BUN/Creatinine Ratio: 17 (ref 10–24)
BUN: 20 mg/dL (ref 8–27)
CO2: 21 mmol/L (ref 20–29)
Calcium: 9.3 mg/dL (ref 8.6–10.2)
Chloride: 102 mmol/L (ref 96–106)
Creatinine, Ser: 1.15 mg/dL (ref 0.76–1.27)
Glucose: 95 mg/dL (ref 70–99)
Potassium: 4.8 mmol/L (ref 3.5–5.2)
Sodium: 137 mmol/L (ref 134–144)
eGFR: 72 mL/min/{1.73_m2} (ref >59)

## 2021-02-12 LAB — CBC
Hematocrit: 42.6 % (ref 37.5–51.0)
Hemoglobin: 14.1 g/dL (ref 13.0–17.7)
MCH: 30.1 pg (ref 26.6–33.0)
MCHC: 33.1 g/dL (ref 31.5–35.7)
MCV: 91 fL (ref 79–97)
Platelets: 238 10*3/uL (ref 150–450)
RBC: 4.68 x10E6/uL (ref 4.14–5.80)
RDW: 13.2 % (ref 11.6–15.4)
WBC: 8.1 10*3/uL (ref 3.4–10.8)

## 2021-02-12 LAB — LIPID PANEL
Chol/HDL Ratio: 5.3 ratio — ABNORMAL HIGH (ref 0.0–5.0)
Cholesterol, Total: 169 mg/dL (ref 100–199)
HDL: 32 mg/dL — ABNORMAL LOW (ref >39)
LDL Chol Calc (NIH): 111 mg/dL — ABNORMAL HIGH (ref 0–99)
Triglycerides: 142 mg/dL (ref 0–149)
VLDL Cholesterol Cal: 26 mg/dL (ref 5–40)

## 2021-02-12 LAB — LDL CHOLESTEROL, DIRECT: LDL Direct: 108 mg/dL — ABNORMAL HIGH (ref 0–99)

## 2021-02-13 ENCOUNTER — Telehealth: Payer: Self-pay | Admitting: *Deleted

## 2021-02-13 NOTE — Telephone Encounter (Signed)
Cardiac catheterization scheduled at [facility] for: Thursday February 14, 2021 10:30 AM [facility] Main Entrance A [facility]) at: 8:30 AM   Diet-no solid food after midnight prior to cath, clear liquids until 5 AM day of procedure.  Medication instructions for procedure: -Hold:   Spironolactone-AM of procedure -Except hold medications usual morning medications can be taken pre-cath with sips of water including aspirin 81 mg.    Confirmed patient has responsible adult to drive home post procedure and be with patient first 24 hours after arriving home.  [facility] does allow one visitor to accompany you and wait in the hospital waiting room while you are there for your procedure. You and your visitor will be asked to wear a mask once you enter the hospital.   Patient reports does not currently have any new symptoms concerning for COVID-19 and no household members with COVID-19 like illness.     Reviewed procedure/mask/visitor instructions with patient.

## 2021-02-14 ENCOUNTER — Other Ambulatory Visit: Payer: Self-pay

## 2021-02-14 ENCOUNTER — Ambulatory Visit (HOSPITAL_COMMUNITY)
Admission: RE | Admit: 2021-02-14 | Discharge: 2021-02-14 | Disposition: A | Discharge status: Home / Self Care | Payer: BC Managed Care – PPO | Attending: Interventional Cardiology | Admitting: Interventional Cardiology

## 2021-02-14 ENCOUNTER — Ambulatory Visit (HOSPITAL_COMMUNITY)
Admission: RE | Disposition: A | Discharge status: Home / Self Care | Payer: Self-pay | Source: Home / Self Care | Attending: Interventional Cardiology

## 2021-02-14 DIAGNOSIS — I5022 Chronic systolic (congestive) heart failure: Secondary | ICD-10-CM | POA: Insufficient documentation

## 2021-02-14 DIAGNOSIS — I130 Hypertensive heart and chronic kidney disease with heart failure and stage 1 through stage 4 chronic kidney disease, or unspecified chronic kidney disease: Secondary | ICD-10-CM | POA: Diagnosis not present

## 2021-02-14 DIAGNOSIS — I428 Other cardiomyopathies: Secondary | ICD-10-CM | POA: Diagnosis present

## 2021-02-14 DIAGNOSIS — N189 Chronic kidney disease, unspecified: Secondary | ICD-10-CM | POA: Diagnosis not present

## 2021-02-14 DIAGNOSIS — I2510 Atherosclerotic heart disease of native coronary artery without angina pectoris: Secondary | ICD-10-CM | POA: Diagnosis not present

## 2021-02-14 DIAGNOSIS — R7303 Prediabetes: Secondary | ICD-10-CM | POA: Diagnosis not present

## 2021-02-14 DIAGNOSIS — R9439 Abnormal result of other cardiovascular function study: Secondary | ICD-10-CM

## 2021-02-14 HISTORY — PX: LEFT HEART CATH AND CORONARY ANGIOGRAPHY: CATH118249

## 2021-02-14 SURGERY — LEFT HEART CATH AND CORONARY ANGIOGRAPHY
Anesthesia: LOCAL

## 2021-02-14 MED ORDER — LIDOCAINE HCL (PF) 1 % IJ SOLN
INTRAMUSCULAR | Status: DC | PRN
  Administered 2021-02-14: 5 mL

## 2021-02-14 MED ORDER — HEPARIN (PORCINE) IN NACL 1000-0.9 UT/500ML-% IV SOLN
INTRAVENOUS | Status: AC
  Filled 2021-02-14: qty 500

## 2021-02-14 MED ORDER — SODIUM CHLORIDE 0.9% FLUSH: 3.0000 mL | INTRAVENOUS | Status: DC | PRN

## 2021-02-14 MED ORDER — HEPARIN SODIUM (PORCINE) 1000 UNIT/ML IJ SOLN
INTRAMUSCULAR | Status: DC | PRN
  Administered 2021-02-14: 5000 [IU] via INTRAVENOUS

## 2021-02-14 MED ORDER — FENTANYL CITRATE (PF) 100 MCG/2ML IJ SOLN
INTRAMUSCULAR | Status: DC | PRN
  Administered 2021-02-14 (×2): 25 ug via INTRAVENOUS

## 2021-02-14 MED ORDER — IOHEXOL 350 MG/ML SOLN
INTRAVENOUS | Status: DC | PRN
  Administered 2021-02-14: 50 mL via INTRA_ARTERIAL

## 2021-02-14 MED ORDER — VERAPAMIL HCL 2.5 MG/ML IV SOLN
INTRAVENOUS | Status: AC
  Filled 2021-02-14: qty 2

## 2021-02-14 MED ORDER — LABETALOL HCL 5 MG/ML IV SOLN: 10.0000 mg | INTRAVENOUS | Status: DC | PRN

## 2021-02-14 MED ORDER — SODIUM CHLORIDE 0.9 % IV SOLN: 250.0000 mL | INTRAVENOUS | Status: DC | PRN

## 2021-02-14 MED ORDER — ACETAMINOPHEN 325 MG PO TABS: 650.0000 mg | ORAL_TABLET | ORAL | Status: DC | PRN

## 2021-02-14 MED ORDER — HYDRALAZINE HCL 20 MG/ML IJ SOLN: 10.0000 mg | INTRAMUSCULAR | Status: DC | PRN

## 2021-02-14 MED ORDER — SODIUM CHLORIDE 0.9% FLUSH: 3.0000 mL | Freq: Two times a day (BID) | INTRAVENOUS | Status: DC

## 2021-02-14 MED ORDER — HEPARIN (PORCINE) IN NACL 1000-0.9 UT/500ML-% IV SOLN
INTRAVENOUS | Status: DC | PRN
  Administered 2021-02-14 (×2): 500 mL

## 2021-02-14 MED ORDER — MIDAZOLAM HCL 2 MG/2ML IJ SOLN
INTRAMUSCULAR | Status: AC
  Filled 2021-02-14: qty 2

## 2021-02-14 MED ORDER — VERAPAMIL HCL 2.5 MG/ML IV SOLN
INTRAVENOUS | Status: DC | PRN
  Administered 2021-02-14 (×2): 10 mL via INTRA_ARTERIAL

## 2021-02-14 MED ORDER — LIDOCAINE HCL (PF) 1 % IJ SOLN
INTRAMUSCULAR | Status: AC
  Filled 2021-02-14: qty 30

## 2021-02-14 MED ORDER — ASPIRIN 81 MG PO CHEW: 81.0000 mg | CHEWABLE_TABLET | ORAL | Status: DC

## 2021-02-14 MED ORDER — SODIUM CHLORIDE 0.9 % IV SOLN: INTRAVENOUS | Status: DC

## 2021-02-14 MED ORDER — ONDANSETRON HCL 4 MG/2ML IJ SOLN: 4.0000 mg | Freq: Four times a day (QID) | INTRAMUSCULAR | Status: DC | PRN

## 2021-02-14 MED ORDER — MIDAZOLAM HCL 2 MG/2ML IJ SOLN
INTRAMUSCULAR | Status: DC | PRN
  Administered 2021-02-14: 1 mg via INTRAVENOUS
  Administered 2021-02-14: 2 mg via INTRAVENOUS

## 2021-02-14 MED ORDER — FENTANYL CITRATE (PF) 100 MCG/2ML IJ SOLN
INTRAMUSCULAR | Status: AC
  Filled 2021-02-14: qty 2

## 2021-02-14 MED ORDER — HEPARIN SODIUM (PORCINE) 1000 UNIT/ML IJ SOLN
INTRAMUSCULAR | Status: AC
  Filled 2021-02-14: qty 10

## 2021-02-14 SURGICAL SUPPLY — 13 items
CATH 5FR JL3.5 JR4 ANG PIG MP (CATHETERS) ×1 IMPLANT
CATH INFINITI 5 FR AR1 MOD (CATHETERS) ×1 IMPLANT
CATH INFINITI 5FR AL1 (CATHETERS) ×1 IMPLANT
DEVICE RAD COMP TR BAND LRG (VASCULAR PRODUCTS) ×1 IMPLANT
GLIDESHEATH SLEND SS 6F .021 (SHEATH) ×1 IMPLANT
GUIDEWIRE INQWIRE 1.5J.035X260 (WIRE) IMPLANT
INQWIRE 1.5J .035X260CM (WIRE) ×2
KIT HEART LEFT (KITS) ×2 IMPLANT
PACK CARDIAC CATHETERIZATION (CUSTOM PROCEDURE TRAY) ×2 IMPLANT
SHEATH PROBE COVER 6X72 (BAG) ×1 IMPLANT
TRANSDUCER W/STOPCOCK (MISCELLANEOUS) ×2 IMPLANT
TUBING CIL FLEX 10 FLL-RA (TUBING) ×2 IMPLANT
WIRE HI TORQ VERSACORE-J 145CM (WIRE) ×1 IMPLANT

## 2021-02-14 NOTE — Interval H&P Note (Signed)
Cath Lab Visit (complete for each Cath Lab visit)  Clinical Evaluation Leading to the Procedure:   ACS: No.  Non-ACS:    Anginal Classification: CCS II  Anti-ischemic medical therapy: Minimal Therapy (1 class of medications)  Non-Invasive Test Results: High-risk stress test findings: cardiac mortality >3%/year  Prior CABG: No previous CABG   Decreased LV function   History and Physical Interval Note:  02/14/2021 10:56 AM  Raymond Alvarez  has presented today for surgery, with the diagnosis of chest pain - cad.  The various methods of treatment have been discussed with the patient and family. After consideration of risks, benefits and other options for treatment, the patient has consented to  Procedure(s): LEFT HEART CATH AND CORONARY ANGIOGRAPHY (N/A) as a surgical intervention.  The patient's history has been reviewed, patient examined, no change in status, stable for surgery.  I have reviewed the patient's chart and labs.  Questions were answered to the patient's satisfaction.     Larae Grooms

## 2021-02-14 NOTE — Discharge Instructions (Signed)
Radial Site Care  This sheet gives you information about how to care for yourself after your procedure. Your health care provider may also give you more specific instructions. If you have problems or questions, contact your health care provider. What can I expect after the procedure? After the procedure, it is common to have: Bruising and tenderness at the catheter insertion area. Follow these instructions at home: Medicines Take over-the-counter and prescription medicines only as told by your health care provider. Insertion site care Follow instructions from your health care provider about how to take care of your insertion site. Make sure you: Wash your hands with soap and water before you remove your bandage (dressing). If soap and water are not available, use hand sanitizer. May remove dressing in 24 hours. Check your insertion site every day for signs of infection. Check for: Redness, swelling, or pain. Fluid or blood. Pus or a bad smell. Warmth. Do no take baths, swim, or use a hot tub for 5 days. You may shower 24-48 hours after the procedure. Remove the dressing and gently wash the site with plain soap and water. Pat the area dry with a clean towel. Do not rub the site. That could cause bleeding. Do not apply powder or lotion to the site. Activity  For 24 hours after the procedure, or as directed by your health care provider: Do not flex or bend the affected arm. Do not push or pull heavy objects with the affected arm. Do not drive yourself home from the hospital or clinic. You may drive 24 hours after the procedure. Do not operate machinery or power tools. KEEP ARM ELEVATED THE REMAINDER OF THE DAY. Do not push, pull or lift anything that is heavier than 10 lb for 5 days. Ask your health care provider when it is okay to: Return to work or school. Resume usual physical activities or sports. Resume sexual activity. General instructions If the catheter site starts to  bleed, raise your arm and put firm pressure on the site. If the bleeding does not stop, get help right away. This is a medical emergency. DRINK PLENTY OF FLUIDS FOR THE NEXT 2-3 DAYS. No alcohol consumption for 24 hours after receiving sedation. If you went home on the same day as your procedure, a responsible adult should be with you for the first 24 hours after you arrive home. Keep all follow-up visits as told by your health care provider. This is important. Contact a health care provider if: You have a fever. You have redness, swelling, or yellow drainage around your insertion site. Get help right away if: You have unusual pain at the radial site. The catheter insertion area swells very fast. The insertion area is bleeding, and the bleeding does not stop when you hold steady pressure on the area. Your arm or hand becomes pale, cool, tingly, or numb. These symptoms may represent a serious problem that is an emergency. Do not wait to see if the symptoms will go away. Get medical help right away. Call your local emergency services (911 in the [place] Do not drive yourself to the hospital. Summary After the procedure, it is common to have bruising and tenderness at the site. Follow instructions from your health care provider about how to take care of your radial site wound. Check the wound every day for signs of infection.  This information is not intended to replace advice given to you by your health care provider. Make sure you discuss any questions you have with   your health care provider. Document Revised: 02/25/2017 Document Reviewed: 02/25/2017 Elsevier Patient Education  2020 Elsevier Inc.  

## 2021-02-14 NOTE — Progress Notes (Signed)
Pt ambulated without difficulty or bleeding.   Discharged home with wife who will drive and stay with pt x 24 hrs 

## 2021-02-15 ENCOUNTER — Encounter (HOSPITAL_COMMUNITY): Payer: Self-pay | Admitting: Interventional Cardiology

## 2021-02-15 MED ORDER — ATORVASTATIN CALCIUM 10 MG PO TABS: 10.0000 mg | ORAL_TABLET | Freq: Every day | ORAL | 3 refills | Status: DC

## 2021-02-15 MED FILL — Verapamil HCl IV Soln 2.5 MG/ML: INTRAVENOUS | Qty: 2 | Status: AC

## 2021-02-15 NOTE — Addendum Note (Signed)
Addended by: Truddie Hidden on: 02/15/2021 11:34 AM   Modules accepted: Orders

## 2021-03-13 DIAGNOSIS — I2510 Atherosclerotic heart disease of native coronary artery without angina pectoris: Secondary | ICD-10-CM | POA: Insufficient documentation

## 2021-03-22 ENCOUNTER — Telehealth: Payer: Self-pay | Admitting: Gastroenterology

## 2021-03-22 NOTE — Telephone Encounter (Signed)
Good Afternoon Dr. Lyndel Safe,   I received a call from patient wanting to do a transfer of care to Dr. Carlean Purl. Patient stated that his wife is a patient of Dr. Carlean Purl and would like to keep the same Dr. As her.   Are you okay with the switch?

## 2021-03-26 ENCOUNTER — Telehealth: Payer: Self-pay | Admitting: Cardiology

## 2021-03-26 DIAGNOSIS — E785 Hyperlipidemia, unspecified: Secondary | ICD-10-CM

## 2021-03-26 MED ORDER — ATORVASTATIN CALCIUM 10 MG PO TABS: 10.0000 mg | ORAL_TABLET | Freq: Every day | ORAL | 3 refills | Status: DC

## 2021-03-26 NOTE — Telephone Encounter (Signed)
°*  STAT* If patient is at the pharmacy, call can be transferred to refill team.   1. Which medications need to be refilled? (please list name of each medication and dose if known) atorvastatin (LIPITOR) 20 MG tablet  2. Which pharmacy/location (including street and city if local pharmacy) is medication to be sent to? [facility] [place], [place]  3. Do they need a 30 day or 90 day supply? 90 day

## 2021-03-26 NOTE — Telephone Encounter (Signed)
Refill sent in per request.  

## 2021-03-27 NOTE — Telephone Encounter (Signed)
Absolutely Pt will be in good hands RG

## 2021-03-28 NOTE — Telephone Encounter (Signed)
OK 

## 2021-03-28 NOTE — Telephone Encounter (Signed)
Dr. Carlean Purl, are you okay with taking on care for patient?

## 2021-04-04 ENCOUNTER — Ambulatory Visit: Payer: Medicare Other | Admitting: Cardiology

## 2021-04-04 ENCOUNTER — Encounter: Payer: Self-pay | Admitting: Cardiology

## 2021-04-04 ENCOUNTER — Other Ambulatory Visit: Payer: Self-pay

## 2021-04-04 VITALS — BP 102/70 | HR 75 | Ht 74.0 in | Wt 266.2 lb

## 2021-04-04 DIAGNOSIS — Z95810 Presence of automatic (implantable) cardiac defibrillator: Secondary | ICD-10-CM

## 2021-04-04 DIAGNOSIS — I428 Other cardiomyopathies: Secondary | ICD-10-CM | POA: Diagnosis not present

## 2021-04-04 DIAGNOSIS — I2510 Atherosclerotic heart disease of native coronary artery without angina pectoris: Secondary | ICD-10-CM | POA: Diagnosis not present

## 2021-04-04 DIAGNOSIS — R7303 Prediabetes: Secondary | ICD-10-CM | POA: Diagnosis not present

## 2021-04-04 MED ORDER — ATORVASTATIN CALCIUM 20 MG PO TABS: 20.0000 mg | ORAL_TABLET | Freq: Every day | ORAL | 3 refills | Status: DC

## 2021-04-04 NOTE — Patient Instructions (Signed)
Medication Instructions:  ?Your physician has recommended you make the following change in your medication: Increasing Atorvastatin to 20 mg daily. ?*If you need a refill on your cardiac medications before your next appointment, please call your pharmacy* ? ? ?Lab Work: ?Your physician recommends that you return for lab work in: Lipid Profile around the week of April 10th fasting. ?If you have labs (blood work) drawn today and your tests are completely normal, you will receive your results only by: ?MyChart Message (if you have MyChart) OR ?A paper copy in the mail ?If you have any lab test that is abnormal or we need to change your treatment, we will call you to review the results. ? ? ?Testing/Procedures: ?None ? ? ?Follow-Up: ?At [facility], you and your health needs are our priority.  As part of our continuing mission to provide you with exceptional heart care, we have created designated Provider Care Teams.  These Care Teams include your primary Cardiologist (physician) and Advanced Practice Providers (APPs -  Physician Assistants and Nurse Practitioners) who all work together to provide you with the care you need, when you need it. ? ?We recommend signing up for the patient portal called "MyChart".  Sign up information is provided on this After Visit Summary.  MyChart is used to connect with patients for Virtual Visits (Telemedicine).  Patients are able to view lab/test results, encounter notes, upcoming appointments, etc.  Non-urgent messages can be sent to your provider as well.   ?To learn more about what you can do with MyChart, go to [email or web address].   ? ?Your next appointment:   ?5 month(s) ? ?The format for your next appointment:   ?In Person ? ?Provider:   ?Jenne Campus, MD  ? ? ?Other Instructions ?  ?

## 2021-04-04 NOTE — Progress Notes (Signed)
Cardiology Office Note:    Date:  04/04/2021   ID:  Raymond Alvarez, DOB [birth year], MRN [id number]  PCP:  Raina Mina., MD  Cardiologist:  Jenne Campus, MD    Referring MD: Raina Mina., MD   Chief Complaint  Patient presents with   Follow-up  Doing fine  History of Present Illness:    Raymond Alvarez is a 66 y.o. 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  However, recent echocardiogram shows segmental motion abnormalities which prompted stress testing which showed gross abnormality.  Stress tissue ischemia involving inferior wall.  I brought him today to my office to talk about potentially doing cardiac catheterization.  He does have BiV ICD which is [employer] device functioning properly, benign essential hypertension, prediabetes, dyslipidemia with direct LDL recently checked 106. He end up having cardiac catheterization which showed only minimal luminal irregularities with up to 10% stenosis.  He comes today 2 months follow-up for doing very well.  He is very happy with the results of his cardiac catheterization.  We had a long discussion about this.  He denies have any chest pain tightness squeezing pressure burning chest no palpitations dizziness swelling of lower extremities  Past Medical History:  Diagnosis Date   Anemia    Anxiety    Anxiety disorder 03/20/2015   Asthma    Benign essential hypertension 03/20/2015   Last Assessment & Plan:  Relevant Hx: Course: Daily Update: Today's Plan:this appears stable overall  Electronically signed by: Baldemar Friday, [place] 03/20/15 1509  Last Assessment & Plan:  Formatting of this note might be different from the original. 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   Palpitations 03/20/2015   Prediabetes 06/17/2018   Prostate cancer ([place])    Pulmonary nodule 05/12/2018   PVC (premature ventricular contraction) 03/20/2015   Rhabdomyolysis 05/12/2018   Seasonal allergies    Suspected COVID-19 virus infection 05/12/2018   Viral  gastroenteritis 05/12/2018   Wears glasses     Past Surgical History:  Procedure Laterality Date   APPENDECTOMY  1970   BIV ICD INSERTION CRT-D N/A 01/13/2019   Procedure: BIV ICD INSERTION CRT-D;  Surgeon: Constance Haw, MD;  Location: [place] [facility];  Service: Cardiovascular;  Laterality: N/A;   COLONOSCOPY  09/24/2016   Colonic polyp status post polypectomy. Tubular adenoma.    ESOPHAGOGASTRODUODENOSCOPY  12/10/2016   Small hiatal hernia. Mild gastritis. Gastric polyps status post polypectomy x 4    LEFT HEART CATH AND CORONARY ANGIOGRAPHY N/A 02/14/2021   Procedure: LEFT HEART CATH AND CORONARY ANGIOGRAPHY;  Surgeon: Jettie Booze, MD;  Location: [place] [facility];  Service: Cardiovascular;  Laterality: N/A;   MASS EXCISION Left 12/15/2013   Procedure: EXCISION MUCOID TUMOR LEFT INDEX DEBRIDEMENT DISTAL INTERPHALANGEAL JOINT LEFT INDEX FINGER;  Surgeon: Daryll Brod, MD;  Location: [place];  Service: Orthopedics;  Laterality: Left;   PROSTATECTOMY  2015   TONSILLECTOMY      Current Medications: Current Meds  Medication Sig   Albuterol Sulfate (PROAIR RESPICLICK) 347 (90 Base) MCG/ACT AEPB Inhale 1-2 puffs into the lungs every 6 (six) hours as needed (wheezing/shortness of breath).   allopurinol (ZYLOPRIM) 300 MG tablet Take 300 mg by mouth in the morning.   aspirin EC 81 MG tablet Take 1 tablet (81 mg total) by mouth daily. Swallow whole.   atorvastatin (LIPITOR) 10 MG tablet Take 1 tablet (10 mg total) by mouth daily.   Black Pepper-Turmeric 3-500 MG CAPS Take 1 capsule by mouth in the morning.   carvedilol (COREG) 12.5 MG tablet Take 12.5 mg by mouth 2 (two) times daily with a meal.   cyanocobalamin (,VITAMIN B-12,) 1000 MCG/ML injection Inject 1,000 mcg into the muscle every 30 (thirty) days.   diclofenac Sodium (VOLTAREN) 1 % GEL Apply 1 application topically 3 (three) times daily as needed (right knee pain/shoulder pain.). Applied to right knee    dicyclomine (BENTYL) 10 MG capsule Take 1 capsule (10 mg total) by mouth 4 (four) times daily -  before meals and at bedtime. (Patient taking differently: Take 10 mg by mouth in the morning and at bedtime.)   fluticasone (FLONASE) 50 MCG/ACT nasal spray Place 1 spray into both nostrils in the morning.   hyoscyamine (LEVSIN SL) 0.125 MG SL tablet Place 1 tablet (0.125 mg total) under the tongue every 4 (four) hours as needed.   LORazepam (ATIVAN) 1 MG tablet Take 1 mg by mouth daily as needed for anxiety.   methocarbamol (ROBAXIN) 500 MG tablet Take 500 mg by mouth 4 (four) times daily as needed for muscle spasms.   neomycin-polymyxin-hydrocortisone (CORTISPORIN) 3.5-10000-1 OTIC suspension Place 3 drops into both ears 3 (three) times daily as needed (pain/itching in ear(s).).   omeprazole (PRILOSEC) 20 MG capsule Take 20 mg by mouth daily.   Polyethyl Glycol-Propyl Glycol (SYSTANE ULTRA OP) Place 1 drop into both eyes in the morning.   Probiotic Product (PROBIOTIC PO) Take 1 capsule by mouth daily. Unknown strength   pyridOXINE (VITAMIN B-6) 100 MG tablet Take 200 mg by mouth in the morning.   sacubitril-valsartan (ENTRESTO) 49-51 MG Take 1 tablet by  mouth 2 (two) times daily.   spironolactone (ALDACTONE) 25 MG tablet Take 25 mg by mouth daily.    vitamin C (VITAMIN C) 500 MG tablet Take 1 tablet (500 mg total) by mouth daily.   vortioxetine HBr (TRINTELLIX) 20 MG TABS tablet Take 20 mg by mouth every evening.      Allergies:   Other and Sulfa antibiotics   Social History   Socioeconomic History   Marital status: Married    Spouse name: Not on file   Number of children: 2   Years of education: Not on file   Highest education level: Not on file  Occupational History   Occupation: Sport and exercise psychologist  Tobacco Use   Smoking status: Never   Smokeless tobacco: Never  Vaping Use   Vaping Use: Never used  Substance and Sexual Activity   Alcohol use: Yes    Comment: occ   Drug use:  No   Sexual activity: Not on file  Other Topics Concern   Not on file  Social History Narrative   Not on file   Social Determinants of Health   Financial Resource Strain: Not on file  Food Insecurity: Not on file  Transportation Needs: Not on file  Physical Activity: Not on file  Stress: Not on file  Social Connections: Not on file     Family History: The patient's family history includes Breast cancer in his mother; Prostate cancer in his father and maternal grandfather. There is no history of Colon cancer. ROS:   Please see the history of present illness.    All 14 point review of systems negative except as described per history of present illness  EKGs/Labs/Other Studies Reviewed:      Recent Labs: 02/11/2021: BUN 20; Creatinine, Ser 1.15; Hemoglobin 14.1; Platelets 238; Potassium 4.8; Sodium 137  Recent Lipid Panel    Component Value Date/Time   CHOL 169 02/11/2021 1114   TRIG 142 02/11/2021 1114   HDL 32 (L) 02/11/2021 1114   CHOLHDL 5.3 (H) 02/11/2021 1114   LDLCALC 111 (H) 02/11/2021 1114   LDLDIRECT 108 (H) 02/11/2021 1114    Physical Exam:    VS:  BP 102/70 (BP Location: Left Arm, Patient Position: Sitting)    Pulse 75    Ht 6\' 2"  (1.88 m)    Wt 266 lb 3.2 oz (120.7 kg)    SpO2 95%    BMI 34.18 kg/m     Wt Readings from Last 3 Encounters:  04/04/21 266 lb 3.2 oz (120.7 kg)  02/14/21 270 lb (122.5 kg)  02/11/21 273 lb (123.8 kg)     GEN:  Well nourished, well developed in no acute distress HEENT: Normal NECK: No JVD; No carotid bruits LYMPHATICS: No lymphadenopathy CARDIAC: RRR, no murmurs, no rubs, no gallops RESPIRATORY:  Clear to auscultation without rales, wheezing or rhonchi  ABDOMEN: Soft, non-tender, non-distended MUSCULOSKELETAL:  No edema; No deformity  SKIN: Warm and dry LOWER EXTREMITIES: no swelling NEUROLOGIC:  Alert and oriented x 3 PSYCHIATRIC:  Normal affect   ASSESSMENT:    1. Coronary artery disease involving native coronary  artery of native heart without angina pectoris   2. Nonischemic cardiomyopathy ([place])   3. 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 Prediabetes    PLAN:    In order of problems listed above:  Cardiomyopathy on guideline directed medical therapy.  We will continue present management.  He is hemodynamically stable and compensated ICD present no discharges no detection continue present management.  Dyslipidemia we did increase Lipitor to 20 mg daily.  We will give him prescription we will recheck fasting lipid profile in 6 weeks. There was some question about potentially going on SGLT agent however his ejection fraction is 5055%.  I do not think we have indication for it now.   Medication Adjustments/Labs and Tests Ordered: Current medicines are reviewed at length with the patient today.  Concerns regarding medicines are outlined above.  No orders of the defined types were placed in this encounter.  Medication changes: No orders of the defined types were placed in this encounter.   Signed, Park Liter, MD, [facility] 04/04/2021 11:32 AM    [place]

## 2021-04-04 NOTE — Addendum Note (Signed)
Addended by: Darrel Reach on: 04/04/2021 11:43 AM ? ? Modules accepted: Orders ? ?

## 2021-04-12 ENCOUNTER — Ambulatory Visit (INDEPENDENT_AMBULATORY_CARE_PROVIDER_SITE_OTHER): Payer: Medicare Other

## 2021-04-12 DIAGNOSIS — I428 Other cardiomyopathies: Secondary | ICD-10-CM | POA: Diagnosis not present

## 2021-04-13 LAB — CUP PACEART REMOTE DEVICE CHECK
Battery Remaining Longevity: 59 mo
Battery Remaining Percentage: 68 %
Battery Voltage: 2.96 V
Brady Statistic AP VP Percent: 10 %
Brady Statistic AP VS Percent: 1 %
Brady Statistic AS VP Percent: 90 %
Brady Statistic AS VS Percent: 1 %
Brady Statistic RA Percent Paced: 10 %
Date Time Interrogation Session: 20230310131117
HighPow Impedance: 72 Ohm
Implantable Lead Implant Date: 20201210
Implantable Lead Implant Date: 20201210
Implantable Lead Implant Date: 20201210
Implantable Lead Location: 753858
Implantable Lead Location: 753859
Implantable Lead Location: 753860
Implantable Pulse Generator Implant Date: 20201210
Lead Channel Impedance Value: 440 Ohm
Lead Channel Impedance Value: 450 Ohm
Lead Channel Impedance Value: 490 Ohm
Lead Channel Pacing Threshold Amplitude: 0.5 V
Lead Channel Pacing Threshold Amplitude: 0.5 V
Lead Channel Pacing Threshold Amplitude: 1 V
Lead Channel Pacing Threshold Pulse Width: 0.5 ms
Lead Channel Pacing Threshold Pulse Width: 0.5 ms
Lead Channel Pacing Threshold Pulse Width: 0.5 ms
Lead Channel Sensing Intrinsic Amplitude: 12 mV
Lead Channel Sensing Intrinsic Amplitude: 4.2 mV
Lead Channel Setting Pacing Amplitude: 2 V
Lead Channel Setting Pacing Amplitude: 2.5 V
Lead Channel Setting Pacing Amplitude: 2.5 V
Lead Channel Setting Pacing Pulse Width: 0.5 ms
Lead Channel Setting Pacing Pulse Width: 0.5 ms
Lead Channel Setting Sensing Sensitivity: 0.5 mV
Pulse Gen Serial Number: 111014317

## 2021-04-17 NOTE — Progress Notes (Signed)
Remote ICD transmission.   

## 2021-04-29 ENCOUNTER — Encounter: Payer: BC Managed Care – PPO | Admitting: Cardiology

## 2021-05-09 NOTE — Progress Notes (Signed)
? ? ?Electrophysiology Office Note ?Date: 05/14/2021 ? ?ID:  Raymond Alvarez, DOB 1955/11/18, MRN [id number] ? ?PCP: Raina Mina., MD ?Primary Cardiologist: None ?Electrophysiologist: Will Meredith Leeds, MD  ? ?CC: Routine ICD follow-up ? ?Raymond Alvarez is a 66 y.o. male seen today for Will Meredith Leeds, MD for routine electrophysiology followup.  Since last being seen in our clinic the patient reports doing very well.  he denies chest pain, palpitations, dyspnea, PND, orthopnea, nausea, vomiting, dizziness, syncope, edema, weight gain, or early satiety. He has not had ICD shocks.  ? ?Device History: ?[employer]. Jude BiV ICD implanted 01/2019 for CHF ? ?Past Medical History:  ?Diagnosis Date  ? Anemia   ? Anxiety   ? Anxiety disorder 03/20/2015  ? Asthma   ? Benign essential hypertension 03/20/2015  ? Last Assessment & Plan:  Relevant Hx: Course: Daily Update: Today's Plan:this appears stable overall  Electronically signed by: Baldemar Friday, [place] 03/20/15 1509  Last Assessment & Plan:  Formatting of this note might be different from the original. Relevant Hx: Course: Daily Update: Today's Plan:this appears stable overall  Electronically signed by: Baldemar Friday, FNP 03/20/15   ? Chronic conjunctivitis of left eye 08/25/2019  ? Chronic kidney disease   ? Chronic systolic heart failure ([place]) 09/12/2019  ? Class 2 severe obesity due to excess calories with serious comorbidity and body mass index (BMI) of 35.0 to 35.9 in adult [facility]) 03/20/2015  ? Colon polyp   ? 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 Depression   ? Dilated cardiomyopathy ([place]) 07/15/2018  ? Ejection fraction 30 to 35% based on echo in June 2020  Formatting of this note might be different from the original. Ejection fraction 30 to 35% based on echo in June 2020  ? Diverticulitis   ? Dyspnea on exertion 06/25/2018  ? Erectile dysfunction 11/10/2019  ? Gastroesophageal reflux disease without esophagitis 07/31/2016  ?  GERD (gastroesophageal reflux disease)   ? Gout   ? History of 2019 novel coronavirus disease (COVID-19) 05/12/2018  ? History of prostate cancer 11/10/2019  ? Hypertension   ? Hyponatremia 05/11/2018  ? 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 Last Assessment & Plan:  Relevant Hx: Course: Daily Update: Today's Plan:this appears stable overall, will get kenalog to help hasten symtpoms  Electronically signed by: Baldemar Friday, [place] 03/20/15 1510  Last Assessment & Plan:  Formatting of this note might be different from the original. Relevant Hx: Course: Daily Update: Today's Plan:this appears stable overall, will get kenalog to he  ? Iron deficiency anemia due to chronic blood loss 01/07/2017  ? Mild episode of recurrent major depressive disorder ([place]) 06/17/2018  ? NASH (nonalcoholic steatohepatitis) 06/17/2018  ? New onset left bundle branch block (LBBB) 05/27/2018  ? Obstructive sleep apnea 04/25/2019  ? 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 Palpitations 03/20/2015  ? Prediabetes 06/17/2018  ? Prostate cancer ([place])   ? Pulmonary nodule 05/12/2018  ? PVC (premature ventricular contraction) 03/20/2015  ? Rhabdomyolysis 05/12/2018  ? Seasonal allergies   ? Suspected COVID-19 virus infection 05/12/2018  ? Viral gastroenteritis 05/12/2018  ? Wears glasses   ? ?Past Surgical History:  ?Procedure Laterality Date  ? APPENDECTOMY  1970  ? BIV ICD INSERTION CRT-D N/A 01/13/2019  ? Procedure: BIV ICD INSERTION CRT-D;  Surgeon: Constance Haw, MD;  Location: [place] [facility];  Service: Cardiovascular;  Laterality: N/A;  ? COLONOSCOPY  09/24/2016  ? Colonic polyp status post polypectomy. Tubular  adenoma.   ? ESOPHAGOGASTRODUODENOSCOPY  12/10/2016  ? Small hiatal hernia. Mild gastritis. Gastric polyps status post polypectomy x 4   ? LEFT HEART CATH AND CORONARY ANGIOGRAPHY N/A 02/14/2021  ? Procedure: LEFT HEART CATH AND CORONARY ANGIOGRAPHY;  Surgeon: Jettie Booze, MD;  Location: [place] [facility];  Service:  Cardiovascular;  Laterality: N/A;  ? MASS EXCISION Left 12/15/2013  ? Procedure: EXCISION MUCOID TUMOR LEFT INDEX DEBRIDEMENT DISTAL INTERPHALANGEAL JOINT LEFT INDEX FINGER;  Surgeon: Daryll Brod, MD;  Location: [place];  Service: Orthopedics;  Laterality: Left;  ? PROSTATECTOMY  2015  ? TONSILLECTOMY    ? ? ?Current Outpatient Medications  ?Medication Sig Dispense Refill  ? Albuterol Sulfate (PROAIR RESPICLICK) 295 (90 Base) MCG/ACT AEPB Inhale 1-2 puffs into the lungs every 6 (six) hours as needed (wheezing/shortness of breath).    ? allopurinol (ZYLOPRIM) 300 MG tablet Take 300 mg by mouth in the morning.    ? aspirin EC 81 MG tablet Take 1 tablet (81 mg total) by mouth daily. Swallow whole. 90 tablet 3  ? atorvastatin (LIPITOR) 20 MG tablet Take 1 tablet (20 mg total) by mouth daily. 90 tablet 3  ? Black Pepper-Turmeric 3-500 MG CAPS Take 1 capsule by mouth in the morning.    ? carvedilol (COREG) 12.5 MG tablet Take 12.5 mg by mouth 2 (two) times daily with a meal.    ? cyanocobalamin (,VITAMIN B-12,) 1000 MCG/ML injection Inject 1,000 mcg into the muscle every 30 (thirty) days.    ? diclofenac Sodium (VOLTAREN) 1 % GEL Apply 1 application topically 3 (three) times daily as needed (right knee pain/shoulder pain.). Applied to right knee    ? dicyclomine (BENTYL) 10 MG capsule Take 1 capsule (10 mg total) by mouth 4 (four) times daily -  before meals and at bedtime. (Patient taking differently: Take 10 mg by mouth in the morning and at bedtime.) 120 capsule 11  ? fluticasone (FLONASE) 50 MCG/ACT nasal spray Place 1 spray into both nostrils in the morning.    ? hyoscyamine (LEVSIN SL) 0.125 MG SL tablet Place 1 tablet (0.125 mg total) under the tongue every 4 (four) hours as needed. 120 tablet 8  ? LORazepam (ATIVAN) 1 MG tablet Take 1 mg by mouth daily as needed for anxiety.    ? methocarbamol (ROBAXIN) 500 MG tablet Take 500 mg by mouth 4 (four) times daily as needed for muscle spasms.    ?  neomycin-polymyxin-hydrocortisone (CORTISPORIN) 3.5-10000-1 OTIC suspension Place 3 drops into both ears 3 (three) times daily as needed (pain/itching in ear(s).).    ? omeprazole (PRILOSEC) 20 MG capsule Take 20 mg by mouth daily.    ? Polyethyl Glycol-Propyl Glycol (SYSTANE ULTRA OP) Place 1 drop into both eyes in the morning.    ? Probiotic Product (PROBIOTIC PO) Take 1 capsule by mouth daily. Unknown strength    ? pyridOXINE (VITAMIN B-6) 100 MG tablet Take 200 mg by mouth in the morning.    ? sacubitril-valsartan (ENTRESTO) 49-51 MG Take 1 tablet by mouth 2 (two) times daily.    ? spironolactone (ALDACTONE) 25 MG tablet Take 25 mg by mouth daily.     ? vitamin C (VITAMIN C) 500 MG tablet Take 1 tablet (500 mg total) by mouth daily. 30 tablet 0  ? vortioxetine HBr (TRINTELLIX) 20 MG TABS tablet Take 20 mg by mouth every evening.     ? ?No current facility-administered medications for this visit.  ? ? ?Allergies:  Other and Sulfa antibiotics  ? ?Social History: ?Social History  ? ?Socioeconomic History  ? Marital status: Married  ?  Spouse name: Not on file  ? Number of children: 2  ? Years of education: Not on file  ? Highest education level: Not on file  ?Occupational History  ? Occupation: Sport and exercise psychologist  ?Tobacco Use  ? Smoking status: Never  ? Smokeless tobacco: Never  ?Vaping Use  ? Vaping Use: Never used  ?Substance and Sexual Activity  ? Alcohol use: Yes  ?  Comment: occ  ? Drug use: No  ? Sexual activity: Not on file  ?Other Topics Concern  ? Not on file  ?Social History Narrative  ? Not on file  ? ?Social Determinants of Health  ? ?Financial Resource Strain: Not on file  ?Food Insecurity: Not on file  ?Transportation Needs: Not on file  ?Physical Activity: Not on file  ?Stress: Not on file  ?Social Connections: Not on file  ?Intimate Partner Violence: Not on file  ? ? ?Family History: ?Family History  ?Problem Relation Age of Onset  ? Breast cancer Mother   ? Prostate cancer Father   ? Prostate  cancer Maternal Grandfather   ? Colon cancer Neg Hx   ? ? ?Review of Systems: ?All other systems reviewed and are otherwise negative except as noted above. ? ? ?Physical Exam: ?Vitals:  ? 05/14/21 1039  ?BP:

## 2021-05-14 ENCOUNTER — Encounter: Payer: Self-pay | Admitting: Student

## 2021-05-14 ENCOUNTER — Ambulatory Visit: Payer: Medicare Other | Admitting: Student

## 2021-05-14 VITALS — BP 96/68 | HR 66 | Ht 74.0 in | Wt 264.2 lb

## 2021-05-14 DIAGNOSIS — I428 Other cardiomyopathies: Secondary | ICD-10-CM | POA: Diagnosis not present

## 2021-05-14 DIAGNOSIS — I10 Essential (primary) hypertension: Secondary | ICD-10-CM

## 2021-05-14 LAB — CUP PACEART INCLINIC DEVICE CHECK
Battery Remaining Longevity: 58 mo
Brady Statistic RA Percent Paced: 11 %
Brady Statistic RV Percent Paced: 99.96 %
Date Time Interrogation Session: 20230411110715
HighPow Impedance: 76.5 Ohm
Implantable Lead Implant Date: 20201210
Implantable Lead Implant Date: 20201210
Implantable Lead Implant Date: 20201210
Implantable Lead Location: 753858
Implantable Lead Location: 753859
Implantable Lead Location: 753860
Implantable Pulse Generator Implant Date: 20201210
Lead Channel Impedance Value: 450 Ohm
Lead Channel Impedance Value: 475 Ohm
Lead Channel Impedance Value: 500 Ohm
Lead Channel Pacing Threshold Amplitude: 0.5 V
Lead Channel Pacing Threshold Amplitude: 0.5 V
Lead Channel Pacing Threshold Amplitude: 0.75 V
Lead Channel Pacing Threshold Amplitude: 0.75 V
Lead Channel Pacing Threshold Amplitude: 1 V
Lead Channel Pacing Threshold Amplitude: 1 V
Lead Channel Pacing Threshold Pulse Width: 0.5 ms
Lead Channel Pacing Threshold Pulse Width: 0.5 ms
Lead Channel Pacing Threshold Pulse Width: 0.5 ms
Lead Channel Pacing Threshold Pulse Width: 0.5 ms
Lead Channel Pacing Threshold Pulse Width: 0.5 ms
Lead Channel Pacing Threshold Pulse Width: 0.5 ms
Lead Channel Sensing Intrinsic Amplitude: 12 mV
Lead Channel Sensing Intrinsic Amplitude: 4.9 mV
Lead Channel Setting Pacing Amplitude: 2 V
Lead Channel Setting Pacing Amplitude: 2.5 V
Lead Channel Setting Pacing Amplitude: 2.5 V
Lead Channel Setting Pacing Pulse Width: 0.5 ms
Lead Channel Setting Pacing Pulse Width: 0.5 ms
Lead Channel Setting Sensing Sensitivity: 0.5 mV
Pulse Gen Serial Number: 111014317

## 2021-05-14 NOTE — Patient Instructions (Signed)
Medication Instructions:  ?Your physician recommends that you continue on your current medications as directed. Please refer to the Current Medication list given to you today. ? ?*If you need a refill on your cardiac medications before your next appointment, please call your pharmacy* ? ? ?Lab Work: ?None ?If you have labs (blood work) drawn today and your tests are completely normal, you will receive your results only by: ?MyChart Message (if you have MyChart) OR ?A paper copy in the mail ?If you have any lab test that is abnormal or we need to change your treatment, we will call you to review the results. ? ? ?Follow-Up: ?At [facility], you and your health needs are our priority.  As part of our continuing mission to provide you with exceptional heart care, we have created designated Provider Care Teams.  These Care Teams include your primary Cardiologist (physician) and Advanced Practice Providers (APPs -  Physician Assistants and Nurse Practitioners) who all work together to provide you with the care you need, when you need it. ? ? ?Your next appointment:   ?1 year(s) ? ?The format for your next appointment:   ?In Person ? ?Provider:   ?Allegra Lai, MD  ? ?Important Information About Sugar ? ? ? ? ?  ?

## 2021-07-03 ENCOUNTER — Ambulatory Visit (INDEPENDENT_AMBULATORY_CARE_PROVIDER_SITE_OTHER): Payer: Medicare Other | Admitting: Mental Health

## 2021-07-03 DIAGNOSIS — F411 Generalized anxiety disorder: Secondary | ICD-10-CM | POA: Diagnosis not present

## 2021-07-03 NOTE — Progress Notes (Signed)
[facility] Counselor Initial Adult Exam  Name: Raymond Alvarez Date: 07/03/2021 MRN: [id number] DOB: [birth year] PCP: Raina Mina., MD  Time spent: 50 minutes  Reason for Visit Raymond Alvarez Problem:  Patient reports being in therapy for many years with his last therapist until retired last Fall. He was most recently seeing Raymond Alvarez- therapist over the last few months. He stated he realizes he has been coping w/ some trauma related various life events.  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 Filed for [employer]; worked for the school system. He stated he continues to cope w/ anger, feelings of guilt due to how he was treated by his PCP;  He was dismissed by his PCP in February 2023; currently sees a PA in the practice about every 6 months for checkups.  He has debated going back to talk w/ his the clinic doctor to let him know how he feels as he stated he was not supportive or validating of his complications from having Covid 19. His mother passed in 2005 abruptly, his wife's father was dx'd w/ brain cancer that same year and moved in with them at that time. Mother in law coped w/ PTSD and alcoholism. He cared for her primarly as his wife worked many hours as a Software engineer. His father passed in 7001 from complications related to the brain cancer. Mother in law passed in 74.  Patient wants to continue to work through past traumatic life events and improve coping.  Mental Status Exam:    Appearance:    Casual     Behavior:   Appropriate  Motor:   WNL  Speech/Language:    Clear and Coherent  Affect:   Full range   Mood:   Euthymic  Thought process:   Logical, linear, goal directed  Thought content:     WNL  Sensory/Perceptual disturbances:     none  Orientation:   x4  Attention:    Good  Concentration:   Good  Memory:   Intact  Fund of knowledge:    Consistent with age and development  Insight:     Good  Judgment:    Good  Impulse Control:   Good     Reported Symptoms: Anxiety, rumination, depressed mood Risk Assessment: Danger to Self:  No Self-injurious Behavior: No Danger to Others: No Duty to Warn:no Physical Aggression / Violence:No  Access to Firearms a concern: No  Gang Involvement:No  Patient / guardian was educated about steps to take if suicide or homicide risk level increases between visits: yes While future psychiatric events cannot be accurately predicted, the patient does not currently require acute inpatient psychiatric care and does not currently meet [facility] involuntary commitment criteria.  Substance Abuse History: Current substance abuse: No     Family History:  Family History  Problem Relation Age of Onset   Breast cancer Mother    Prostate cancer Father    Prostate cancer Maternal Grandfather    Colon cancer Neg Hx     Medical History/Surgical History: Past Medical History:  Diagnosis Date   Anemia    Anxiety    Anxiety disorder 03/20/2015   Asthma    Benign essential hypertension 03/20/2015   Last Assessment & Plan:  Relevant Hx: Course: Daily Update: Today's Plan:this appears stable  overall  Electronically signed by: Baldemar Friday, [place] 03/20/15 1509  Last Assessment & Plan:  Formatting of this note might be different from the original. 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   Palpitations 03/20/2015   Prediabetes 06/17/2018   Prostate cancer ([place])    Pulmonary nodule 05/12/2018   PVC (premature ventricular contraction) 03/20/2015   Rhabdomyolysis 05/12/2018   Seasonal allergies    Suspected COVID-19 virus infection 05/12/2018   Viral gastroenteritis 05/12/2018   Wears glasses     Past Surgical History:  Procedure Laterality Date   APPENDECTOMY  1970   BIV ICD INSERTION CRT-D N/A 01/13/2019   Procedure: BIV ICD INSERTION CRT-D;  Surgeon: Constance Haw, MD;  Location: [place] [facility];  Service: Cardiovascular;  Laterality: N/A;    COLONOSCOPY  09/24/2016   Colonic polyp status post polypectomy. Tubular adenoma.    ESOPHAGOGASTRODUODENOSCOPY  12/10/2016   Small hiatal hernia. Mild gastritis. Gastric polyps status post polypectomy x 4    LEFT HEART CATH AND CORONARY ANGIOGRAPHY N/A 02/14/2021   Procedure: LEFT HEART CATH AND CORONARY ANGIOGRAPHY;  Surgeon: Jettie Booze, MD;  Location: [place] [facility];  Service: Cardiovascular;  Laterality: N/A;   MASS EXCISION Left 12/15/2013   Procedure: EXCISION MUCOID TUMOR LEFT INDEX DEBRIDEMENT DISTAL INTERPHALANGEAL JOINT LEFT INDEX FINGER;  Surgeon: Daryll Brod, MD;  Location: [place];  Service: Orthopedics;  Laterality: Left;   PROSTATECTOMY  2015   TONSILLECTOMY      Medications: Current Outpatient Medications  Medication Sig Dispense Refill   Albuterol Sulfate (PROAIR RESPICLICK) 494 (90 Base) MCG/ACT AEPB Inhale 1-2 puffs into the lungs every 6 (six) hours as needed (wheezing/shortness of breath).     allopurinol (  ZYLOPRIM) 300 MG tablet Take 300 mg by mouth in the morning.     aspirin EC 81 MG tablet Take 1 tablet (81 mg total) by mouth daily. Swallow whole. 90 tablet 3   atorvastatin (LIPITOR) 20 MG tablet Take 1 tablet (20 mg total) by mouth daily. 90 tablet 3   Black Pepper-Turmeric 3-500 MG CAPS Take 1 capsule by mouth in the morning.     carvedilol (COREG) 12.5 MG tablet Take 12.5 mg by mouth 2 (two) times daily with a meal.     cyanocobalamin (,VITAMIN B-12,) 1000 MCG/ML injection Inject 1,000 mcg into the muscle every 30 (thirty) days.     diclofenac Sodium (VOLTAREN) 1 % GEL Apply 1 application topically 3 (three) times daily as needed (right knee pain/shoulder pain.). Applied to right knee     dicyclomine (BENTYL) 10 MG capsule Take 1 capsule (10 mg total) by mouth 4 (four) times daily -  before meals and at bedtime. (Patient taking differently: Take 10 mg by mouth in the morning and at bedtime.) 120 capsule 11   fluticasone (FLONASE) 50  MCG/ACT nasal spray Place 1 spray into both nostrils in the morning.     hyoscyamine (LEVSIN SL) 0.125 MG SL tablet Place 1 tablet (0.125 mg total) under the tongue every 4 (four) hours as needed. 120 tablet 8   LORazepam (ATIVAN) 1 MG tablet Take 1 mg by mouth daily as needed for anxiety.     methocarbamol (ROBAXIN) 500 MG tablet Take 500 mg by mouth 4 (four) times daily as needed for muscle spasms.     neomycin-polymyxin-hydrocortisone (CORTISPORIN) 3.5-10000-1 OTIC suspension Place 3 drops into both ears 3 (three) times daily as needed (pain/itching in ear(s).).     omeprazole (PRILOSEC) 20 MG capsule Take 20 mg by mouth daily.     Polyethyl Glycol-Propyl Glycol (SYSTANE ULTRA OP) Place 1 drop into both eyes in the morning.     Probiotic Product (PROBIOTIC PO) Take 1 capsule by mouth daily. Unknown strength     pyridOXINE (VITAMIN B-6) 100 MG tablet Take 200 mg by mouth in the morning.     sacubitril-valsartan (ENTRESTO) 49-51 MG Take 1 tablet by mouth 2 (two) times daily.     spironolactone (ALDACTONE) 25 MG tablet Take 25 mg by mouth daily.      vitamin C (VITAMIN C) 500 MG tablet Take 1 tablet (500 mg total) by mouth daily. 30 tablet 0   vortioxetine HBr (TRINTELLIX) 20 MG TABS tablet Take 20 mg by mouth every evening.      No current facility-administered medications for this visit.    Allergies  Allergen Reactions   Other Anaphylaxis    TREE NUTS   Sulfa Antibiotics Itching and Rash    Diagnoses:    ICD-10-CM   1. Anxiety state  F41.1       Plan of Care: TBD   [place], [facility]

## 2021-07-04 ENCOUNTER — Other Ambulatory Visit: Payer: Self-pay

## 2021-07-04 DIAGNOSIS — I10 Essential (primary) hypertension: Secondary | ICD-10-CM

## 2021-07-04 DIAGNOSIS — I5022 Chronic systolic (congestive) heart failure: Secondary | ICD-10-CM

## 2021-07-05 LAB — BASIC METABOLIC PANEL
BUN/Creatinine Ratio: 14 (ref 10–24)
BUN: 16 mg/dL (ref 8–27)
CO2: 22 mmol/L (ref 20–29)
Calcium: 9.6 mg/dL (ref 8.6–10.2)
Chloride: 103 mmol/L (ref 96–106)
Creatinine, Ser: 1.13 mg/dL (ref 0.76–1.27)
Glucose: 113 mg/dL — ABNORMAL HIGH (ref 70–99)
Potassium: 4.2 mmol/L (ref 3.5–5.2)
Sodium: 138 mmol/L (ref 134–144)
eGFR: 73 mL/min/{1.73_m2} (ref >59)

## 2021-07-08 ENCOUNTER — Encounter: Payer: Self-pay | Admitting: Gastroenterology

## 2021-07-09 ENCOUNTER — Telehealth: Payer: Self-pay

## 2021-07-09 NOTE — Telephone Encounter (Signed)
Results reviewed with pt as per Dr. Krasowski's note.  Pt verbalized understanding and had no additional questions. Routed to PCP  

## 2021-07-12 ENCOUNTER — Ambulatory Visit (INDEPENDENT_AMBULATORY_CARE_PROVIDER_SITE_OTHER): Payer: PPO

## 2021-07-12 DIAGNOSIS — I428 Other cardiomyopathies: Secondary | ICD-10-CM | POA: Diagnosis not present

## 2021-07-12 LAB — CUP PACEART REMOTE DEVICE CHECK
Battery Remaining Longevity: 56 mo
Battery Remaining Percentage: 65 %
Battery Voltage: 2.95 V
Brady Statistic AP VP Percent: 15 %
Brady Statistic AP VS Percent: 1 %
Brady Statistic AS VP Percent: 85 %
Brady Statistic AS VS Percent: 1 %
Brady Statistic RA Percent Paced: 14 %
Date Time Interrogation Session: 20230609032032
HighPow Impedance: 73 Ohm
Implantable Lead Implant Date: 20201210
Implantable Lead Implant Date: 20201210
Implantable Lead Implant Date: 20201210
Implantable Lead Location: 753858
Implantable Lead Location: 753859
Implantable Lead Location: 753860
Implantable Pulse Generator Implant Date: 20201210
Lead Channel Impedance Value: 430 Ohm
Lead Channel Impedance Value: 440 Ohm
Lead Channel Impedance Value: 440 Ohm
Lead Channel Pacing Threshold Amplitude: 0.5 V
Lead Channel Pacing Threshold Amplitude: 0.75 V
Lead Channel Pacing Threshold Amplitude: 1 V
Lead Channel Pacing Threshold Pulse Width: 0.5 ms
Lead Channel Pacing Threshold Pulse Width: 0.5 ms
Lead Channel Pacing Threshold Pulse Width: 0.5 ms
Lead Channel Sensing Intrinsic Amplitude: 12 mV
Lead Channel Sensing Intrinsic Amplitude: 4.9 mV
Lead Channel Setting Pacing Amplitude: 2 V
Lead Channel Setting Pacing Amplitude: 2.5 V
Lead Channel Setting Pacing Amplitude: 2.5 V
Lead Channel Setting Pacing Pulse Width: 0.5 ms
Lead Channel Setting Pacing Pulse Width: 0.5 ms
Lead Channel Setting Sensing Sensitivity: 0.5 mV
Pulse Gen Serial Number: 111014317

## 2021-07-17 ENCOUNTER — Ambulatory Visit: Payer: BC Managed Care – PPO | Admitting: Cardiology

## 2021-07-18 NOTE — Progress Notes (Signed)
Remote ICD transmission.   

## 2021-07-19 ENCOUNTER — Ambulatory Visit (INDEPENDENT_AMBULATORY_CARE_PROVIDER_SITE_OTHER): Payer: BC Managed Care – PPO | Admitting: Mental Health

## 2021-07-19 DIAGNOSIS — F411 Generalized anxiety disorder: Secondary | ICD-10-CM | POA: Diagnosis not present
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
## 2021-08-07 ENCOUNTER — Ambulatory Visit: Payer: BC Managed Care – PPO | Admitting: Mental Health

## 2021-08-07 ENCOUNTER — Other Ambulatory Visit: Payer: Self-pay | Admitting: Cardiology

## 2021-08-13 ENCOUNTER — Ambulatory Visit (AMBULATORY_SURGERY_CENTER): Payer: Self-pay | Admitting: *Deleted

## 2021-08-13 VITALS — Ht 74.0 in | Wt 270.0 lb

## 2021-08-13 DIAGNOSIS — Z86010 Personal history of colonic polyps: Secondary | ICD-10-CM

## 2021-08-13 NOTE — Progress Notes (Signed)
No egg or soy allergy known to patient  No issues known to pt with past sedation with any surgeries or procedures Patient denies ever being told they had issues or difficulty with intubation  No FH of Malignant Hyperthermia Pt is not on diet pills Pt is not on  home 02  Pt is not on blood thinners  Pt denies issues with constipation  No A fib or A flutter Have any cardiac testing pending--pt denies  Pt instructed to use Singlecare.com or GoodRx for a price reduction on prep

## 2021-08-19 ENCOUNTER — Ambulatory Visit: Payer: BC Managed Care – PPO | Admitting: Mental Health

## 2021-08-26 ENCOUNTER — Encounter: Payer: Self-pay | Admitting: Gastroenterology

## 2021-09-04 ENCOUNTER — Encounter: Payer: Self-pay | Admitting: Cardiology

## 2021-09-04 ENCOUNTER — Ambulatory Visit (INDEPENDENT_AMBULATORY_CARE_PROVIDER_SITE_OTHER): Payer: PPO | Admitting: Cardiology

## 2021-09-04 VITALS — BP 120/80 | HR 78 | Ht 74.0 in | Wt 273.4 lb

## 2021-09-04 DIAGNOSIS — I10 Essential (primary) hypertension: Secondary | ICD-10-CM | POA: Diagnosis not present

## 2021-09-04 DIAGNOSIS — R0609 Other forms of dyspnea: Secondary | ICD-10-CM | POA: Diagnosis not present

## 2021-09-04 DIAGNOSIS — I428 Other cardiomyopathies: Secondary | ICD-10-CM

## 2021-09-04 DIAGNOSIS — Z6835 Body mass index (BMI) 35.0-35.9, adult: Secondary | ICD-10-CM

## 2021-09-04 DIAGNOSIS — Z95810 Presence of automatic (implantable) cardiac defibrillator: Secondary | ICD-10-CM

## 2021-09-04 NOTE — Addendum Note (Signed)
Addended by: Jacobo Forest D on: 09/04/2021 02:45 PM   Modules accepted: Orders

## 2021-09-04 NOTE — Patient Instructions (Signed)

## 2021-09-04 NOTE — Progress Notes (Signed)
Cardiology Office Note:    Date:  09/04/2021   ID:  Raymond Alvarez, DOB 25-Aug-1955, MRN [id number]  PCP:  Jackquline Denmark, MD  Cardiologist:  Jenne Campus, MD    Referring MD: Raina Mina., MD   No chief complaint on file. Doing fine  History of Present Illness:    Raymond Alvarez is a 66 y.o. male with past medical history significant for nonischemic cardiomyopathy, latest ejection fraction 50 to 55%, he did have a cardiac catheterization done at the beginning of here which did not show significant stenosis only up to 10%, he does have ICD in place, also dyslipidemia. He is coming today to my office for follow-up.  Overall doing very well.  He denies of any chest pain tightness squeezing pressure burning chest.  He just had his first grandson born and he is very trill about it.  Denies have any palpitations dizziness passing out no discharges from the defibrillator  Past Medical History:  Diagnosis Date   Allergy    Anemia    Anxiety    Anxiety disorder 03/20/2015   Asthma    Benign essential hypertension 03/20/2015   Last Assessment & Plan:  Relevant Hx: Course: Daily Update: Today's Plan:this appears stable overall  Electronically signed by: Baldemar Friday, [place] 03/20/15 1509  Last Assessment & Plan:  Formatting of this note might be different from the original. 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 Ejection fraction 30 to 35% based on echo in June 2020   Diverticulitis    Dyspnea on exertion 06/25/2018   Erectile dysfunction 11/10/2019   Gastroesophageal reflux disease without esophagitis 07/31/2016   GERD (gastroesophageal reflux disease)    Gout    History of 2019 novel coronavirus disease (COVID-19) 05/12/2018   History of prostate cancer 11/10/2019   Hypertension    Hyponatremia 05/11/2018   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 48/54/6270   Last Assessment & Plan:  Relevant Hx: Course: Daily Update: Today's Plan:this appears stable overall, will get kenalog to help hasten symtpoms  Electronically signed by: Baldemar Friday, [place] 03/20/15 1510  Last Assessment & Plan:  Formatting of this note might be different from the original. Relevant Hx: Course: Daily Update: Today's Plan:this appears stable overall, will get kenalog to he   Iron deficiency anemia due to chronic blood loss 01/07/2017   Mild episode of recurrent major depressive disorder ([place]) 06/17/2018   NASH (nonalcoholic steatohepatitis) 06/17/2018   New onset left bundle branch block (LBBB) 05/27/2018   Obstructive sleep apnea 04/25/2019   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   Palpitations 03/20/2015   Prediabetes 06/17/2018   Prostate cancer ([place])    Pulmonary nodule 05/12/2018   PVC (premature ventricular contraction) 03/20/2015   Rhabdomyolysis 05/12/2018   Seasonal allergies    Sleep apnea    Suspected COVID-19  virus infection 05/12/2018   Viral gastroenteritis 05/12/2018   Wears glasses     Past Surgical History:  Procedure Laterality Date   APPENDECTOMY  1970   BIV ICD INSERTION CRT-D N/A 01/13/2019   Procedure: BIV ICD INSERTION CRT-D;  Surgeon: Constance Haw, MD;  Location: [place] [facility];  Service: Cardiovascular;  Laterality: N/A;   CARPAL TUNNEL RELEASE     COLONOSCOPY  09/24/2016    Colonic polyp status post polypectomy. Tubular adenoma.    ESOPHAGOGASTRODUODENOSCOPY  12/10/2016   Small hiatal hernia. Mild gastritis. Gastric polyps status post polypectomy x 4    LEFT HEART CATH AND CORONARY ANGIOGRAPHY N/A 02/14/2021   Procedure: LEFT HEART CATH AND CORONARY ANGIOGRAPHY;  Surgeon: Jettie Booze, MD;  Location: [place] [facility];  Service: Cardiovascular;  Laterality: N/A;   MASS EXCISION Left 12/15/2013   Procedure: EXCISION MUCOID TUMOR LEFT INDEX DEBRIDEMENT DISTAL INTERPHALANGEAL JOINT LEFT INDEX FINGER;  Surgeon: Daryll Brod, MD;  Location: [place];  Service: Orthopedics;  Laterality: Left;   MENISCUS REPAIR     outer meniscus   POLYPECTOMY     PROSTATECTOMY  2015   TONSILLECTOMY      Current Medications: No outpatient medications have been marked as taking for the 09/04/21 encounter (Appointment) with Park Liter, MD.     Allergies:   Other and Sulfa antibiotics   Social History   Socioeconomic History   Marital status: Married    Spouse name: Not on file   Number of children: 2   Years of education: Not on file   Highest education level: Not on file  Occupational History   Occupation: Sport and exercise psychologist  Tobacco Use   Smoking status: Never   Smokeless tobacco: Never  Vaping Use   Vaping Use: Never used  Substance and Sexual Activity   Alcohol use: Yes    Comment: occ   Drug use: No   Sexual activity: Not on file  Other Topics Concern   Not on file  Social History Narrative   Not on file   Social Determinants of Health   Financial Resource Strain: Not on file  Food Insecurity: Not on file  Transportation Needs: Not on file  Physical Activity: Not on file  Stress: Not on file  Social Connections: Not on file     Family History: The patient's family history includes Breast cancer in his mother; Colon polyps in his father and mother; Prostate cancer in his father and maternal grandfather. There is no  history of Colon cancer, Esophageal cancer, Rectal cancer, or Stomach cancer. ROS:   Please see the history of present illness.    All 14 point review of systems negative except as described per history of present illness  EKGs/Labs/Other Studies Reviewed:      Recent Labs: 02/11/2021: Hemoglobin 14.1; Platelets 238 07/04/2021: BUN 16; Creatinine, Ser 1.13; Potassium 4.2; Sodium 138  Recent Lipid Panel    Component Value Date/Time   CHOL 169 02/11/2021 1114   TRIG 142 02/11/2021 1114   HDL 32 (L) 02/11/2021 1114   CHOLHDL 5.3 (H) 02/11/2021 1114   LDLCALC 111 (H) 02/11/2021 1114   LDLDIRECT 108 (H) 02/11/2021 1114    Physical Exam:    VS:  There were no vitals taken for this visit.    Wt Readings from Last 3 Encounters:  08/13/21 270 lb (122.5 kg)  05/14/21 264 lb 3.4 oz (119.8 kg)  04/04/21 266 lb 3.2 oz (120.7 kg)  GEN:  Well nourished, well developed in no acute distress HEENT: Normal NECK: No JVD; No carotid bruits LYMPHATICS: No lymphadenopathy CARDIAC: RRR, no murmurs, no rubs, no gallops RESPIRATORY:  Clear to auscultation without rales, wheezing or rhonchi  ABDOMEN: Soft, non-tender, non-distended MUSCULOSKELETAL:  No edema; No deformity  SKIN: Warm and dry LOWER EXTREMITIES: no swelling NEUROLOGIC:  Alert and oriented x 3 PSYCHIATRIC:  Normal affect   ASSESSMENT:    1. Nonischemic cardiomyopathy ([place])   2. Benign essential hypertension   3. Class 2 severe obesity due to excess calories with serious comorbidity and body mass index (BMI) of 35.0 to 35.9 in adult ([place])   4. Dyspnea on exertion   5. 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  He is on guideline directed medical therapy his potassium is fine his kidney function is fine we will continue present management Benign essential hypertension blood pressure  control Obesity it is a problem, he is working on losing weight Dyspnea exertion denies having any ICD present I did review interrogation normal function continue present management   Medication Adjustments/Labs and Tests Ordered: Current medicines are reviewed at length with the patient today.  Concerns regarding medicines are outlined above.  No orders of the defined types were placed in this encounter.  Medication changes: No orders of the defined types were placed in this encounter.   Signed, Park Liter, MD, [facility] 09/04/2021 2:08 PM    [facility]

## 2021-09-05 ENCOUNTER — Encounter: Payer: Self-pay | Admitting: Gastroenterology

## 2021-09-05 ENCOUNTER — Ambulatory Visit (AMBULATORY_SURGERY_CENTER): Payer: PPO | Admitting: Gastroenterology

## 2021-09-05 VITALS — BP 102/56 | HR 60 | Temp 97.5°F | Resp 11 | Wt 270.0 lb

## 2021-09-05 DIAGNOSIS — Z09 Encounter for follow-up examination after completed treatment for conditions other than malignant neoplasm: Secondary | ICD-10-CM | POA: Diagnosis not present

## 2021-09-05 DIAGNOSIS — D125 Benign neoplasm of sigmoid colon: Secondary | ICD-10-CM

## 2021-09-05 DIAGNOSIS — D124 Benign neoplasm of descending colon: Secondary | ICD-10-CM

## 2021-09-05 DIAGNOSIS — Z86010 Personal history of colonic polyps: Secondary | ICD-10-CM | POA: Diagnosis not present

## 2021-09-05 MED ORDER — SODIUM CHLORIDE 0.9 % IV SOLN: 500.0000 mL | Freq: Once | INTRAVENOUS | Status: DC

## 2021-09-05 NOTE — Patient Instructions (Signed)
   Handouts provided about hemorrhoids, diverticulosis and polyps.  Resume previous diet.  Continue present medications.  Await pathology results.  Repeat colonoscopy for surveillance based on pathology results.   YOU HAD AN ENDOSCOPIC PROCEDURE TODAY AT [facility]:   Refer to the procedure report that was given to you for any specific questions about what was found during the examination.  If the procedure report does not answer your questions, please call your gastroenterologist to clarify.  If you requested that your care partner not be given the details of your procedure findings, then the procedure report has been included in a sealed envelope for you to review at your convenience later.  YOU SHOULD EXPECT: Some feelings of bloating in the abdomen. Passage of more gas than usual.  Walking can help get rid of the air that was put into your GI tract during the procedure and reduce the bloating. If you had a lower endoscopy (such as a colonoscopy or flexible sigmoidoscopy) you may notice spotting of blood in your stool or on the toilet paper. If you underwent a bowel prep for your procedure, you may not have a normal bowel movement for a few days.  Please Note:  You might notice some irritation and congestion in your nose or some drainage.  This is from the oxygen used during your procedure.  There is no need for concern and it should clear up in a day or so.  SYMPTOMS TO REPORT IMMEDIATELY:  Following lower endoscopy (colonoscopy or flexible sigmoidoscopy):  Excessive amounts of blood in the stool  Significant tenderness or worsening of abdominal pains  Swelling of the abdomen that is new, acute  Fever of 100F or higher   For urgent or emergent issues, a gastroenterologist can be reached at any hour by calling [phone]. Do not use MyChart messaging for urgent concerns.    DIET:  We do recommend a small meal at first, but then you may proceed to your regular diet.   Drink plenty of fluids but you should avoid alcoholic beverages for 24 hours.  ACTIVITY:  You should plan to take it easy for the rest of today and you should NOT DRIVE or use heavy machinery until tomorrow (because of the sedation medicines used during the test).    FOLLOW UP: Our staff will call the number listed on your records the next business day following your procedure.  We will call around 7:15- 8:00 am to check on you and address any questions or concerns that you may have regarding the information given to you following your procedure. If we do not reach you, we will leave a message.  If you develop any symptoms (ie: fever, flu-like symptoms, shortness of breath, cough etc.) before then, please call [phone].  If you test positive for Covid 19 in the 2 weeks post procedure, please call and report this information to [place].    If any biopsies were taken you will be contacted by phone or by letter within the next 1-3 weeks.  Please call us at [phone] if you have not heard about the biopsies in 3 weeks.    SIGNATURES/CONFIDENTIALITY: You and/or your care partner have signed paperwork which will be entered into your electronic medical record.  These signatures attest to the fact that that the information above on your After Visit Summary has been reviewed and is understood.  Full responsibility of the confidentiality of this discharge information lies with you and/or your care-partner.

## 2021-09-05 NOTE — Progress Notes (Signed)
PT taken to PACU. Monitors in place. VSS. Report given to RN. 

## 2021-09-05 NOTE — Progress Notes (Signed)
IMPRESSION and PLAN:    #1. H/O polyps  Plan: colon      HPI:    Chief Complaint:   Gregrey Bloyd is a 66 y.o. male  Here for colon Doing great. No GI complaints except occasional diarrhea, better with Bentyl.   Adm to [facility] d/t covid-19 4/2-3/53/6144 complicated by severe hyponatremia d/t SIADH, ARF, severe nonischemic cardiomyopathy s/p  ICD 01/2019.  Doing well from cardiac standpoint.  Past GI work-up: -Colonoscopy 09/2016: Colonic polyps s/p polypectomy (TAs), mild sigmoid diverticulosis. Neg random Bx. Next colon due 09/2021.  Had 1st GSKy [facility]  Past Medical History:  Diagnosis Date   Allergy    Anemia    Anxiety    Anxiety disorder 03/20/2015   Asthma    Benign essential hypertension 03/20/2015   Last Assessment & Plan:  Relevant Hx: Course: Daily Update: Today's Plan:this appears stable overall  Electronically signed by: Baldemar Friday, [place] 03/20/15 1509  Last Assessment & Plan:  Formatting of this note might be different from the original. 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     allopurinol (ZYLOPRIM) 300  MG tablet Take 300 mg by mouth in the morning.     aspirin EC 81 MG tablet Take 1 tablet (81 mg total) by mouth daily. Swallow whole. 90 tablet 3   atorvastatin (LIPITOR) 20 MG tablet Take 20 mg by mouth daily.     Black Pepper-Turmeric 3-500 MG CAPS Take 1 capsule by mouth in the morning.     carvedilol (COREG) 12.5 MG tablet Take 12.5 mg by mouth 2 (two) times daily  with a meal.     cetirizine (ZYRTEC) 10 MG tablet Take 10 mg by mouth daily.     dicyclomine (BENTYL) 10 MG capsule Take 1 capsule (10 mg total) by mouth 4 (four) times daily -  before meals and at bedtime. (Patient taking differently: Take 10 mg by mouth in the morning and at bedtime.) 120 capsule 11   fluticasone (FLONASE) 50 MCG/ACT nasal spray Place 1 spray into both nostrils in the morning.     LORazepam (ATIVAN) 1 MG tablet Take 1 mg by mouth daily as needed for anxiety.     omeprazole (PRILOSEC) 20 MG capsule Take 20 mg by mouth daily.     Polyethyl Glycol-Propyl Glycol (SYSTANE ULTRA OP) Place 1 drop into both eyes in the morning.     Probiotic Product (PROBIOTIC PO) Take 1 capsule by mouth daily. Unknown strength     pyridOXINE (VITAMIN B-6) 100 MG tablet Take 200 mg by mouth in the morning.     sacubitril-valsartan (ENTRESTO) 49-51 MG TAKE 1 TABLET BY MOUTH TWICE DAILY. (Patient taking differently: Take 1 tablet by mouth 2 (two) times daily.) 180 tablet 1   spironolactone (ALDACTONE) 25 MG tablet Take 25 mg by mouth daily.      vortioxetine HBr (TRINTELLIX) 20 MG TABS tablet Take 20 mg by mouth every evening.      cyanocobalamin (,VITAMIN B-12,) 1000 MCG/ML injection Inject 1,000 mcg into the muscle every 30 (thirty) days.     fexofenadine (ALLEGRA) 180 MG tablet Take 180 mg by mouth daily.     hyoscyamine (LEVSIN SL) 0.125 MG SL tablet Place 1 tablet (0.125 mg total) under the tongue every 4 (four) hours as needed. (Patient taking differently: Place 0.125 mg under the tongue every 4 (four) hours as needed for cramping.) 120 tablet 8   methocarbamol (ROBAXIN) 500 MG tablet Take 500 mg by mouth 4 (four) times daily as needed for muscle spasms.     neomycin-polymyxin-hydrocortisone (CORTISPORIN) 3.5-10000-1 OTIC suspension Place 3 drops into both ears 3 (three) times daily as needed (pain/itching in ear(s).).     Current Facility-Administered Medications  Medication Dose Route Frequency  Provider Last Rate Last Admin   0.9 %  sodium chloride infusion  500 mL Intravenous Once Jackquline Denmark, MD        Past Surgical History:  Procedure Laterality Date   APPENDECTOMY  1970   BIV ICD INSERTION CRT-D N/A 01/13/2019   Procedure: BIV ICD INSERTION CRT-D;  Surgeon: Constance Haw, MD;  Location: [facility];  Service: Cardiovascular;  Laterality: N/A;   CARPAL TUNNEL RELEASE     COLONOSCOPY  09/24/2016   Colonic polyp status post polypectomy. Tubular adenoma.    ESOPHAGOGASTRODUODENOSCOPY  12/10/2016   Small hiatal hernia. Mild gastritis. Gastric polyps status post polypectomy x 4    LEFT HEART CATH AND CORONARY ANGIOGRAPHY N/A 02/14/2021   Procedure: LEFT HEART CATH AND CORONARY ANGIOGRAPHY;  Surgeon: Jettie Booze, MD;  Location: [facility];  Service: Cardiovascular;  Laterality: N/A;   MASS EXCISION Left 12/15/2013   Procedure: EXCISION MUCOID TUMOR LEFT INDEX DEBRIDEMENT DISTAL INTERPHALANGEAL JOINT LEFT INDEX FINGER;  Surgeon: Daryll Brod, MD;  Location: [facility];  Service: Orthopedics;  Laterality: Left;   MENISCUS REPAIR     outer meniscus   POLYPECTOMY     PROSTATECTOMY  2015   TONSILLECTOMY      Family History  Problem Relation Age of Onset   Colon polyps Mother    Breast cancer Mother    Colon polyps Father    Prostate cancer Father    Prostate cancer Maternal Grandfather    Colon cancer Neg Hx    Esophageal cancer Neg Hx    Rectal cancer Neg Hx    Stomach cancer Neg Hx     Social History   Tobacco Use   Smoking status: Never   Smokeless tobacco: Never  Vaping Use   Vaping Use: Never used  Substance Use Topics   Alcohol use: Yes    Comment: occ   Drug use: No    Allergies  Allergen Reactions   Other Anaphylaxis    TREE NUTS   Sulfa Antibiotics Itching and Rash     Review of Systems: All systems reviewed and negative except where noted in HPI.    Physical Exam:     BP (!) 109/47   Pulse 66    Temp (!) 97.5 F (36.4 C)   Wt 270 lb (122.5 kg)   SpO2 98%   BMI 34.67 kg/m  televisit  Gen: awake, alert, NAD HEENT: anicteric, no pallor CV: RRR, no mrg Pulm: CTA b/l Abd: soft, NT/ND, +BS throughout Ext: no c/c/e Neuro: nonfocal     Ambriella Kitt,MD 09/05/2021, 10:53 AM   CC Raina Mina., MD

## 2021-09-05 NOTE — Op Note (Signed)
[facility] Patient Name: Raymond Alvarez Procedure Date: 09/05/2021 10:58 AM MRN: [id number] Endoscopist: Jackquline Denmark , MD Age: 66 Referring MD:  Date of Birth: 1955-04-18 Gender: Male Account #: [id number] Procedure:                Colonoscopy Indications:              High risk colon cancer surveillance: Personal                            history of colonic polyps Medicines:                Monitored Anesthesia Care Procedure:                Pre-Anesthesia Assessment:                           - Prior to the procedure, a History and Physical                            was performed, and patient medications and                            allergies were reviewed. The patient's tolerance of                            previous anesthesia was also reviewed. The risks                            and benefits of the procedure and the sedation                            options and risks were discussed with the patient.                            All questions were answered, and informed consent                            was obtained. Prior Anticoagulants: The patient has                            taken no previous anticoagulant or antiplatelet                            agents. ASA Grade Assessment: III - A patient with                            severe systemic disease. After reviewing the risks                            and benefits, the patient was deemed in                            satisfactory condition to undergo the procedure.  After obtaining informed consent, the colonoscope                            was passed under direct vision. Throughout the                            procedure, the patient's blood pressure, pulse, and                            oxygen saturations were monitored continuously. The                            CF [id number] #[id number] was introduced through the anus                            and advanced to the the cecum,  identified by                            appendiceal orifice and ileocecal valve. The                            colonoscopy was somewhat difficult due to a                            redundant colon. Successful completion of the                            procedure was aided by applying abdominal pressure.                            The patient tolerated the procedure well. The                            quality of the bowel preparation was good. The                            ileocecal valve, and rectum were photographed. The                            appendiceal orifice could not be photographed as                            the scope fell back into the ascending colon. It                            was visualized. Scope In: 11:02:23 AM Scope Out: 11:19:38 AM Scope Withdrawal Time: 0 hours 10 minutes 26 seconds  Total Procedure Duration: 0 hours 17 minutes 15 seconds  Findings:                 Two sessile polyps were found in the mid sigmoid                            colon and mid descending colon.  The polyps were 6                            mm in size. These polyps were removed with a cold                            snare. Resection and retrieval were complete.                           A few small-mouthed diverticula were found in the                            sigmoid colon.                           Non-bleeding internal hemorrhoids were found during                            retroflexion. The hemorrhoids were moderate and                            Grade I (internal hemorrhoids that do not prolapse).                           The exam was otherwise without abnormality on                            direct and retroflexion views. Complications:            No immediate complications. Estimated Blood Loss:     Estimated blood loss: none. Impression:               - Two 6 mm polyps in the mid sigmoid colon and in                            the mid descending colon, removed with a  cold                            snare. Resected and retrieved.                           - Mild sigmoid diverticulosis.                           - Non-bleeding internal hemorrhoids.                           - The examination was otherwise normal on direct                            and retroflexion views. Recommendation:           - Patient has a contact number available for                            emergencies. The signs and symptoms of potential  delayed complications were discussed with the                            patient. Return to normal activities tomorrow.                            Written discharge instructions were provided to the                            patient.                           - Resume previous diet.                           - Continue present medications.                           - Await pathology results.                           - Repeat colonoscopy for surveillance based on                            pathology results.                           - The findings and recommendations were discussed                            with the patient's family. Jackquline Denmark, MD 09/05/2021 11:31:30 AM This report has been signed electronically.

## 2021-09-05 NOTE — Progress Notes (Signed)
Called to room to assist during endoscopic procedure.  Patient ID and intended procedure confirmed with present staff. Received instructions for my participation in the procedure from the performing physician.  

## 2021-09-06 ENCOUNTER — Ambulatory Visit: Payer: BC Managed Care – PPO | Admitting: Mental Health

## 2021-09-06 ENCOUNTER — Telehealth: Payer: Self-pay | Admitting: *Deleted

## 2021-09-06 NOTE — Telephone Encounter (Signed)
Post procedure follow up call placed, no answer and left VM.  

## 2021-09-13 ENCOUNTER — Encounter: Payer: Self-pay | Admitting: Gastroenterology

## 2021-10-11 ENCOUNTER — Ambulatory Visit (INDEPENDENT_AMBULATORY_CARE_PROVIDER_SITE_OTHER): Payer: PPO

## 2021-10-11 DIAGNOSIS — I428 Other cardiomyopathies: Secondary | ICD-10-CM | POA: Diagnosis not present

## 2021-10-15 LAB — CUP PACEART REMOTE DEVICE CHECK
Battery Remaining Longevity: 53 mo
Battery Remaining Percentage: 62 %
Battery Voltage: 2.95 V
Brady Statistic AP VP Percent: 12 %
Brady Statistic AP VS Percent: 1 %
Brady Statistic AS VP Percent: 88 %
Brady Statistic AS VS Percent: 1 %
Brady Statistic RA Percent Paced: 12 %
Date Time Interrogation Session: 20230908020406
HighPow Impedance: 75 Ohm
Implantable Lead Implant Date: 20201210
Implantable Lead Implant Date: 20201210
Implantable Lead Implant Date: 20201210
Implantable Lead Location: 753858
Implantable Lead Location: 753859
Implantable Lead Location: 753860
Implantable Pulse Generator Implant Date: 20201210
Lead Channel Impedance Value: 360 Ohm
Lead Channel Impedance Value: 430 Ohm
Lead Channel Impedance Value: 440 Ohm
Lead Channel Pacing Threshold Amplitude: 0.5 V
Lead Channel Pacing Threshold Amplitude: 0.75 V
Lead Channel Pacing Threshold Amplitude: 1 V
Lead Channel Pacing Threshold Pulse Width: 0.5 ms
Lead Channel Pacing Threshold Pulse Width: 0.5 ms
Lead Channel Pacing Threshold Pulse Width: 0.5 ms
Lead Channel Sensing Intrinsic Amplitude: 12 mV
Lead Channel Sensing Intrinsic Amplitude: 4.3 mV
Lead Channel Setting Pacing Amplitude: 2 V
Lead Channel Setting Pacing Amplitude: 2.5 V
Lead Channel Setting Pacing Amplitude: 2.5 V
Lead Channel Setting Pacing Pulse Width: 0.5 ms
Lead Channel Setting Pacing Pulse Width: 0.5 ms
Lead Channel Setting Sensing Sensitivity: 0.5 mV
Pulse Gen Serial Number: 111014317

## 2021-10-18 ENCOUNTER — Other Ambulatory Visit: Payer: Self-pay | Admitting: Cardiology

## 2021-10-25 NOTE — Progress Notes (Signed)
Remote ICD transmission.   

## 2021-10-28 ENCOUNTER — Telehealth: Payer: Self-pay

## 2021-10-28 NOTE — Telephone Encounter (Signed)
Prior Auth for Entresto initiated confirmation# [id number]. Determination will be available in 24 -48 hours

## 2021-10-29 NOTE — Telephone Encounter (Signed)
Prior auth approved, letter scanned under media. Patient notified via voicemail.

## 2021-11-11 ENCOUNTER — Ambulatory Visit: Payer: PPO | Admitting: Mental Health

## 2021-11-16 ENCOUNTER — Other Ambulatory Visit: Payer: Self-pay | Admitting: Cardiology

## 2021-11-18 ENCOUNTER — Telehealth: Payer: Self-pay | Admitting: Cardiology

## 2021-11-18 MED ORDER — ENTRESTO 49-51 MG PO TABS: 1.0000 | ORAL_TABLET | Freq: Two times a day (BID) | ORAL | 1 refills | Status: DC

## 2021-11-18 NOTE — Telephone Encounter (Signed)
Entresto refills sent to [facility]

## 2021-11-18 NOTE — Telephone Encounter (Signed)
*  STAT* If patient is at the pharmacy, call can be transferred to refill team.   1. Which medications need to be refilled? (please list name of each medication and dose if known)  sacubitril-valsartan (ENTRESTO) 49-51 MG  2. Which pharmacy/location (including street and city if local pharmacy) is medication to be sent to? [facility] [place], [place]  3. Do they need a 30 day or 90 day supply? [place]

## 2021-11-18 NOTE — Telephone Encounter (Signed)
Rx refill sent to pharmacy. 

## 2021-12-17 ENCOUNTER — Other Ambulatory Visit: Payer: Self-pay | Admitting: Cardiology

## 2021-12-19 ENCOUNTER — Ambulatory Visit: Payer: PPO | Attending: Cardiology

## 2021-12-19 DIAGNOSIS — I428 Other cardiomyopathies: Secondary | ICD-10-CM

## 2021-12-20 LAB — ECHOCARDIOGRAM COMPLETE
Area-P 1/2: 4.39 cm2
S' Lateral: 4 cm

## 2022-01-01 ENCOUNTER — Telehealth: Payer: Self-pay

## 2022-01-01 NOTE — Telephone Encounter (Signed)
Results reviewed with pt as per Dr. Wendy Poet note.  Pt verbalized understanding and had no additional questions. Routed to PCP

## 2022-01-10 ENCOUNTER — Ambulatory Visit (INDEPENDENT_AMBULATORY_CARE_PROVIDER_SITE_OTHER): Payer: PPO

## 2022-01-10 DIAGNOSIS — I428 Other cardiomyopathies: Secondary | ICD-10-CM

## 2022-01-13 LAB — CUP PACEART REMOTE DEVICE CHECK
Battery Remaining Longevity: 49 mo
Battery Remaining Percentage: 58 %
Battery Voltage: 2.95 V
Brady Statistic AP VP Percent: 11 %
Brady Statistic AP VS Percent: 1 %
Brady Statistic AS VP Percent: 89 %
Brady Statistic AS VS Percent: 1 %
Brady Statistic RA Percent Paced: 11 %
Date Time Interrogation Session: 20231211103030
HighPow Impedance: 71 Ohm
Implantable Lead Connection Status: 753985
Implantable Lead Connection Status: 753985
Implantable Lead Connection Status: 753985
Implantable Lead Implant Date: 20201210
Implantable Lead Implant Date: 20201210
Implantable Lead Implant Date: 20201210
Implantable Lead Location: 753858
Implantable Lead Location: 753859
Implantable Lead Location: 753860
Implantable Pulse Generator Implant Date: 20201210
Lead Channel Impedance Value: 400 Ohm
Lead Channel Impedance Value: 430 Ohm
Lead Channel Impedance Value: 430 Ohm
Lead Channel Pacing Threshold Amplitude: 0.5 V
Lead Channel Pacing Threshold Amplitude: 0.75 V
Lead Channel Pacing Threshold Amplitude: 1 V
Lead Channel Pacing Threshold Pulse Width: 0.5 ms
Lead Channel Pacing Threshold Pulse Width: 0.5 ms
Lead Channel Pacing Threshold Pulse Width: 0.5 ms
Lead Channel Sensing Intrinsic Amplitude: 12 mV
Lead Channel Sensing Intrinsic Amplitude: 4.7 mV
Lead Channel Setting Pacing Amplitude: 2 V
Lead Channel Setting Pacing Amplitude: 2.5 V
Lead Channel Setting Pacing Amplitude: 2.5 V
Lead Channel Setting Pacing Pulse Width: 0.5 ms
Lead Channel Setting Pacing Pulse Width: 0.5 ms
Lead Channel Setting Sensing Sensitivity: 0.5 mV
Pulse Gen Serial Number: 111014317
Zone Setting Status: 755011

## 2022-01-22 ENCOUNTER — Telehealth: Payer: Self-pay | Admitting: Cardiology

## 2022-01-22 NOTE — Telephone Encounter (Signed)
Patient stated a 1" long lump has appeared above his device.  Patient stated is developed last week and he wants to know if this is normal.

## 2022-01-23 NOTE — Telephone Encounter (Signed)
Spoke with patient.  He describes an area to the L of his chest just above his ICD site as: A tender lump/bump (not a pimple)  1 inch in diameter - shaped like a Tylenol capsule Area is hard, doesn't seem fluid filled  Very tender to touch Present X 1.5 weeks.  Seems slightly better but does have sharp, needlelike pain up to shoulder at times.   Denies any fever, chills, drainage, pimple or pus like appearance Warmth to touch but denies any redness.   Patient is unable to upload a picture through mychart.   Will need to come in to assess. Scheduled tomorrow 01/24/22 on device schedule to look at healed wound site.  This is not a new implant.  He has had device since 2020.

## 2022-01-24 ENCOUNTER — Ambulatory Visit: Payer: PPO | Attending: Cardiology

## 2022-01-24 DIAGNOSIS — I428 Other cardiomyopathies: Secondary | ICD-10-CM

## 2022-01-24 NOTE — Progress Notes (Signed)
Pt seen in clinic d/t concerns about his device site.  He states it has been sore, but that has resolved.  He thinks he may have pulled a muscle picking up his grandchild.  Palpated site.  The bump he was feeling is the header on the can.  Had Pt palpate and showed him an example of what his device looks like.  Pt relieved.  No concerns with device site.

## 2022-01-24 NOTE — Telephone Encounter (Signed)
Pt scheduled to be seen.

## 2022-01-31 NOTE — Progress Notes (Signed)
Remote ICD transmission.   

## 2022-04-11 ENCOUNTER — Ambulatory Visit (INDEPENDENT_AMBULATORY_CARE_PROVIDER_SITE_OTHER): Payer: PPO

## 2022-04-11 DIAGNOSIS — I428 Other cardiomyopathies: Secondary | ICD-10-CM

## 2022-04-14 DIAGNOSIS — I428 Other cardiomyopathies: Secondary | ICD-10-CM | POA: Diagnosis not present

## 2022-04-14 LAB — CUP PACEART REMOTE DEVICE CHECK
Battery Remaining Longevity: 47 mo
Battery Remaining Percentage: 56 %
Battery Voltage: 2.95 V
Brady Statistic AP VP Percent: 11 %
Brady Statistic AP VS Percent: 1 %
Brady Statistic AS VP Percent: 89 %
Brady Statistic AS VS Percent: 1 %
Brady Statistic RA Percent Paced: 11 %
Date Time Interrogation Session: 20240308020044
HighPow Impedance: 72 Ohm
Implantable Lead Connection Status: 753985
Implantable Lead Connection Status: 753985
Implantable Lead Connection Status: 753985
Implantable Lead Implant Date: 20201210
Implantable Lead Implant Date: 20201210
Implantable Lead Implant Date: 20201210
Implantable Lead Location: 753858
Implantable Lead Location: 753859
Implantable Lead Location: 753860
Implantable Pulse Generator Implant Date: 20201210
Lead Channel Impedance Value: 390 Ohm
Lead Channel Impedance Value: 410 Ohm
Lead Channel Impedance Value: 440 Ohm
Lead Channel Pacing Threshold Amplitude: 0.5 V
Lead Channel Pacing Threshold Amplitude: 0.75 V
Lead Channel Pacing Threshold Amplitude: 1 V
Lead Channel Pacing Threshold Pulse Width: 0.5 ms
Lead Channel Pacing Threshold Pulse Width: 0.5 ms
Lead Channel Pacing Threshold Pulse Width: 0.5 ms
Lead Channel Sensing Intrinsic Amplitude: 12 mV
Lead Channel Sensing Intrinsic Amplitude: 4.2 mV
Lead Channel Setting Pacing Amplitude: 2 V
Lead Channel Setting Pacing Amplitude: 2.5 V
Lead Channel Setting Pacing Amplitude: 2.5 V
Lead Channel Setting Pacing Pulse Width: 0.5 ms
Lead Channel Setting Pacing Pulse Width: 0.5 ms
Lead Channel Setting Sensing Sensitivity: 0.5 mV
Pulse Gen Serial Number: 111014317
Zone Setting Status: 755011

## 2022-04-30 ENCOUNTER — Other Ambulatory Visit: Payer: Self-pay | Admitting: Cardiology

## 2022-05-08 ENCOUNTER — Ambulatory Visit (INDEPENDENT_AMBULATORY_CARE_PROVIDER_SITE_OTHER): Payer: PPO | Admitting: Mental Health

## 2022-05-08 DIAGNOSIS — F411 Generalized anxiety disorder: Secondary | ICD-10-CM | POA: Diagnosis not present

## 2022-05-08 NOTE — Progress Notes (Signed)
[facility] Initial Adult Exam  Name: Raymond Alvarez Date: 05/08/2022 MRN: [id number] DOB: April 14, 1955 PCP: Jackquline Denmark, MD  Time spent: 52 minutes  Treatment:   ind. Therapy  Mental Status Exam:    Appearance:    Casual     Behavior:   Appropriate  Motor:   WNL  Speech/Language:    Clear and Coherent  Affect:   Full range   Mood:   Euthymic  Thought process:   Logical, linear, goal directed  Thought content:     WNL  Sensory/Perceptual disturbances:     none  Orientation:   x4  Attention:   Good  Concentration:   Good  Memory:   Intact  Fund of knowledge:    Consistent with age and development  Insight:     Good  Judgment:    Good  Impulse Control:   Good     Reported Symptoms: Anxiety, rumination, depressed mood  Risk Assessment: Danger to Self:  No Self-injurious Behavior: No Danger to Others: No Duty to Warn:no Physical Aggression / Violence:No  Access to Firearms a concern: No  Gang Involvement:No  Patient / guardian was educated about steps to take if suicide or homicide risk level increases between visits: yes While future psychiatric events cannot be accurately predicted, the patient does not currently require acute inpatient psychiatric care and does not currently meet [facility] involuntary commitment criteria.    Subjective: Patient presented for session on time. Its been almost one year since his last visit. he  discussed challenges managing stress related to his adult son's behavior issues stemming from bipolar disorder diagnosed in his twenties. He expressed difficulty not reacting to his son's irritability. He also shared the long-term challenges of coping with his son's mood issues. Positive Developments: He reported finding joy in spending time with his grandchildren as he and his wife approach retirement. Additional Considerations: He also mentioned managing his own medical conditions and medication adherence. Continued exploration of  coping mechanisms for managing stress related to his son's behavior. Explore support systems available to client. Discuss self-care strategies to ensure client's well-being.   Diagnoses:    ICD-10-CM   1. Anxiety state  F41.1         Plan: Patient is to use CBT, mindfulness and coping skills to help manage / decrease symptoms associated with their diagnosis.   Long-term goals:   Maintain symptom reduction: The patient will report sustained reduction in symptoms of anxiety using both CBT and mindfulness interventions for 3 consecutive months progressively.  Improve emotional regulation: The patient will learn and apply CBT and mindfulness-based strategies to regulate emotions, such as mindfulness-based stress reduction and cognitive restructuring, and report an improvement in emotional regulation for at least 3 consecutive months progressively.    Short-term goal:  The patient will learn and apply CBT and mindfulness-based coping skills for managing anxiety and practice using it between sessions.       2.   The patient will CBT and mindfulness-based interventions to increase awareness of negative thought patterns.       3.   Increase awareness of automatic thoughts: The patient will learn to identify automatic thoughts and increase awareness of their impact on emotions and behaviors through both CBT and mindfulness-based interventions.       [place], [facility]

## 2022-05-09 NOTE — Progress Notes (Signed)
Remote ICD transmission.   

## 2022-05-25 NOTE — Progress Notes (Signed)
 " Cardiology Office Note:    Date:  05/26/2022   ID:  Raymond Alvarez, DOB January 14, 1956, MRN [id number]  PCP:  Raymond Groom, MD   [facility] Providers Cardiologist:  None Electrophysiologist:  Raymond Gladis Norton, MD     Referring MD: Raymond Groom, MD   CC: follow up CAD  History of Present Illness:    Raymond Alvarez is a 67 y.o. male with a hx of NICM, CRT-D ICD in place 2020, hypertension, CAD, PVCs, asthma, OSA, GERD, NASH, CKD, prostate cancer, depression, anxiety.  He underwent MPI on 02/05/2021 which was an intermediate risk, findings consistent with ischemia involving the inferior wall.  LHC on 02/14/2021 revealed distal RCA lesion 10% stenosed, minimal nonobstructive disease in the RCA.  Echo completed on 12/19/2021 revealed an EF of 45 to 50%, mildly decreased function, mild LVH, grade 2 DD mild MR.  Remote device check on 04/11/2022 was normal, battery and lead parameters are stable.  Most recently evaluated by Dr. Bernie on 09/04/2021, at that time he was doing well from a cardiac perspective.  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  He reports that he feels relatively well overall.  He reports  episodes that can occur a few times a week, he describes as dizziness, diaphoretic, feeling like he might pass out, states they could last 5 to 10 minutes.  He reports these have been consistent over the last several years.  We did discuss that his blood pressure is on the low side at 100/70, he reports these are typical of his home blood pressure readings.  We discussed decreasing his Entresto  or his Coreg  dosing however he is hesitant to make any changes to them's medications.  His wife is a teacher, early years/pre, and he would like to discuss with her. He denies chest pain, palpitations, dyspnea, pnd, orthopnea, n, v, dizziness, syncope, edema, weight gain, or early satiety.   Past Medical History:  Diagnosis Date   Allergy    Anemia    Anxiety    Anxiety disorder  03/20/2015   Asthma    Benign essential hypertension 03/20/2015   Last Assessment & Plan:  Relevant Hx: Course: Daily Update: Today's Plan:this appears stable overall  Electronically signed by: Raymond Dariel Sayres, FNP 03/20/15 1509  Last Assessment & Plan:  Formatting of this note might be different from the original. Relevant Hx: Course: Daily Update: Today's Plan:this appears stable overall  Electronically signed by: Raymond Dariel Sayres, FNP 03/20/15    CHF (congestive heart failure)    Chronic conjunctivitis of left eye 08/25/2019   Chronic kidney disease    Chronic systolic heart failure 09/12/2019   Class 2 severe obesity due to excess calories with serious comorbidity and body mass index (BMI) of 35.0 to 35.9 in adult 03/20/2015   Colon polyp    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   Depression    Dilated cardiomyopathy 07/15/2018   Ejection fraction 30 to 35% based on echo in June 2020  Formatting of this note might be different from the original. Ejection fraction 30 to 35% based on echo in June 2020   Diverticulitis    Dyspnea on exertion 06/25/2018   Erectile dysfunction 11/10/2019   Gastroesophageal reflux disease without esophagitis 07/31/2016   GERD (gastroesophageal reflux disease)    Gout    History of 2019 novel coronavirus disease (COVID-19) 05/12/2018   History of prostate cancer 11/10/2019   Hypertension    Hyponatremia 05/11/2018   Intrinsic  asthma without complication 03/20/2015   Last Assessment & Plan:  Relevant Hx: Course: Daily Update: Today's Plan:this appears stable overall, Raymond get kenalog to help hasten symtpoms  Electronically signed by: Raymond Dariel Sayres, FNP 03/20/15 1510  Last Assessment & Plan:  Formatting of this note might be different from the original. 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   Palpitations 03/20/2015   Prediabetes 06/17/2018   Prostate cancer    Pulmonary nodule 05/12/2018   PVC (premature ventricular contraction) 03/20/2015   Rhabdomyolysis 05/12/2018   Seasonal allergies    Sleep apnea    Suspected COVID-19 virus infection 05/12/2018   Viral gastroenteritis 05/12/2018   Wears glasses     Past Surgical History:  Procedure Laterality Date   APPENDECTOMY  1970   BIV ICD INSERTION CRT-D N/A 01/13/2019   Procedure: BIV ICD INSERTION CRT-D;  Surgeon: Raymond Soyla Lunger, MD;  Location: [facility] INVASIVE CV LAB;  Service: Cardiovascular;  Laterality: N/A;   CARPAL TUNNEL RELEASE     COLONOSCOPY  09/24/2016   Colonic polyp status post polypectomy. Tubular adenoma.    ESOPHAGOGASTRODUODENOSCOPY  12/10/2016   Small hiatal hernia. Mild gastritis. Gastric polyps status post polypectomy x 4    LEFT HEART CATH AND CORONARY ANGIOGRAPHY N/A 02/14/2021   Procedure: LEFT HEART CATH AND CORONARY ANGIOGRAPHY;  Surgeon: Raymond Candyce RAMAN, MD;  Location: [facility] INVASIVE CV LAB;  Service: Cardiovascular;  Laterality: N/A;   MASS EXCISION Left 12/15/2013   Procedure: EXCISION MUCOID TUMOR LEFT INDEX DEBRIDEMENT DISTAL INTERPHALANGEAL JOINT LEFT INDEX FINGER;  Surgeon: Raymond Curia, MD;  Location: [facility];  Service: Orthopedics;  Laterality: Left;   MENISCUS REPAIR     outer meniscus   POLYPECTOMY     PROSTATECTOMY  2015   TONSILLECTOMY      Current Medications: Current Meds  Medication Sig   Albuterol  Sulfate (PROAIR  RESPICLICK) 108 (90 Base) MCG/ACT AEPB Inhale 1-2 puffs into the lungs every 6 (six) hours as needed (wheezing/shortness of breath).   allopurinol (ZYLOPRIM) 300 MG tablet Take 300 mg by  mouth in the morning.   aspirin  EC 81 MG tablet Take 1 tablet (81 mg total) by mouth daily. Swallow whole.   atorvastatin  (LIPITOR) 20 MG tablet Take 1 tablet (20 mg total) by mouth daily.   Black Pepper-Turmeric 3-500 MG CAPS Take 1 capsule by mouth in the morning.   carvedilol  (COREG ) 12.5 MG tablet TAKE 1 TABLET(12.5 MG) BY MOUTH TWICE DAILY WITH A MEAL   cetirizine (ZYRTEC) 10 MG tablet Take 10 mg by mouth daily.   cyanocobalamin (,VITAMIN B-12,) 1000 MCG/ML injection Inject 1,000 mcg into the muscle every 30 (thirty) days.   dicyclomine  (BENTYL ) 10 MG capsule Take 1 capsule (10 mg total) by mouth 4 (four) times daily -  before meals and at bedtime. (Patient taking differently: Take 10 mg by mouth in the morning and at bedtime.)   fexofenadine (ALLEGRA) 180 MG tablet Take 180 mg by mouth daily.   fluticasone (FLONASE) 50 MCG/ACT nasal spray Place 1 spray into both nostrils in the morning.   hyoscyamine  (LEVSIN  SL) 0.125 MG SL tablet Place 1 tablet (0.125 mg total)  under the tongue every 4 (four) hours as needed. (Patient taking differently: Place 0.125 mg under the tongue every 4 (four) hours as needed for cramping.)   LORazepam  (ATIVAN ) 1 MG tablet Take 1 mg by mouth daily as needed for anxiety.   methocarbamol (ROBAXIN) 500 MG tablet Take 500 mg by mouth 4 (four) times daily as needed for muscle spasms.   neomycin-polymyxin-hydrocortisone (CORTISPORIN) 3.5-10000-1 OTIC suspension Place 3 drops into both ears 3 (three) times daily as needed (pain/itching in ear(s).).   omeprazole (PRILOSEC) 20 MG capsule Take 20 mg by mouth daily.   Polyethyl Glycol-Propyl Glycol (SYSTANE ULTRA OP) Place 1 drop into both eyes in the morning.   Probiotic Product (PROBIOTIC PO) Take 1 capsule by mouth daily. Unknown strength   pyridOXINE (VITAMIN B-6) 100 MG tablet Take 200 mg by mouth in the morning.   sacubitril -valsartan  (ENTRESTO ) 49-51 MG Take 1 tablet by mouth 2 (two) times daily.   spironolactone  (ALDACTONE) 25 MG tablet Take 25 mg by mouth daily.    vortioxetine HBr (TRINTELLIX) 20 MG TABS tablet Take 20 mg by mouth every evening.      Allergies:   Other and Sulfa antibiotics   Social History   Socioeconomic History   Marital status: Married    Spouse name: Not on file   Number of children: 2   Years of education: Not on file   Highest education level: Not on file  Occupational History   Occupation: passenger transport manager  Tobacco Use   Smoking status: Never   Smokeless tobacco: Never  Vaping Use   Vaping Use: Never used  Substance and Sexual Activity   Alcohol use: Yes    Comment: occ   Drug use: No   Sexual activity: Not on file  Other Topics Concern   Not on file  Social History Narrative   Not on file   Social Determinants of Health   Financial Resource Strain: Not on file  Food Insecurity: Not on file  Transportation Needs: Not on file  Physical Activity: Not on file  Stress: Not on file  Social Connections: Not on file     Family History: The patient's family history includes Breast cancer in his mother; Colon polyps in his father and mother; Prostate cancer in his father and maternal grandfather. There is no history of Colon cancer, Esophageal cancer, Rectal cancer, or Stomach cancer.  ROS:   Please see the history of present illness.     All other systems reviewed and are negative.  EKGs/Labs/Other Studies Reviewed:    The following studies were reviewed today: Cardiac Studies & Procedures   CARDIAC CATHETERIZATION  CARDIAC CATHETERIZATION 02/14/2021  Narrative   Dist RCA lesion is 10% stenosed.   There is mild left ventricular systolic dysfunction.   LV end diastolic pressure is normal.  LVEDP 10 mmHg.   The left ventricular ejection fraction is 50-55% by visual estimate.   There is no aortic valve stenosis.   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  Consider alternative access if  catheterization needed in the future.  Otherwise, could consider destination sheath.  No angiographically apparent coronary artery disease in the left system.  Minimal, nonobstructive disease in the RCA.  Low normal LVEF.  Continue medical therapy.  Results discussed with his wife, Rumalda [phone].  Findings Coronary Findings Diagnostic  Dominance: Right  Right Coronary Artery Dist RCA lesion is 10% stenosed.  Intervention  No interventions have been documented.  STRESS TESTS  MYOCARDIAL PERFUSION IMAGING 02/05/2021  Narrative   Findings are consistent with ischemia involving inferior wall. The study is intermediate risk.   No ST deviation was noted.   Left ventricular function is abnormal. Global function is mildly reduced. Nuclear stress EF: 53 %. The left ventricular ejection fraction is mildly decreased (45-54%). End diastolic cavity size is normal.   Prior study not available for comparison.   ECHOCARDIOGRAM  ECHOCARDIOGRAM COMPLETE 12/20/2021  Narrative ECHOCARDIOGRAM REPORT    Patient Name:   DALBERT STILLINGS Date of Exam: 12/19/2021 Medical Rec #:  [id number]     Height:       74.0 in Accession #:    [id number]    Weight:       270.0 lb Date of Birth:  [birth year]     BSA:          2.469 m Patient Age:    66 years      BP:           120/80 mmHg Patient Gender: M             HR:           75 bpm. Exam Location:  [place]  Procedure: 2D Echo, Cardiac Doppler, Color Doppler and Strain Analysis  Indications:    Nonischemic cardiomyopathy (HCC) [I42.8 (ICD-10-CM)]  History:        Patient has prior history of Echocardiogram examinations, most recent 12/04/2020. Cardiomyopathy and CHF, CAD, Arrythmias:PVC; Risk Factors:Hypertension.  Sonographer:    Charlie Jointer RDCS Referring Phys: [id number] ROBERT J KRASOWSKI  IMPRESSIONS   1. GLS -13.7. Left ventricular ejection fraction, by estimation, is 45 to 50%. The left ventricle has mildly decreased function. The left  ventricle has no regional wall motion abnormalities. There is mild left ventricular hypertrophy. Left ventricular diastolic parameters are consistent with Grade II diastolic dysfunction (pseudonormalization). 2. Right ventricular systolic function is normal. The right ventricular size is normal. There is normal pulmonary artery systolic pressure. 3. The mitral valve is normal in structure. Mild mitral valve regurgitation. No evidence of mitral stenosis. 4. The aortic valve is normal in structure. Aortic valve regurgitation is not visualized. No aortic stenosis is present. 5. The inferior vena cava is normal in size with greater than 50% respiratory variability, suggesting right atrial pressure of 3 mmHg.  FINDINGS Left Ventricle: GLS -13.7. Left ventricular ejection fraction, by estimation, is 45 to 50%. The left ventricle has mildly decreased function. The left ventricle has no regional wall motion abnormalities. The left ventricular internal cavity size was normal in size. There is mild left ventricular hypertrophy. Left ventricular diastolic parameters are consistent with Grade II diastolic dysfunction (pseudonormalization).  Right Ventricle: The right ventricular size is normal. No increase in right ventricular wall thickness. Right ventricular systolic function is normal. There is normal pulmonary artery systolic pressure. The tricuspid regurgitant velocity is 2.36 m/s, and with an assumed right atrial pressure of 3 mmHg, the estimated right ventricular systolic pressure is 25.3 mmHg.  Left Atrium: Left atrial size was normal in size.  Right Atrium: Right atrial size was normal in size.  Pericardium: There is no evidence of pericardial effusion.  Mitral Valve: The mitral valve is normal in structure. Mild mitral valve regurgitation. No evidence of mitral valve stenosis.  Tricuspid Valve: The tricuspid valve is normal in structure. Tricuspid valve regurgitation is mild . No evidence of  tricuspid stenosis.  Aortic Valve: The aortic valve is normal in structure. Aortic valve regurgitation is  not visualized. No aortic stenosis is present.  Pulmonic Valve: The pulmonic valve was normal in structure. Pulmonic valve regurgitation is not visualized. No evidence of pulmonic stenosis.  Aorta: The aortic root is normal in size and structure.  Venous: The inferior vena cava is normal in size with greater than 50% respiratory variability, suggesting right atrial pressure of 3 mmHg.  IAS/Shunts: No atrial level shunt detected by color flow Doppler.  Additional Comments: A device lead is visualized in the right ventricle.   LEFT VENTRICLE PLAX 2D LVIDd:         5.30 cm   Diastology LVIDs:         4.00 cm   LV e' medial:    4.90 cm/s LV PW:         1.30 cm   LV E/e' medial:  14.6 LV IVS:        1.30 cm   LV e' lateral:   6.53 cm/s LVOT diam:     2.30 cm   LV E/e' lateral: 11.0 LV SV:         59 LV SV Index:   24 LVOT Area:     4.15 cm   RIGHT VENTRICLE             IVC RV Basal diam:  3.10 cm     IVC diam: 2.00 cm RV S prime:     11.00 cm/s TAPSE (M-mode): 2.3 cm  LEFT ATRIUM             Index        RIGHT ATRIUM           Index LA diam:        4.20 cm 1.70 cm/m   RA Area:     16.90 cm LA Vol (A2C):   43.7 ml 17.70 ml/m  RA Volume:   38.80 ml  15.71 ml/m LA Vol (A4C):   63.5 ml 25.72 ml/m LA Biplane Vol: 54.0 ml 21.87 ml/m AORTIC VALVE LVOT Vmax:   78.30 cm/s LVOT Vmean:  52.800 cm/s LVOT VTI:    0.142 m  AORTA Ao Root diam: 3.80 cm Ao Asc diam:  3.30 cm Ao Desc diam: 2.80 cm  MITRAL VALVE               TRICUSPID VALVE MV Area (PHT): 4.39 cm    TR Peak grad:   22.3 mmHg MV Decel Time: 173 msec    TR Vmax:        236.00 cm/s MV E velocity: 71.70 cm/s MV A velocity: 68.40 cm/s  SHUNTS MV E/A ratio:  1.05        Systemic VTI:  0.14 m Systemic Diam: 2.30 cm  Lamar Fitch MD Electronically signed by Lamar Fitch MD Signature Date/Time:  12/20/2021/5:18:26 PM    Final              EKG:  EKG is  ordered today.  The ekg ordered today demonstrates atrial sensed ventricular paced, heart rate 70 bpm, consistent with previous EKG reading.  Recent Labs: 07/04/2021: BUN 16; Creatinine, Ser 1.13; Potassium 4.2; Sodium 138  Recent Lipid Panel    Component Value Date/Time   CHOL 169 02/11/2021 1114   TRIG 142 02/11/2021 1114   HDL 32 (L) 02/11/2021 1114   CHOLHDL 5.3 (H) 02/11/2021 1114   LDLCALC 111 (H) 02/11/2021 1114   LDLDIRECT 108 (H) 02/11/2021 1114     Risk Assessment/Calculations:  Physical Exam:    VS:  BP 100/70 (BP Location: Right Arm, Patient Position: Sitting, Cuff Size: Normal)   Pulse 70   Ht 6' 2 (1.88 m)   Wt 281 lb (127.5 kg)   SpO2 97%   BMI 36.08 kg/m     Wt Readings from Last 3 Encounters:  05/26/22 281 lb (127.5 kg)  09/05/21 270 lb (122.5 kg)  09/04/21 273 lb 6.4 oz (124 kg)     GEN:  Well nourished, well developed in no acute distress HEENT: Normal NECK: No JVD; No carotid bruits LYMPHATICS: No lymphadenopathy CARDIAC: RRR, no murmurs, rubs, gallops RESPIRATORY:  Clear to auscultation without rales, wheezing or rhonchi  ABDOMEN: Soft, non-tender, non-distended MUSCULOSKELETAL:  No edema; No deformity  SKIN: Warm and dry NEUROLOGIC:  Alert and oriented x 3 PSYCHIATRIC:  Normal affect   ASSESSMENT:    1. Nonischemic cardiomyopathy   2. Benign essential hypertension   3. 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 Coronary artery disease involving native coronary artery of native heart without angina pectoris   5. Hyperlipidemia, unspecified hyperlipidemia type    PLAN:    In order of problems listed above:  CAD-LHC on 02/14/2021 revealed minimal, nonobstructive CAD in the RCA. Stable with no anginal symptoms. No indication for ischemic evaluation.  Continue aspirin  81 mg daily, continue Lipitor 20 mg daily, continue Coreg  12.5 mg twice  daily. HLD-LDL 108 on 02/15/2021.  Would like to see his LDL less than 70, this is managed by his PCP Raymond see her in a few weeks for FLP and other blood work. NICM/HFmrEF-most recent echo revealed a EF 45 to 50%, NYHA class I, euvolemic.  Continue Entresto  49-51 mg twice daily, spironolactone 25 mg daily, Coreg  12.5 mg twice daily.  I do not think his blood pressure can tolerate any further addition of GDMT.  We discussed decreasing his Coreg  to 6.25 mg twice daily or decreasing his Entresto  to 24-26 mg twice daily, however he is very hesitant to make any changes to his current medication regimen.  Encouraged him to discuss with his wife, who is a teacher, early years/pre, as his blood pressure is low. CRT-D-most recent device check was normal, battery and lead parameters stable. Hypertension-blood pressure is 100/70, he reports readings in the 90s systolic.  He also endorses  episodes where he Raymond feel dizzy, become diaphoretic, near syncope that sound to be most consistent with orthostasis.  He is very adamant that at this time he does not want to change any of his medication regimens.  For now, continue carvedilol  12.5 mg twice daily, Entresto  49-51 mg twice daily.   Disposition-return in 6 months.       Medication Adjustments/Labs and Tests Ordered: Current medicines are reviewed at length with the patient today.  Concerns regarding medicines are outlined above.  Orders Placed This Encounter  Procedures   EKG 12-Lead   No orders of the defined types were placed in this encounter.   Patient Instructions  Medication Instructions:  Your physician recommends that you continue on your current medications as directed. Please refer to the Current Medication list given to you today.  *If you need a refill on your cardiac medications before your next appointment, please call your pharmacy*   Lab Work: NONE If you have labs (blood work) drawn today and your tests are completely normal, you Raymond receive  your results only by: MyChart Message (if you have MyChart) OR A paper copy in the mail If you  have any lab test that is abnormal or we need to change your treatment, we Raymond call you to review the results.   Testing/Procedures: NONE   Follow-Up: At [facility], you and your health needs are our priority.  As part of our continuing mission to provide you with exceptional heart care, we have created designated Provider Care Teams.  These Care Teams include your primary Cardiologist (physician) and Advanced Practice Providers (APPs -  Physician Assistants and Nurse Practitioners) who all work together to provide you with the care you need, when you need it.  We recommend signing up for the patient portal called MyChart.  Sign up information is provided on this After Visit Summary.  MyChart is used to connect with patients for Virtual Visits (Telemedicine).  Patients are able to view lab/test results, encounter notes, upcoming appointments, etc.  Non-urgent messages can be sent to your provider as well.   To learn more about what you can do with MyChart, go to [email or web address].    Your next appointment:   6 month(s)  Provider:   Lamar Fitch, MD    Other Instructions     Signed, Delon JAYSON Hoover, NP  05/26/2022 12:20 PM    [facility] "

## 2022-05-26 ENCOUNTER — Ambulatory Visit: Payer: PPO | Attending: Cardiology | Admitting: Cardiology

## 2022-05-26 ENCOUNTER — Encounter: Payer: Self-pay | Admitting: Cardiology

## 2022-05-26 VITALS — BP 100/70 | HR 70 | Ht 74.0 in | Wt 281.0 lb

## 2022-05-26 DIAGNOSIS — I10 Essential (primary) hypertension: Secondary | ICD-10-CM

## 2022-05-26 DIAGNOSIS — Z95810 Presence of automatic (implantable) cardiac defibrillator: Secondary | ICD-10-CM | POA: Diagnosis not present

## 2022-05-26 DIAGNOSIS — I428 Other cardiomyopathies: Secondary | ICD-10-CM | POA: Diagnosis not present

## 2022-05-26 DIAGNOSIS — I2510 Atherosclerotic heart disease of native coronary artery without angina pectoris: Secondary | ICD-10-CM | POA: Diagnosis not present

## 2022-05-26 DIAGNOSIS — E785 Hyperlipidemia, unspecified: Secondary | ICD-10-CM

## 2022-05-26 NOTE — Patient Instructions (Signed)
Medication Instructions:  Your physician recommends that you continue on your current medications as directed. Please refer to the Current Medication list given to you today.  *If you need a refill on your cardiac medications before your next appointment, please call your pharmacy*   Lab Work: NONE If you have labs (blood work) drawn today and your tests are completely normal, you will receive your results only by: MyChart Message (if you have MyChart) OR A paper copy in the mail If you have any lab test that is abnormal or we need to change your treatment, we will call you to review the results.   Testing/Procedures: NONE   Follow-Up: At [facility], you and your health needs are our priority.  As part of our continuing mission to provide you with exceptional heart care, we have created designated Provider Care Teams.  These Care Teams include your primary Cardiologist (physician) and Advanced Practice Providers (APPs -  Physician Assistants and Nurse Practitioners) who all work together to provide you with the care you need, when you need it.  We recommend signing up for the patient portal called "MyChart".  Sign up information is provided on this After Visit Summary.  MyChart is used to connect with patients for Virtual Visits (Telemedicine).  Patients are able to view lab/test results, encounter notes, upcoming appointments, etc.  Non-urgent messages can be sent to your provider as well.   To learn more about what you can do with MyChart, go to [email or web address].    Your next appointment:   6 month(s)  Provider:   Robert Krasowski, MD    Other Instructions   

## 2022-05-27 ENCOUNTER — Ambulatory Visit (INDEPENDENT_AMBULATORY_CARE_PROVIDER_SITE_OTHER): Payer: PPO | Admitting: Mental Health

## 2022-05-27 DIAGNOSIS — F411 Generalized anxiety disorder: Secondary | ICD-10-CM

## 2022-05-27 NOTE — Progress Notes (Signed)
[facility] psychotherapy note  Name: Lannie Yusuf Date: 05/27/2022 MRN: [id number] DOB: Jul 28, 1955 PCP: Lynann Bologna, MD  Time spent: 52 minutes  Treatment:   ind. Therapy  Mental Status Exam:    Appearance:    Casual     Behavior:   Appropriate  Motor:   WNL  Speech/Language:    Clear and Coherent  Affect:   Full range   Mood:   Euthymic  Thought process:   Logical, linear, goal directed  Thought content:     WNL  Sensory/Perceptual disturbances:     none  Orientation:   x4  Attention:   Good  Concentration:   Good  Memory:   Intact  Fund of knowledge:    Consistent with age and development  Insight:     Good  Judgment:    Good  Impulse Control:   Good     Reported Symptoms: Anxiety, rumination, depressed mood  Risk Assessment: Danger to Self:  No Self-injurious Behavior: No Danger to Others: No Duty to Warn:no Physical Aggression / Violence:No  Access to Firearms a concern: No  Gang Involvement:No  Patient / guardian was educated about steps to take if suicide or homicide risk level increases between visits: yes While future psychiatric events cannot be accurately predicted, the patient does not currently require acute inpatient psychiatric care and does not currently meet [facility] involuntary commitment criteria.    Subjective: Patient presented for session on time.  Assessed events, progress since last visit.  Patient shared more significant family history.  He shared events leading up to the passing of his mother-in-law about 15 years ago, interactions with his wife's siblings, challenges at that time related to having to navigate some of the relational issues with her family.  He stated that they were overall close to her mother, cared for her during her later years prior to her passing.  He stated they also cared for his mother who passed in 2016 prior to her passing as well. Through guided discovery, he identified the need to maintain a boundary  going forward to provide support to additional family members as needed, such as some of her siblings who are aging, however, that they need to refrain from such an active role in the future, going on to share reasons.  Explored with patient ways he finds it helpful to destress with pleasurable activities and other ways to cope and care for himself.  Diagnoses:    ICD-10-CM   1. Anxiety state  F41.1          Plan: Patient is to use CBT, mindfulness and coping skills to help manage / decrease symptoms associated with their diagnosis.   Long-term goals:   Maintain symptom reduction: The patient will report sustained reduction in symptoms of anxiety using both CBT and mindfulness interventions for 3 consecutive months progressively.  Improve emotional regulation: The patient will learn and apply CBT and mindfulness-based strategies to regulate emotions, such as mindfulness-based stress reduction and cognitive restructuring, and report an improvement in emotional regulation for at least 3 consecutive months progressively.    Short-term goal:  The patient will learn and apply CBT and mindfulness-based coping skills for managing anxiety and practice using it between sessions.       2.   The patient will CBT and mindfulness-based interventions to increase awareness of negative thought patterns.       3.   Increase awareness of automatic thoughts: The patient will learn to identify automatic thoughts and  increase awareness of their impact on emotions and behaviors through both CBT and mindfulness-based interventions.       Waldron Session, [facility]

## 2022-06-11 ENCOUNTER — Ambulatory Visit (INDEPENDENT_AMBULATORY_CARE_PROVIDER_SITE_OTHER): Payer: PPO | Admitting: Mental Health

## 2022-06-11 DIAGNOSIS — F411 Generalized anxiety disorder: Secondary | ICD-10-CM | POA: Diagnosis not present

## 2022-06-11 NOTE — Progress Notes (Signed)
[facility] psychotherapy note  Name: Raymond Alvarez Date: 06/11/22 MRN: [id number] DOB: 06/22/1955 PCP: Raymond Bologna, MD  Time spent: 52 minutes  Treatment:   ind. Therapy  Mental Status Exam:    Appearance:    Casual     Behavior:   Appropriate  Motor:   WNL  Speech/Language:    Clear and Coherent  Affect:   Full range   Mood:   Euthymic  Thought process:   Logical, linear, goal directed  Thought content:     WNL  Sensory/Perceptual disturbances:     none  Orientation:   x4  Attention:   Good  Concentration:   Good  Memory:   Intact  Fund of knowledge:    Consistent with age and development  Insight:     Good  Judgment:    Good  Impulse Control:   Good     Reported Symptoms: Anxiety, rumination, depressed mood  Risk Assessment: Danger to Self:  No Self-injurious Behavior: No Danger to Others: No Duty to Warn:no Physical Aggression / Violence:No  Access to Firearms a concern: No  Gang Involvement:No  Patient / guardian was educated about steps to take if suicide or homicide risk level increases between visits: yes While future psychiatric events cannot be accurately predicted, the patient does not currently require acute inpatient psychiatric care and does not currently meet [facility] involuntary commitment criteria.    Subjective: Patient presented for session on time.  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  He went on to share how his son, who is also a Runner, broadcasting/film/video, was on our interview recently and experienced what patient feels were some inappropriate questions.  Patient stated this was upsetting and he voiced this to his son while also encouraging him to make his own decision about the job.  He went on to share more  details related to the relationship with his son over the years, challenges with his son in part due to his coping with bipolar.  Ways he has been effective with communicating with his son over the years and how their relationship has improved was shared. Ways to cope and care for himself, trying to manage stress and anxiety between visits was explored and discussed diaphragmatic breathing with mindfulness to utilize.  Interventions: Supportive therapy, CBT  Diagnoses:    ICD-10-CM   1. Anxiety state  F41.1           Plan: Patient is to use CBT, mindfulness and coping skills to help manage / decrease symptoms associated with their diagnosis.   Long-term goals:   Maintain symptom reduction: The patient will report sustained reduction in symptoms of anxiety using both CBT and mindfulness interventions for 3 consecutive months progressively.  Improve emotional regulation: The patient will learn and apply CBT and mindfulness-based strategies to regulate emotions, such as mindfulness-based stress reduction and cognitive restructuring, and report an improvement in emotional regulation for at least 3 consecutive months progressively.    Short-term goal:  The patient will learn and apply CBT and mindfulness-based coping skills for managing anxiety and practice using it between sessions.       2.   The patient will CBT and mindfulness-based interventions to increase awareness of negative thought patterns.  3.   The patient will learn to identify automatic thoughts and increase awareness of their impact on emotions and behaviors through both CBT and mindfulness-based interventions.       Waldron Session, [facility]

## 2022-07-11 ENCOUNTER — Ambulatory Visit (INDEPENDENT_AMBULATORY_CARE_PROVIDER_SITE_OTHER): Payer: PPO | Admitting: Mental Health

## 2022-07-11 ENCOUNTER — Ambulatory Visit (INDEPENDENT_AMBULATORY_CARE_PROVIDER_SITE_OTHER): Payer: PPO

## 2022-07-11 DIAGNOSIS — I428 Other cardiomyopathies: Secondary | ICD-10-CM

## 2022-07-11 DIAGNOSIS — F411 Generalized anxiety disorder: Secondary | ICD-10-CM

## 2022-07-11 LAB — CUP PACEART REMOTE DEVICE CHECK
Battery Remaining Longevity: 44 mo
Battery Remaining Percentage: 53 %
Battery Voltage: 2.93 V
Brady Statistic AP VP Percent: 12 %
Brady Statistic AP VS Percent: 1 %
Brady Statistic AS VP Percent: 88 %
Brady Statistic AS VS Percent: 1 %
Brady Statistic RA Percent Paced: 12 %
Date Time Interrogation Session: 20240607070529
HighPow Impedance: 75 Ohm
Implantable Lead Connection Status: 753985
Implantable Lead Connection Status: 753985
Implantable Lead Connection Status: 753985
Implantable Lead Implant Date: 20201210
Implantable Lead Implant Date: 20201210
Implantable Lead Implant Date: 20201210
Implantable Lead Location: 753858
Implantable Lead Location: 753859
Implantable Lead Location: 753860
Implantable Pulse Generator Implant Date: 20201210
Lead Channel Impedance Value: 380 Ohm
Lead Channel Impedance Value: 430 Ohm
Lead Channel Impedance Value: 430 Ohm
Lead Channel Pacing Threshold Amplitude: 0.5 V
Lead Channel Pacing Threshold Amplitude: 0.75 V
Lead Channel Pacing Threshold Amplitude: 1 V
Lead Channel Pacing Threshold Pulse Width: 0.5 ms
Lead Channel Pacing Threshold Pulse Width: 0.5 ms
Lead Channel Pacing Threshold Pulse Width: 0.5 ms
Lead Channel Sensing Intrinsic Amplitude: 12 mV
Lead Channel Sensing Intrinsic Amplitude: 4.3 mV
Lead Channel Setting Pacing Amplitude: 2 V
Lead Channel Setting Pacing Amplitude: 2.5 V
Lead Channel Setting Pacing Amplitude: 2.5 V
Lead Channel Setting Pacing Pulse Width: 0.5 ms
Lead Channel Setting Pacing Pulse Width: 0.5 ms
Lead Channel Setting Sensing Sensitivity: 0.5 mV
Pulse Gen Serial Number: 111014317
Zone Setting Status: 755011

## 2022-07-11 NOTE — Progress Notes (Signed)
[facility] psychotherapy note  Name: Raymond Alvarez Date: 07/11/22 MRN: [id number] DOB: 1955-08-14 PCP: Lynann Bologna, MD  Time spent: 51 minutes  Treatment:   ind. Therapy  Mental Status Exam:    Appearance:    Casual     Behavior:   Appropriate  Motor:   WNL  Speech/Language:    Clear and Coherent  Affect:   Full range   Mood:   Euthymic  Thought process:   Logical, linear, goal directed  Thought content:     WNL  Sensory/Perceptual disturbances:     none  Orientation:   x4  Attention:   Good  Concentration:   Good  Memory:   Intact  Fund of knowledge:    Consistent with age and development  Insight:     Good  Judgment:    Good  Impulse Control:   Good     Reported Symptoms: Anxiety, rumination, depressed mood  Risk Assessment: Danger to Self:  No Self-injurious Behavior: No Danger to Others: No Duty to Warn:no Physical Aggression / Violence:No  Access to Firearms a concern: No  Gang Involvement:No  Patient / guardian was educated about steps to take if suicide or homicide risk level increases between visits: yes While future psychiatric events cannot be accurately predicted, the patient does not currently require acute inpatient psychiatric care and does not currently meet [facility] involuntary commitment criteria.    Subjective: Patient presented for session on time. He identified feeling like a failure, particularly in the eyes of his parents due to perceived favoritism towards his sister. 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 Guided exploration revealed nighttime as the most difficult period, with increased sadness and decreased motivation. Client identified his parents, particularly his father, as the source of the most hurtful criticism. Developed coping mechanisms with patient for managing nighttime sadness and low motivation.  Assisted him in identifying specific  thoughts and worked with him to reframe. Encouraged him to monitor and take note of thoughts and feelings between visits.   Interventions: Supportive therapy, CBT  Diagnoses:    ICD-10-CM   1. Anxiety state  F41.1            Plan: Patient is to use CBT, mindfulness and coping skills to help manage / decrease symptoms associated with their diagnosis.   Long-term goals:   Maintain symptom reduction: The patient will report sustained reduction in symptoms of anxiety using both CBT and mindfulness interventions for 3 consecutive months progressively.  Improve emotional regulation: The patient will learn and apply CBT and mindfulness-based strategies to regulate emotions, such as mindfulness-based stress reduction and cognitive restructuring, and report an improvement in emotional regulation for at least 3 consecutive months progressively.    Short-term goal:  The patient will learn and apply CBT and mindfulness-based coping skills for managing anxiety and practice using it between sessions.       2.   The patient will CBT and mindfulness-based interventions to increase awareness of negative thought patterns.       3.   The patient will learn to identify automatic thoughts and increase awareness of their impact on emotions and behaviors through both CBT and mindfulness-based interventions.       Waldron Session, [facility]

## 2022-07-18 DIAGNOSIS — I428 Other cardiomyopathies: Secondary | ICD-10-CM | POA: Diagnosis not present

## 2022-07-24 ENCOUNTER — Other Ambulatory Visit: Payer: Self-pay | Admitting: Cardiology

## 2022-07-29 NOTE — Progress Notes (Signed)
Remote ICD transmission.   

## 2022-08-06 ENCOUNTER — Other Ambulatory Visit: Payer: Self-pay | Admitting: Cardiology

## 2022-08-14 ENCOUNTER — Ambulatory Visit: Payer: PPO | Admitting: Mental Health

## 2022-08-28 ENCOUNTER — Ambulatory Visit: Payer: PPO | Admitting: Mental Health

## 2022-09-17 ENCOUNTER — Ambulatory Visit (INDEPENDENT_AMBULATORY_CARE_PROVIDER_SITE_OTHER): Payer: PPO | Admitting: Mental Health

## 2022-09-17 DIAGNOSIS — F411 Generalized anxiety disorder: Secondary | ICD-10-CM | POA: Diagnosis not present

## 2022-09-17 NOTE — Progress Notes (Signed)
[facility] psychotherapy note  Name: Raymond Alvarez Date:  09/17/22 MRN: [id number] DOB: January 07, 1956 PCP: Lynann Bologna, MD  Time spent: 50 minutes  Treatment:   ind. Therapy  Mental Status Exam:    Appearance:    Casual     Behavior:   Appropriate  Motor:   WNL  Speech/Language:    Clear and Coherent  Affect:   Full range   Mood:   Euthymic  Thought process:   Logical, linear, goal directed  Thought content:     WNL  Sensory/Perceptual disturbances:     none  Orientation:   x4  Attention:   Good  Concentration:   Good  Memory:   Intact  Fund of knowledge:    Consistent with age and development  Insight:     Good  Judgment:    Good  Impulse Control:   Good     Reported Symptoms: Anxiety, rumination, depressed mood  Risk Assessment: Danger to Self:  No Self-injurious Behavior: No Danger to Others: No Duty to Warn:no Physical Aggression / Violence:No  Access to Firearms a concern: No  Gang Involvement:No  Patient / guardian was educated about steps to take if suicide or homicide risk level increases between visits: yes While future psychiatric events cannot be accurately predicted, the patient does not currently require acute inpatient psychiatric care and does not currently meet [facility] involuntary commitment criteria.    Subjective: Patient presented for session on time.  He discussed recent events, progress.  He shared how he continues to have rumination at night, copes with more sadness, decreased motivation, self depreciating thoughts.  He shared more past experiences related to family, being raised by his parents but closest to his grandmother lives nearby.  Stated his father was often critical, even when given a complement it would be followed with a critical or negative statement toward patient.  His father passed away in 2013/09/23, appearing never to have an open discussion with him about how he feels however noting that he was a good grandfather, often  spending time with his grandchildren.  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  Ways to do so were explored collaboratively.  Interventions: Supportive therapy, CBT  Diagnoses:    ICD-10-CM   1. Anxiety state  F41.1        Plan: Patient is to use CBT, mindfulness and coping skills to help manage / decrease symptoms associated with their diagnosis.   Long-term goals:   Maintain symptom reduction: The patient will report sustained reduction in symptoms of anxiety using both CBT and mindfulness interventions for 3 consecutive months progressively.  Improve emotional regulation: The patient will learn and apply CBT and mindfulness-based strategies to regulate emotions, such as mindfulness-based stress reduction and cognitive restructuring, and report an improvement in emotional regulation for at least 3 consecutive months progressively.    Short-term goal:  The patient will learn and apply CBT and mindfulness-based coping skills for managing anxiety and practice using it between sessions.       2.   The patient will CBT and mindfulness-based interventions to increase awareness of negative thought patterns.       3.   The patient will learn to identify automatic thoughts and increase  awareness of their impact on emotions and behaviors through both CBT and mindfulness-based interventions.       Waldron Session, [facility]

## 2022-09-30 ENCOUNTER — Other Ambulatory Visit (HOSPITAL_COMMUNITY): Payer: Self-pay

## 2022-09-30 ENCOUNTER — Telehealth: Payer: Self-pay

## 2022-09-30 NOTE — Telephone Encounter (Signed)
   Per test claim: No P/A is needed at this time. The current copay is 94.00$ for a 90day supply.

## 2022-10-01 NOTE — Telephone Encounter (Signed)
Patient notified of status

## 2022-10-10 ENCOUNTER — Ambulatory Visit (INDEPENDENT_AMBULATORY_CARE_PROVIDER_SITE_OTHER): Payer: PPO

## 2022-10-10 DIAGNOSIS — I428 Other cardiomyopathies: Secondary | ICD-10-CM

## 2022-10-10 LAB — CUP PACEART REMOTE DEVICE CHECK
Battery Remaining Longevity: 41 mo
Battery Remaining Percentage: 49 %
Battery Voltage: 2.93 V
Brady Statistic AP VP Percent: 12 %
Brady Statistic AP VS Percent: 1 %
Brady Statistic AS VP Percent: 88 %
Brady Statistic AS VS Percent: 1 %
Brady Statistic RA Percent Paced: 11 %
Date Time Interrogation Session: 20240906020610
HighPow Impedance: 81 Ohm
Implantable Lead Connection Status: 753985
Implantable Lead Connection Status: 753985
Implantable Lead Connection Status: 753985
Implantable Lead Implant Date: 20201210
Implantable Lead Implant Date: 20201210
Implantable Lead Implant Date: 20201210
Implantable Lead Location: 753858
Implantable Lead Location: 753859
Implantable Lead Location: 753860
Implantable Pulse Generator Implant Date: 20201210
Lead Channel Impedance Value: 380 Ohm
Lead Channel Impedance Value: 430 Ohm
Lead Channel Impedance Value: 430 Ohm
Lead Channel Pacing Threshold Amplitude: 0.5 V
Lead Channel Pacing Threshold Amplitude: 0.75 V
Lead Channel Pacing Threshold Amplitude: 1 V
Lead Channel Pacing Threshold Pulse Width: 0.5 ms
Lead Channel Pacing Threshold Pulse Width: 0.5 ms
Lead Channel Pacing Threshold Pulse Width: 0.5 ms
Lead Channel Sensing Intrinsic Amplitude: 12 mV
Lead Channel Sensing Intrinsic Amplitude: 4.6 mV
Lead Channel Setting Pacing Amplitude: 2 V
Lead Channel Setting Pacing Amplitude: 2.5 V
Lead Channel Setting Pacing Amplitude: 2.5 V
Lead Channel Setting Pacing Pulse Width: 0.5 ms
Lead Channel Setting Pacing Pulse Width: 0.5 ms
Lead Channel Setting Sensing Sensitivity: 0.5 mV
Pulse Gen Serial Number: 111014317
Zone Setting Status: 755011

## 2022-10-14 NOTE — Progress Notes (Signed)
Remote ICD transmission.   

## 2022-10-15 ENCOUNTER — Ambulatory Visit: Payer: PPO | Admitting: Mental Health

## 2022-10-15 DIAGNOSIS — F411 Generalized anxiety disorder: Secondary | ICD-10-CM

## 2022-10-15 NOTE — Progress Notes (Signed)
[facility] psychotherapy note  Name: Raymond Alvarez Date:  10/15/22 MRN: [id number] DOB: 10/27/1955 PCP: Lynann Bologna, MD  Time spent: 51 minutes  Treatment:   ind. Therapy  Mental Status Exam:    Appearance:    Casual     Behavior:   Appropriate  Motor:   WNL  Speech/Language:    Clear and Coherent  Affect:   Full range   Mood:   Euthymic  Thought process:   Logical, linear, goal directed  Thought content:     WNL  Sensory/Perceptual disturbances:     none  Orientation:   x4  Attention:   Good  Concentration:   Good  Memory:   Intact  Fund of knowledge:    Consistent with age and development  Insight:     Good  Judgment:    Good  Impulse Control:   Good     Reported Symptoms: Anxiety, rumination, depressed mood  Risk Assessment: Danger to Self:  No Self-injurious Behavior: No Danger to Others: No Duty to Warn:no Physical Aggression / Violence:No  Access to Firearms a concern: No  Gang Involvement:No  Patient / guardian was educated about steps to take if suicide or homicide risk level increases between visits: yes While future psychiatric events cannot be accurately predicted, the patient does not currently require acute inpatient psychiatric care and does not currently meet [facility] involuntary commitment criteria.    Subjective: Patient presented for session on time.  Patient shared progress, how he continues to have difficulty in the evenings primarily with thoughts about the past, self depreciating thoughts where he may feel less motivated as well.  He shared his efforts to continue to work on his thinking, recognizing past thoughts related to how he felt through different experiences with his father.  He stated that he is continue to work on managing his thoughts, reframing them and focusing on how his parents did the best they could as opposed to focusing on blame.  He shared how he has continued to adjust to retirement, the challenges of his  contracting COVID a few years ago which ultimately led to his early retirement and need for disability designation.  Interventions: Supportive therapy, CBT  Diagnoses:    ICD-10-CM   1. Anxiety state  F41.1         Plan: Patient is to use CBT, mindfulness and coping skills to help manage / decrease symptoms associated with their diagnosis.   Long-term goals:   Maintain symptom reduction: The patient will report sustained reduction in symptoms of anxiety using both CBT and mindfulness interventions for 3 consecutive months progressively.  Improve emotional regulation: The patient will learn and apply CBT and mindfulness-based strategies to regulate emotions, such as mindfulness-based stress reduction and cognitive restructuring, and report an improvement in emotional regulation for at least 3 consecutive months progressively.    Short-term goal:  The patient will learn and apply CBT and mindfulness-based coping skills for managing anxiety and practice using it between sessions.       2.   The patient will CBT and mindfulness-based interventions to increase awareness of negative thought patterns.       3.   The patient will learn to identify automatic thoughts and increase awareness of their impact on emotions and behaviors through both CBT and mindfulness-based interventions.       Waldron Session, [facility]

## 2022-10-29 ENCOUNTER — Ambulatory Visit: Payer: PPO | Admitting: Mental Health

## 2022-10-29 DIAGNOSIS — I428 Other cardiomyopathies: Secondary | ICD-10-CM | POA: Diagnosis not present

## 2022-11-01 ENCOUNTER — Other Ambulatory Visit: Payer: Self-pay | Admitting: Cardiology

## 2022-11-03 ENCOUNTER — Encounter: Payer: Self-pay | Admitting: *Deleted

## 2022-11-12 ENCOUNTER — Ambulatory Visit: Payer: PPO | Admitting: Mental Health

## 2022-11-12 DIAGNOSIS — F411 Generalized anxiety disorder: Secondary | ICD-10-CM

## 2022-11-12 NOTE — Progress Notes (Signed)
[facility] psychotherapy note  Name: Raymond Alvarez Date:  11/12/22 MRN: [id number] DOB: Jul 04, 1955 PCP: Lynann Bologna, MD  Time spent: 50 minutes  Treatment:   ind. Therapy  Mental Status Exam:    Appearance:    Casual     Behavior:   Appropriate  Motor:   WNL  Speech/Language:    Clear and Coherent  Affect:   Full range   Mood:   Euthymic  Thought process:   Logical, linear, goal directed  Thought content:     WNL  Sensory/Perceptual disturbances:     none  Orientation:   x4  Attention:   Good  Concentration:   Good  Memory:   Intact  Fund of knowledge:    Consistent with age and development  Insight:     Good  Judgment:    Good  Impulse Control:   Good     Reported Symptoms: Anxiety, rumination, depressed mood  Risk Assessment: Danger to Self:  No Self-injurious Behavior: No Danger to Others: No Duty to Warn:no Physical Aggression / Violence:No  Access to Firearms a concern: No  Gang Involvement:No  Patient / guardian was educated about steps to take if suicide or homicide risk level increases between visits: yes While future psychiatric events cannot be accurately predicted, the patient does not currently require acute inpatient psychiatric care and does not currently meet [facility] involuntary commitment criteria.    Subjective: Patient presented for session on time.  Assessed progress in recent events.  Patient stated that he had a difficult day yesterday, unsure how he had a "meltdown".  He stated that he felt stressed in the morning as he was in his garage, having to take care of multiple tasks and projects. Share how he feels he is often at the center of everyone's needs going on to share various family members that rely on him often.  He stated that he wants to be there for family and this is not his nature however, the need to set boundaries at times was identified as a need.  He shared more insights related to his marriage, challenges they have  had over the years where he felt emasculated at times by his wife however, he stated that over the last few years their relationship has improved significantly.  He shared more background regarding her past, growing up which contributed to her tendency to be directive, this combined with his own past of not feeling like he is "enough" to his father as causal factors for his tendencies to feel inadequate at times.  Collaboratively, we explored ways to cope and manage with stress associated with setting boundaries, stating how he feels more openly and more frequently.  Interventions: Supportive therapy, CBT  Diagnoses:    ICD-10-CM   1. Anxiety state  F41.1          Plan: Patient is to use CBT, mindfulness and coping skills to help manage / decrease symptoms associated with their diagnosis.   Long-term goals:   Maintain symptom reduction: The patient will report sustained reduction in symptoms of anxiety using both CBT and mindfulness interventions for 3 consecutive months progressively.  Improve emotional regulation: The patient will learn and apply CBT and mindfulness-based strategies to regulate emotions, such as mindfulness-based stress reduction and cognitive restructuring, and report an improvement in emotional regulation for at least 3 consecutive months progressively.    Short-term goal:  The patient will learn and apply CBT and mindfulness-based coping skills for managing anxiety and practice  using it between sessions.       2.   The patient will CBT and mindfulness-based interventions to increase awareness of negative thought patterns.       3.   The patient will learn to identify automatic thoughts and increase awareness of their impact on emotions and behaviors through both CBT and mindfulness-based interventions.       Waldron Session, [facility]

## 2022-11-17 ENCOUNTER — Encounter: Payer: Self-pay | Admitting: Cardiology

## 2022-11-17 ENCOUNTER — Ambulatory Visit: Payer: PPO | Attending: Cardiology | Admitting: Cardiology

## 2022-11-17 VITALS — BP 112/72 | HR 73 | Ht 74.0 in | Wt 285.2 lb

## 2022-11-17 DIAGNOSIS — I10 Essential (primary) hypertension: Secondary | ICD-10-CM | POA: Diagnosis not present

## 2022-11-17 DIAGNOSIS — I5022 Chronic systolic (congestive) heart failure: Secondary | ICD-10-CM

## 2022-11-17 LAB — CUP PACEART INCLINIC DEVICE CHECK
Battery Remaining Longevity: 40 mo
Brady Statistic RA Percent Paced: 12 %
Brady Statistic RV Percent Paced: 99.94 %
Date Time Interrogation Session: 20241014151026
HighPow Impedance: 72 Ohm
Implantable Lead Connection Status: 753985
Implantable Lead Connection Status: 753985
Implantable Lead Connection Status: 753985
Implantable Lead Implant Date: 20201210
Implantable Lead Implant Date: 20201210
Implantable Lead Implant Date: 20201210
Implantable Lead Location: 753858
Implantable Lead Location: 753859
Implantable Lead Location: 753860
Implantable Pulse Generator Implant Date: 20201210
Lead Channel Impedance Value: 400 Ohm
Lead Channel Impedance Value: 425 Ohm
Lead Channel Impedance Value: 475 Ohm
Lead Channel Pacing Threshold Amplitude: 0.5 V
Lead Channel Pacing Threshold Amplitude: 0.5 V
Lead Channel Pacing Threshold Amplitude: 0.75 V
Lead Channel Pacing Threshold Amplitude: 0.75 V
Lead Channel Pacing Threshold Amplitude: 1.5 V
Lead Channel Pacing Threshold Amplitude: 1.5 V
Lead Channel Pacing Threshold Pulse Width: 0.5 ms
Lead Channel Pacing Threshold Pulse Width: 0.5 ms
Lead Channel Pacing Threshold Pulse Width: 0.5 ms
Lead Channel Pacing Threshold Pulse Width: 0.5 ms
Lead Channel Pacing Threshold Pulse Width: 0.5 ms
Lead Channel Pacing Threshold Pulse Width: 0.5 ms
Lead Channel Sensing Intrinsic Amplitude: 12 mV
Lead Channel Sensing Intrinsic Amplitude: 4.5 mV
Lead Channel Setting Pacing Amplitude: 2 V
Lead Channel Setting Pacing Amplitude: 2.5 V
Lead Channel Setting Pacing Amplitude: 2.5 V
Lead Channel Setting Pacing Pulse Width: 0.5 ms
Lead Channel Setting Pacing Pulse Width: 0.5 ms
Lead Channel Setting Sensing Sensitivity: 0.5 mV
Pulse Gen Serial Number: 111014317
Zone Setting Status: 755011

## 2022-11-17 NOTE — Progress Notes (Signed)
Electrophysiology Office Note:   Date:  11/17/2022  ID:  Raymond Alvarez, DOB 02/20/1955, MRN [id number]  Primary Cardiologist: Gypsy Balsam, MD Electrophysiologist: Regan Lemming, MD      History of Present Illness:   Raymond Alvarez is a 67 y.o. male with h/o chronic systolic heart failure, CKD, hypertension seen today for routine electrophysiology followup.   Since last being seen in our clinic the patient reports doing well.  He has no chest pain or shortness of breath.  He is able to do all of his daily activities without restriction.  He does state that when he gets hot, he has to rest more often.  Otherwise no acute complaints.  he denies chest pain, palpitations, dyspnea, PND, orthopnea, nausea, vomiting, dizziness, syncope, edema, weight gain, or early satiety.   Review of systems complete and found to be negative unless listed in HPI.      EP Information / Studies Reviewed:    EKG is ordered today. Personal review as below.      ICD Interrogation-  reviewed in detail today,  See PACEART report.  Device History: Abbott BiV ICD implanted 01/13/2019 for CHF History of appropriate therapy: No History of AAD therapy: No   Risk Assessment/Calculations:              Physical Exam:   VS:  BP 112/72   Pulse 73   Ht 6\' 2"  (1.88 m)   Wt 285 lb 3.2 oz (129.4 kg)   SpO2 98%   BMI 36.62 kg/m    Wt Readings from Last 3 Encounters:  11/17/22 285 lb 3.2 oz (129.4 kg)  05/26/22 281 lb (127.5 kg)  09/05/21 270 lb (122.5 kg)     GEN: Well nourished, well developed in no acute distress NECK: No JVD; No carotid bruits CARDIAC: Regular rate and rhythm, no murmurs, rubs, gallops RESPIRATORY:  Clear to auscultation without rales, wheezing or rhonchi  ABDOMEN: Soft, non-tender, non-distended EXTREMITIES:  No edema; No deformity   ASSESSMENT AND PLAN:    Chronic systolic dysfunction s/p Abbott CRT-D  euvolemic today Stable on an appropriate medical regimen Normal  ICD function See Pace Art report No changes today  2.  Hypertension: Currently well-controlled  Disposition:   Follow up with Dr. Elberta Fortis in 12 months   Signed, Lorenna Lurry Jorja Loa, MD

## 2022-11-17 NOTE — Patient Instructions (Signed)
Medication Instructions:  Your physician recommends that you continue on your current medications as directed. Please refer to the Current Medication list given to you today.  *If you need a refill on your cardiac medications before your next appointment, please call your pharmacy*   Lab Work: None ordered   Testing/Procedures: None ordered   Follow-Up: At [facility], you and your health needs are our priority.  As part of our continuing mission to provide you with exceptional heart care, we have created designated Provider Care Teams.  These Care Teams include your primary Cardiologist (physician) and Advanced Practice Providers (APPs -  Physician Assistants and Nurse Practitioners) who all work together to provide you with the care you need, when you need it.  Remote monitoring is used to monitor your Pacemaker or ICD from home. This monitoring reduces the number of office visits required to check your device to one time per year. It allows [place] to keep an eye on the functioning of your device to ensure it is working properly. You are scheduled for a device check from home on 01/09/2023. You may send your transmission at any time that day. If you have a wireless device, the transmission will be sent automatically. After your physician reviews your transmission, you will receive a postcard with your next transmission date.  Your next appointment:   1 year(s)  The format for your next appointment:   In Person  Provider:   Loman Brooklyn, MD    Thank you for choosing [facility]!!   Dory Horn, RN [phone]

## 2022-11-19 ENCOUNTER — Telehealth: Payer: Self-pay | Admitting: Cardiology

## 2022-11-19 NOTE — Telephone Encounter (Signed)
Called the patient and informed him that his Zetia medication did not need a PA per the message below from our PA team:  Patient was going to call his pharmacy now and I asked him to give [place] a call back if he has anymore issues getting this medication.  Note    Per test claim: No P/A is needed at this time. The current copay is 94.00$ for a 90day supply.

## 2022-11-19 NOTE — Telephone Encounter (Signed)
Pt c/o medication issue:  1. Name of Medication:   ENTRESTO 49-51 MG    2. How are you currently taking this medication (dosage and times per day)?   TAKE 1 TABLET BY MOUTH TWICE DAILY    3. Are you having a reaction (difficulty breathing--STAT)? No  4. What is your medication issue? Pt calling to f/u on Prior Auth that pharmacy said they sent over. Pt would like a callback regarding this matter so that he is able to get medication. Please advise

## 2022-11-20 ENCOUNTER — Other Ambulatory Visit (HOSPITAL_COMMUNITY): Payer: Self-pay

## 2022-11-20 ENCOUNTER — Telehealth: Payer: Self-pay | Admitting: Pharmacy Technician

## 2022-11-20 NOTE — Telephone Encounter (Signed)
Pt is requesting a callback from nurse regarding prior auth. Please advise

## 2022-11-20 NOTE — Telephone Encounter (Signed)
Pharmacy Patient Advocate Encounter   Received notification from Pt Calls Messages that prior authorization for entresto is required/requested.   Insurance verification completed.   The patient is insured through [facility] .   Per test claim: PA required; PA submitted to [facility] via CoverMyMeds Key/confirmation #/EOC [id number] Status is pending

## 2022-11-21 ENCOUNTER — Other Ambulatory Visit (HOSPITAL_COMMUNITY): Payer: Self-pay

## 2022-11-21 NOTE — Telephone Encounter (Signed)
Pharmacy Patient Advocate Encounter  Received notification from [facility] that Prior Authorization for entresto has been APPROVED from 11/20/22 to 11/19/23   PA #/Case ID/Reference #: [id number]

## 2022-11-23 ENCOUNTER — Other Ambulatory Visit: Payer: Self-pay | Admitting: Cardiology

## 2022-11-24 ENCOUNTER — Encounter: Payer: Self-pay | Admitting: Cardiology

## 2022-11-25 ENCOUNTER — Ambulatory Visit: Payer: PPO | Attending: Cardiology | Admitting: Cardiology

## 2022-11-25 ENCOUNTER — Encounter: Payer: Self-pay | Admitting: Cardiology

## 2022-11-25 VITALS — BP 110/74 | HR 76 | Ht 74.0 in | Wt 287.0 lb

## 2022-11-25 DIAGNOSIS — I2510 Atherosclerotic heart disease of native coronary artery without angina pectoris: Secondary | ICD-10-CM

## 2022-11-25 DIAGNOSIS — I10 Essential (primary) hypertension: Secondary | ICD-10-CM

## 2022-11-25 DIAGNOSIS — I429 Cardiomyopathy, unspecified: Secondary | ICD-10-CM

## 2022-11-25 DIAGNOSIS — I5022 Chronic systolic (congestive) heart failure: Secondary | ICD-10-CM

## 2022-11-25 DIAGNOSIS — Z95810 Presence of automatic (implantable) cardiac defibrillator: Secondary | ICD-10-CM

## 2022-11-25 NOTE — Patient Instructions (Addendum)
Medication Instructions:  Your physician recommends that you continue on your current medications as directed. Please refer to the Current Medication list given to you today.  *If you need a refill on your cardiac medications before your next appointment, please call your pharmacy*   Lab Work: None If you have labs (blood work) drawn today and your tests are completely normal, you will receive your results only by: MyChart Message (if you have MyChart) OR A paper copy in the mail If you have any lab test that is abnormal or we need to change your treatment, we will call you to review the results.   Testing/Procedures: Your physician has requested that you have an echocardiogram. Echocardiography is a painless test that uses sound waves to create images of your heart. It provides your doctor with information about the size and shape of your heart and how well your heart's chambers and valves are working. This procedure takes approximately one hour. There are no restrictions for this procedure.    Follow-Up: At [facility], you and your health needs are our priority.  As part of our continuing mission to provide you with exceptional heart care, we have created designated Provider Care Teams.  These Care Teams include your primary Cardiologist (physician) and Advanced Practice Providers (APPs -  Physician Assistants and Nurse Practitioners) who all work together to provide you with the care you need, when you need it.  We recommend signing up for the patient portal called "MyChart".  Sign up information is provided on this After Visit Summary.  MyChart is used to connect with patients for Virtual Visits (Telemedicine).  Patients are able to view lab/test results, encounter notes, upcoming appointments, etc.  Non-urgent messages can be sent to your provider as well.   To learn more about what you can do with MyChart, go to [email or web address].     Other Instructions Echocardiogram An  echocardiogram is a test that uses sound waves (ultrasound) to produce images of the heart. Images from an echocardiogram can provide important information about: Heart size and shape. The size and thickness and movement of your heart's walls. Heart muscle function and strength. Heart valve function or if you have stenosis. Stenosis is when the heart valves are too narrow. If blood is flowing backward through the heart valves (regurgitation). A tumor or infectious growth around the heart valves. Areas of heart muscle that are not working well because of poor blood flow or injury from a heart attack. Aneurysm detection. An aneurysm is a weak or damaged part of an artery wall. The wall bulges out from the normal force of blood pumping through the body. Tell a health care provider about: Any allergies you have. All medicines you are taking, including vitamins, herbs, eye drops, creams, and over-the-counter medicines. Any blood disorders you have. Any surgeries you have had. Any medical conditions you have. Whether you are pregnant or may be pregnant. What are the risks? Generally, this is a safe test. However, problems may occur, including an allergic reaction to dye (contrast) that may be used during the test. What happens before the test? No specific preparation is needed. You may eat and drink normally. What happens during the test? You will take off your clothes from the waist up and put on a hospital gown. Electrodes or electrocardiogram (ECG)patches may be placed on your chest. The electrodes or patches are then connected to a device that monitors your heart rate and rhythm. You will lie down on a table for  an ultrasound exam. A gel will be applied to your chest to help sound waves pass through your skin. A handheld device, called a transducer, will be pressed against your chest and moved over your heart. The transducer produces sound waves that travel to your heart and bounce back (or  "echo" back) to the transducer. These sound waves will be captured in real-time and changed into images of your heart that can be viewed on a video monitor. The images will be recorded on a computer and reviewed by your health care provider. You may be asked to change positions or hold your breath for a short time. This makes it easier to get different views or better views of your heart. In some cases, you may receive contrast through an IV in one of your veins. This can improve the quality of the pictures from your heart. The procedure may vary among health care providers and hospitals.   What can I expect after the test? You may return to your normal, everyday life, including diet, activities, and medicines, unless your health care provider tells you not to do that. Follow these instructions at home: It is up to you to get the results of your test. Ask your health care provider, or the department that is doing the test, when your results will be ready. Keep all follow-up visits. This is important. Summary An echocardiogram is a test that uses sound waves (ultrasound) to produce images of the heart. Images from an echocardiogram can provide important information about the size and shape of your heart, heart muscle function, heart valve function, and other possible heart problems. You do not need to do anything to prepare before this test. You may eat and drink normally. After the echocardiogram is completed, you may return to your normal, everyday life, unless your health care provider tells you not to do that. This information is not intended to replace advice given to you by your health care provider. Make sure you discuss any questions you have with your health care provider. Document Revised: 09/13/2019 Document Reviewed: 09/13/2019 Elsevier Patient Education  2021 Elsevier Inc.      Follow-Up: At [facility], you and your health needs are our priority.  As part of our  continuing mission to provide you with exceptional heart care, we have created designated Provider Care Teams.  These Care Teams include your primary Cardiologist (physician) and Advanced Practice Providers (APPs -  Physician Assistants and Nurse Practitioners) who all work together to provide you with the care you need, when you need it.  We recommend signing up for the patient portal called "MyChart".  Sign up information is provided on this After Visit Summary.  MyChart is used to connect with patients for Virtual Visits (Telemedicine).  Patients are able to view lab/test results, encounter notes, upcoming appointments, etc.  Non-urgent messages can be sent to your provider as well.   To learn more about what you can do with MyChart, go to [email or web address].    Your next appointment:   6 month(s)  Provider:   Gypsy Balsam, MD    Other Instructions

## 2022-11-25 NOTE — Progress Notes (Signed)
Cardiology Office Note:    Date:  11/25/2022   ID:  Raymond Alvarez, DOB December 06, 1955, MRN [id number]  PCP:  Gordan Payment., MD  Cardiologist:  Gypsy Balsam, MD    Referring MD: Lynann Bologna, MD   Chief Complaint  Patient presents with   Follow-up    History of Present Illness:    Raymond Alvarez is a 67 y.o. male with past medical history significant for nonischemic cardiomyopathy latest ejection fraction 50 to 55%, cardiac catheterization performed showed only 10% narrowing which clearly does not explain degree of cardiomyopathy.  He does have BiV ICD, also dyslipidemia. Comes today to months for follow-up.  Overall he is doing great.  Asymptomatic looks good no chest pain tightness squeezing pressure burning chest.  Past Medical History:  Diagnosis Date   Allergy    Anemia    Anxiety    Anxiety disorder 03/20/2015   Asthma    Benign essential hypertension 03/20/2015   Last Assessment & Plan:  Relevant Hx: Course: Daily Update: Today's Plan:this appears stable overall  Electronically signed by: Jenelle Mages, FNP 03/20/15 1509  Last Assessment & Plan:  Formatting of this note might be different from the original. 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   Palpitations 03/20/2015   Prediabetes 06/17/2018   Prostate cancer (HCC)    Pulmonary nodule 05/12/2018   PVC (premature ventricular contraction) 03/20/2015   Rhabdomyolysis 05/12/2018   Seasonal allergies    Sleep apnea    Suspected COVID-19 virus infection 05/12/2018   Viral gastroenteritis 05/12/2018   Wears glasses     Past Surgical History:  Procedure Laterality Date   APPENDECTOMY  1970   BIV ICD INSERTION  CRT-D N/A 01/13/2019   Procedure: BIV ICD INSERTION CRT-D;  Surgeon: Regan Lemming, MD;  Location: [facility] INVASIVE CV LAB;  Service: Cardiovascular;  Laterality: N/A;   CARPAL TUNNEL RELEASE     COLONOSCOPY  09/24/2016   Colonic polyp status post polypectomy. Tubular adenoma.    ESOPHAGOGASTRODUODENOSCOPY  12/10/2016   Small hiatal hernia. Mild gastritis. Gastric  polyps status post polypectomy x 4    LEFT HEART CATH AND CORONARY ANGIOGRAPHY N/A 02/14/2021   Procedure: LEFT HEART CATH AND CORONARY ANGIOGRAPHY;  Surgeon: Corky Crafts, MD;  Location: [facility] INVASIVE CV LAB;  Service: Cardiovascular;  Laterality: N/A;   MASS EXCISION Left 12/15/2013   Procedure: EXCISION MUCOID TUMOR LEFT INDEX DEBRIDEMENT DISTAL INTERPHALANGEAL JOINT LEFT INDEX FINGER;  Surgeon: Cindee Salt, MD;  Location: [facility];  Service: Orthopedics;  Laterality: Left;   MENISCUS REPAIR     outer meniscus   POLYPECTOMY     PROSTATECTOMY  2015   TONSILLECTOMY      Current Medications: Current Meds  Medication Sig   Albuterol Sulfate (PROAIR RESPICLICK) 108 (90 Base) MCG/ACT AEPB Inhale 1-2 puffs into the lungs every 6 (six) hours as needed (wheezing/shortness of breath).   allopurinol (ZYLOPRIM) 300 MG tablet Take 300 mg by mouth in the morning.   aspirin EC 81 MG tablet Take 1 tablet (81 mg total) by mouth daily. Swallow whole.   atorvastatin (LIPITOR) 20 MG tablet Take 1 tablet (20 mg total) by mouth daily.   Black Pepper-Turmeric 3-500 MG CAPS Take 1 capsule by mouth in the morning.   carvedilol (COREG) 12.5 MG tablet Take 1 tablet (12.5 mg total) by mouth 2 (two) times daily.   cetirizine (ZYRTEC) 10 MG tablet Take 10 mg by mouth daily.   cyanocobalamin (,VITAMIN B-12,) 1000 MCG/ML injection Inject 1,000 mcg into the muscle every 30 (thirty) days.   dicyclomine (BENTYL) 10 MG capsule Take 1 capsule (10 mg total) by mouth 4 (four) times daily -  before meals and at bedtime. (Patient taking differently: Take 10 mg by mouth in the morning and at bedtime.)   ENTRESTO 49-51 MG TAKE 1 TABLET BY MOUTH TWICE DAILY   fexofenadine (ALLEGRA) 180 MG tablet Take 180 mg by mouth daily.   fluticasone (FLONASE) 50 MCG/ACT nasal spray Place 1 spray into both nostrils in the morning.   hyoscyamine (LEVSIN SL) 0.125 MG SL tablet Place 1 tablet (0.125 mg total) under the  tongue every 4 (four) hours as needed. (Patient taking differently: Place 0.125 mg under the tongue every 4 (four) hours as needed for cramping.)   LORazepam (ATIVAN) 1 MG tablet Take 1 mg by mouth daily as needed for anxiety.   methocarbamol (ROBAXIN) 750 MG tablet Take 750 mg by mouth every 4 (four) hours as needed for muscle spasms.   neomycin-polymyxin-hydrocortisone (CORTISPORIN) 3.5-10000-1 OTIC suspension Place 3 drops into both ears 3 (three) times daily as needed (pain/itching in ear(s).).   omeprazole (PRILOSEC) 20 MG capsule Take 20 mg by mouth daily.   Polyethyl Glycol-Propyl Glycol (SYSTANE ULTRA OP) Place 1 drop into both eyes in the morning.   Probiotic Product (PROBIOTIC PO) Take 1 capsule by mouth daily. Unknown strength   pyridOXINE (VITAMIN B-6) 100 MG tablet Take 200 mg by mouth in the morning.   spironolactone (ALDACTONE) 25 MG tablet Take 25 mg by mouth daily.    vortioxetine HBr (TRINTELLIX) 20 MG TABS tablet Take 20 mg by mouth every evening.  Allergies:   Other and Sulfa antibiotics   Social History   Socioeconomic History   Marital status: Married    Spouse name: Not on file   Number of children: 2   Years of education: Not on file   Highest education level: Not on file  Occupational History   Occupation: Passenger transport manager  Tobacco Use   Smoking status: Never   Smokeless tobacco: Never  Vaping Use   Vaping status: Never Used  Substance and Sexual Activity   Alcohol use: Yes    Comment: occ   Drug use: No   Sexual activity: Not on file  Other Topics Concern   Not on file  Social History Narrative   Not on file   Social Determinants of Health   Financial Resource Strain: Not on file  Food Insecurity: Not on file  Transportation Needs: Not on file  Physical Activity: Not on file  Stress: Not on file  Social Connections: Not on file     Family History: The patient's family history includes Breast cancer in his mother; Colon polyps in  his father and mother; Prostate cancer in his father and maternal grandfather. There is no history of Colon cancer, Esophageal cancer, Rectal cancer, or Stomach cancer. ROS:   Please see the history of present illness.    All 14 point review of systems negative except as described per history of present illness  EKGs/Labs/Other Studies Reviewed:         Recent Labs: No results found for requested labs within last 365 days.  Recent Lipid Panel    Component Value Date/Time   CHOL 169 02/11/2021 1114   TRIG 142 02/11/2021 1114   HDL 32 (L) 02/11/2021 1114   CHOLHDL 5.3 (H) 02/11/2021 1114   LDLCALC 111 (H) 02/11/2021 1114   LDLDIRECT 108 (H) 02/11/2021 1114    Physical Exam:    VS:  BP 110/74 (BP Location: Left Arm, Patient Position: Sitting)   Pulse 76   Ht 6\' 2"  (1.88 m)   Wt 287 lb (130.2 kg)   SpO2 98%   BMI 36.85 kg/m     Wt Readings from Last 3 Encounters:  11/25/22 287 lb (130.2 kg)  11/17/22 285 lb 3.2 oz (129.4 kg)  05/26/22 281 lb (127.5 kg)     GEN:  Well nourished, well developed in no acute distress HEENT: Normal NECK: No JVD; No carotid bruits LYMPHATICS: No lymphadenopathy CARDIAC: RRR, no murmurs, no rubs, no gallops RESPIRATORY:  Clear to auscultation without rales, wheezing or rhonchi  ABDOMEN: Soft, non-tender, non-distended MUSCULOSKELETAL:  No edema; No deformity  SKIN: Warm and dry LOWER EXTREMITIES: no swelling NEUROLOGIC:  Alert and oriented x 3 PSYCHIATRIC:  Normal affect   ASSESSMENT:    1. Essential hypertension   2. Chronic systolic heart failure (HCC)   3. Cardiomyopathy, unspecified type (HCC)   4. Coronary artery disease involving native coronary artery of native heart without angina pectoris   5. Benign essential hypertension   6. 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 Dyslipidemia I reviewed his fasting lipid profile done by primary care physician, HDL 37 LDL 56 this is from summer of this year.  Continue present management. History of cardiomyopathy.  He will repeat echocardiogram to recheck left ventricle ejection fraction, he is on excellent guideline directed medical therapy which I will continue. CRT ICD present.  Follow-up by our EP team, stable, normal function   Medication Adjustments/Labs and Tests Ordered: Current medicines are reviewed at length with the patient today.  Concerns regarding medicines are outlined above.  Orders Placed This Encounter  Procedures   ECHOCARDIOGRAM COMPLETE   Medication changes: No orders of the defined types were placed in this encounter.   Signed, Georgeanna Lea, MD, [facility] 11/25/2022 11:56 AM    [facility]

## 2022-11-26 ENCOUNTER — Ambulatory Visit: Payer: PPO | Admitting: Mental Health

## 2022-11-26 DIAGNOSIS — F411 Generalized anxiety disorder: Secondary | ICD-10-CM

## 2022-11-26 NOTE — Progress Notes (Signed)
[facility] psychotherapy note  Name: Raymond Alvarez Date:  11/25/22 MRN: [id number] DOB: 1955/02/08 PCP: Gordan Payment., MD  Time spent: 54 minutes  Treatment:   ind. Therapy  Mental Status Exam:    Appearance:    Casual     Behavior:   Appropriate  Motor:   WNL  Speech/Language:    Clear and Coherent  Affect:   Full range   Mood:   Euthymic  Thought process:   Logical, linear, goal directed  Thought content:     WNL  Sensory/Perceptual disturbances:     none  Orientation:   x4  Attention:   Good  Concentration:   Good  Memory:   Intact  Fund of knowledge:    Consistent with age and development  Insight:     Good  Judgment:    Good  Impulse Control:   Good     Reported Symptoms: Anxiety, rumination, depressed mood  Risk Assessment: Danger to Self:  No Self-injurious Behavior: No Danger to Others: No Duty to Warn:no Physical Aggression / Violence:No  Access to Firearms a concern: No  Gang Involvement:No  Patient / guardian was educated about steps to take if suicide or homicide risk level increases between visits: yes While future psychiatric events cannot be accurately predicted, the patient does not currently require acute inpatient psychiatric care and does not currently meet [facility] involuntary commitment criteria.    Subjective: Patient presented for session on time.  Assessed progress where patient shared how he and his wife had an renovations completed recently.  Patient shared how this has relieved some of his stress and anxiety.  He went on to share how he feels less stressed as discussed last session as he feels less overwhelmed with multiple tasks that need to be completed.  Reviewed ways he feels he can take steps to continue to establish or maintain boundaries in some relationships.  Other issues such as doctor appointments can cause anxiety and this was further explored collaboratively in session.  He identified the struggle with the aging  process, the idea of being less able to take care of himself in the next several years potentially.  He stated that he has worked to follow up walk prior to doctor appointments not to overanalyze to work on managing his anxiety.  Provide space for patient to identify issues process feelings while providing support and facilitating his identifying needs.  Interventions: Supportive therapy, CBT  Diagnoses:    ICD-10-CM   1. Anxiety state  F41.1           Plan: Patient is to use CBT, mindfulness and coping skills to help manage / decrease symptoms associated with their diagnosis.   Long-term goals:   Maintain symptom reduction: The patient will report sustained reduction in symptoms of anxiety using both CBT and mindfulness interventions for 3 consecutive months progressively.  Improve emotional regulation: The patient will learn and apply CBT and mindfulness-based strategies to regulate emotions, such as mindfulness-based stress reduction and cognitive restructuring, and report an improvement in emotional regulation for at least 3 consecutive months progressively.    Short-term goal:  The patient will learn and apply CBT and mindfulness-based coping skills for managing anxiety and practice using it between sessions.       2.   The patient will CBT and mindfulness-based interventions to increase awareness of negative thought patterns.       3.    The patient will identify and maintain boundaries as needed  in relationships to keep his stress and anxiety low.       Waldron Session, [facility]

## 2022-12-03 ENCOUNTER — Telehealth: Payer: Self-pay | Admitting: Cardiology

## 2022-12-03 NOTE — Telephone Encounter (Signed)
  1. Has your device fired? no  2. Is you device beeping? no  3. Are you experiencing draining or swelling at device site?   4. Are you calling to see if we received your device transmission?   5. Have you passed out? Questions about having a MRI   Please route to Device Clinic Pool

## 2022-12-03 NOTE — Telephone Encounter (Signed)
Called pt to advised his device IS MRI compatible. Pt. Appreciative of call.

## 2022-12-10 ENCOUNTER — Other Ambulatory Visit: Payer: Self-pay | Admitting: Cardiology

## 2022-12-10 ENCOUNTER — Ambulatory Visit: Payer: PPO | Admitting: Mental Health

## 2022-12-11 ENCOUNTER — Telehealth: Payer: Self-pay

## 2022-12-11 ENCOUNTER — Ambulatory Visit: Payer: PPO | Attending: Cardiology

## 2022-12-11 DIAGNOSIS — I429 Cardiomyopathy, unspecified: Secondary | ICD-10-CM

## 2022-12-11 DIAGNOSIS — I5022 Chronic systolic (congestive) heart failure: Secondary | ICD-10-CM

## 2022-12-11 LAB — ECHOCARDIOGRAM COMPLETE
Calc EF: 45.2 %
S' Lateral: 3.6 cm
Single Plane A2C EF: 46.2 %
Single Plane A4C EF: 43.6 %

## 2022-12-11 MED ORDER — PERFLUTREN LIPID MICROSPHERE
1.0000 mL | INTRAVENOUS | Status: AC | PRN
Start: 2022-12-11 — End: 2022-12-11
  Administered 2022-12-11: 8 mL via INTRAVENOUS

## 2022-12-11 NOTE — Telephone Encounter (Signed)
   [facility] Pre-operative Risk Assessment    Request for surgical clearance:  What type of surgery is being performed? Knee Arthroscopy    When is this surgery scheduled? TBD   What type of clearance is required (medical clearance vs. Pharmacy clearance to hold med vs. Both)? Both  Are there any medications that need to be held prior to surgery and how long?Not specified   Practice name and name of physician performing surgery? Dr. Georgena Spurling at [facility]  What is your office phone number: [phone]    7.   What is your office fax number: [phone]/ [phone]  8.   Anesthesia type (None, local, MAC, general) ? General Anesthesia    Tiburcio Pea Braylea Brancato 12/11/2022, 11:36 AM  _________________________________________________________________   (provider comments below)

## 2022-12-12 ENCOUNTER — Telehealth: Payer: Self-pay

## 2022-12-12 NOTE — Telephone Encounter (Signed)
Left message on My Chart with Echo results per Dr. Vanetta Shawl note. Routed to PCP.

## 2022-12-12 NOTE — Telephone Encounter (Signed)
Good Morning Dr. Bing Matter  We have received a surgical clearance request for Raymond Alvarez who is scheduled to undergo knee arthroscopy. They were seen recently in clinic on 11/25/2022.  He has a PMH of HFrEF, HTN, NICM s/p BiV ICD, CKD.  Can you please comment on surgical clearance and guidance on holding ASA 81 mg. Please forward you guidance and recommendations to P CV DIV PREOP   Thank you, Robin Searing, NP

## 2022-12-16 NOTE — Telephone Encounter (Signed)
Good Morning Dr. Bing Matter  We have received a surgical clearance request for Mr. Raymond Alvarez who is scheduled to undergo knee arthroscopy. They were seen recently in clinic on 11/25/2022.  He has a PMH of HFrEF, HTN, NICM s/p BiV ICD, CKD.  Can you please comment on surgical clearance and guidance on holding ASA 81 mg. Please forward you guidance and recommendations to P CV DIV PREOP   Thank you, Robin Searing, NP

## 2022-12-17 NOTE — Telephone Encounter (Signed)
See PREOP DEVICE CLEARANCE in separate encounter.

## 2022-12-17 NOTE — Progress Notes (Signed)
PERIOPERATIVE PRESCRIPTION FOR IMPLANTED CARDIAC DEVICE PROGRAMMING  Patient Information: Name:  Raymond Alvarez  DOB:  [birth year]  MRN:  [id number]    What type of surgery is being performed? Knee Arthroscopy     When is this surgery scheduled? TBD    What type of clearance is required (medical clearance vs. Pharmacy clearance to hold med vs. Both)? Both   Are there any medications that need to be held prior to surgery and how long?Not specified    Practice name and name of physician performing surgery? Dr. Ge[facility]gena Spurling at [facility]   What is your office phone number: [phone]    7.   What is your office fax number: [phone]/ [phone]   8.   Anesthesia type (None, local, MAC, general) ? General Anesthesia      Device Information:  Clinic EP Physician:  Loman Brooklyn, MD   Device Type:  Defibrillator Manufacturer and Phone #:  [employer]: [phone] Pacemaker Dependent?:  No. Date of Last Device Check:  11/17/2022 in office Normal Device Function?:  Yes.    Electrophysiologist's Recommendations:  Have magnet available. Provide continuous ECG monitoring when magnet is used or reprogramming is to be performed.  Procedure may interfere with device function.  Magnet should be placed over device during procedure.  Per Device Clinic Standing Orders, Kizzie Ide, RN  12:51 PM 12/17/2022

## 2022-12-17 NOTE — Telephone Encounter (Signed)
   Patient Name: Raymond Alvarez  DOB: May 17, 1955 MRN: [id number]  Primary Cardiologist: Gypsy Balsam, MD  Chart reviewed as part of pre-operative protocol coverage. Given past medical history and time since last visit, based on ACC/AHA guidelines, [name] is at acceptable risk for the planned procedure without further cardiovascular testing.   Per [facility] "It should be fine to stop aspirin 5 to 7 days before surgery, he also got defibrillator which need to be addressed around surgical time"    The patient was advised that if he develops new symptoms prior to surgery to contact our office to arrange for a follow-up visit, and he verbalized understanding.  I will route this recommendation to the requesting party via Epic fax function and remove from pre-op pool.  Please call with questions.  Joni Reining, NP 12/17/2022, 10:44 AM

## 2022-12-23 ENCOUNTER — Other Ambulatory Visit: Payer: Self-pay | Admitting: Cardiology

## 2022-12-24 ENCOUNTER — Other Ambulatory Visit: Payer: Self-pay | Admitting: Cardiology

## 2022-12-24 MED ORDER — ATORVASTATIN CALCIUM 20 MG PO TABS: 20.0000 mg | ORAL_TABLET | Freq: Every day | ORAL | 2 refills | Status: AC

## 2023-01-09 ENCOUNTER — Ambulatory Visit (INDEPENDENT_AMBULATORY_CARE_PROVIDER_SITE_OTHER): Payer: PPO

## 2023-01-09 DIAGNOSIS — I428 Other cardiomyopathies: Secondary | ICD-10-CM

## 2023-01-11 ENCOUNTER — Ambulatory Visit (HOSPITAL_BASED_OUTPATIENT_CLINIC_OR_DEPARTMENT_OTHER)
Admission: RE | Admit: 2023-01-11 | Discharge: 2023-01-11 | Disposition: A | Discharge status: Home / Self Care | Payer: PPO | Source: Ambulatory Visit | Attending: Internal Medicine | Admitting: Internal Medicine

## 2023-01-11 ENCOUNTER — Encounter (HOSPITAL_BASED_OUTPATIENT_CLINIC_OR_DEPARTMENT_OTHER): Payer: Self-pay

## 2023-01-11 ENCOUNTER — Ambulatory Visit (HOSPITAL_BASED_OUTPATIENT_CLINIC_OR_DEPARTMENT_OTHER)
Admission: RE | Admit: 2023-01-11 | Discharge: 2023-01-11 | Disposition: A | Discharge status: Home / Self Care | Payer: PPO | Source: Ambulatory Visit

## 2023-01-11 VITALS — BP 125/87 | HR 96 | Temp 98.4°F | Resp 22 | Wt 284.0 lb

## 2023-01-11 DIAGNOSIS — R0602 Shortness of breath: Secondary | ICD-10-CM

## 2023-01-11 DIAGNOSIS — J209 Acute bronchitis, unspecified: Secondary | ICD-10-CM

## 2023-01-11 DIAGNOSIS — R059 Cough, unspecified: Secondary | ICD-10-CM | POA: Insufficient documentation

## 2023-01-11 DIAGNOSIS — J45909 Unspecified asthma, uncomplicated: Secondary | ICD-10-CM | POA: Diagnosis not present

## 2023-01-11 MED ORDER — IPRATROPIUM-ALBUTEROL 0.5-2.5 (3) MG/3ML IN SOLN
3.0000 mL | Freq: Once | RESPIRATORY_TRACT | Status: AC
  Administered 2023-01-11: 3 mL via RESPIRATORY_TRACT

## 2023-01-11 MED ORDER — ALBUTEROL SULFATE (2.5 MG/3ML) 0.083% IN NEBU: 2.5000 mg | INHALATION_SOLUTION | Freq: Once | RESPIRATORY_TRACT | Status: DC

## 2023-01-11 MED ORDER — PROMETHAZINE-DM 6.25-15 MG/5ML PO SYRP: 5.0000 mL | ORAL_SOLUTION | Freq: Every evening | ORAL | 0 refills | Status: DC | PRN

## 2023-01-11 MED ORDER — DEXAMETHASONE SODIUM PHOSPHATE 10 MG/ML IJ SOLN
10.0000 mg | Freq: Once | INTRAMUSCULAR | Status: AC
  Administered 2023-01-11: 10 mg via INTRAMUSCULAR

## 2023-01-11 NOTE — ED Triage Notes (Signed)
Seen yesterday via telehealth for cough, body aches. Started on z-pack, prednisone, and benzonate. Was recommended to have chest x-ray done and be seen by someone in person as well.

## 2023-01-11 NOTE — ED Provider Notes (Signed)
[facility]    CSN: [id number] Arrival date & time: 01/11/23  8657      History   Chief Complaint Chief Complaint  Patient presents with   Influenza    Sore throat, severe cough, congestion. [facility] telemedicine advised chest x-ray due to cough severity. History of long COVID and heart failure. Has pacemaker. - Entered by patient    HPI Raymond Alvarez is a 67 y.o. male.   Patient presents to urgent care for evaluation of sore throat, cough, nasal congestion, and generalized fatigue that has been present for the last 6 days starting on Tuesday January 05, 2023. Cough is productive with yellow/green sputum and associated with lateral chest discomfort and shortness of breath.  Unsure of max temp at home, reports chills.  History of asthma, using albuterol inhaler with some temporary relief of shortness of breath. History of CHF, asthma, high blood pressure, chronic kidney disease, sleep apnea (uses CPAP), and diabetes.  Reports compliance with all of his medications for chronic illnesses.  Denies recent new orthopnea or leg swelling.  No recent weight changes.  He went to his PCP yesterday where he was prescribed prednisone burst (40 mg once daily for 5 days) and azithromycin.  He has been taking these medications without relief.  PCP recommends chest x-ray due to chronic medical problems but was unable to perform this yesterday.   Influenza   Past Medical History:  Diagnosis Date   Allergy    Anemia    Anxiety    Anxiety disorder 03/20/2015   Asthma    Benign essential hypertension 03/20/2015   Last Assessment & Plan:  Relevant Hx: Course: Daily Update: Today's Plan:this appears stable overall  Electronically signed by: Jenelle Mages, FNP 03/20/15 1509  Last Assessment & Plan:  Formatting of this note might be different from the original. Relevant Hx: Course: Daily Update: Today's Plan:this appears stable overall  Electronically signed by: Jenelle Mages, FNP 03/20/15    CHF (congestive heart failure) (HCC)    Chronic conjunctivitis of left eye 08/25/2019   Chronic kidney disease    Chronic systolic heart failure (HCC) 09/12/2019   Class 2 severe obesity due to excess calories with serious comorbidity and body mass index (BMI) of 35.0 to 35.9 in adult [facility]) 03/20/2015   Colon polyp    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   Depression    Dilated cardiomyopathy (HCC) 07/15/2018   Ejection fraction 30 to 35% based on echo in June 2020  Formatting of this note might be different from the original. 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   Palpitations 03/20/2015   Prediabetes 06/17/2018   Prostate cancer (HCC)    Pulmonary nodule 05/12/2018   PVC (premature ventricular contraction)  03/20/2015   Rhabdomyolysis 05/12/2018   Seasonal allergies    Sleep apnea    Suspected COVID-19 virus infection 05/12/2018   Viral gastroenteritis 05/12/2018   Wears glasses     Patient Active Problem List   Diagnosis Date Noted   Coronary artery disease involving native coronary artery of native heart without angina pectoris 03/13/2021   Abnormal nuclear stress test    Onychomycosis 05/28/2020   Wears glasses    Seasonal allergies    Prostate cancer (HCC)    Hypertension    Gout    GERD (gastroesophageal reflux disease)    Diverticulitis    Depression    Colon polyp    Chronic kidney disease    Asthma    Anxiety    Anemia    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   Erectile dysfunction 11/10/2019   History of prostate cancer 11/10/2019   Chronic systolic heart failure (HCC) 09/12/2019   Chronic conjunctivitis of left eye 08/25/2019   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   Obstructive sleep apnea 04/25/2019   Nonischemic cardiomyopathy (HCC) 07/15/2018   Dyspnea on exertion 06/25/2018   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/2018   NASH (nonalcoholic steatohepatitis) 06/17/2018   Prediabetes 06/17/2018   Viral gastroenteritis 05/12/2018   Suspected COVID-19 virus infection 05/12/2018   Rhabdomyolysis 05/12/2018   Pulmonary nodule 05/12/2018   History of 2019 novel coronavirus disease (COVID-19) 05/12/2018   Hyponatremia 05/11/2018   Iron deficiency anemia due to chronic blood loss 01/07/2017   Gastroesophageal reflux disease without esophagitis 07/31/2016   Anxiety disorder 03/20/2015   Benign essential hypertension 03/20/2015   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/20/2015   PVC (premature ventricular contraction) 03/20/2015   Class 2 severe obesity due to excess calories with serious comorbidity and body mass index (BMI) of 35.0 to 35.9 in adult (HCC) 03/20/2015   Vitamin B12 deficiency 03/20/2015    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 03/20/2015   Degeneration of lumbar intervertebral disc 03/20/2015   High risk medication use 03/20/2015   Hypogonadism male 03/20/2015   LVH (left ventricular hypertrophy) 03/20/2015   Malaise and fatigue 03/20/2015   BMI 36.0-36.9,adult 03/20/2015    Past Surgical History:  Procedure Laterality Date   APPENDECTOMY  1970   BIV ICD INSERTION CRT-D N/A 01/13/2019   Procedure: BIV ICD INSERTION CRT-D;  Surgeon: Regan Lemming, MD;  Location: [facility] INVASIVE CV LAB;  Service: Cardiovascular;  Laterality: N/A;   CARPAL TUNNEL RELEASE     COLONOSCOPY  09/24/2016   Colonic polyp status post polypectomy. Tubular adenoma.    ESOPHAGOGASTRODUODENOSCOPY  12/10/2016   Small hiatal hernia. Mild gastritis. Gastric polyps status post polypectomy x 4    LEFT HEART CATH AND CORONARY ANGIOGRAPHY N/A 02/14/2021   Procedure: LEFT HEART CATH AND CORONARY ANGIOGRAPHY;  Surgeon: Corky Crafts, MD;  Location: [facility] INVASIVE CV LAB;  Service: Cardiovascular;  Laterality: N/A;   MASS EXCISION Left 12/15/2013   Procedure: EXCISION MUCOID TUMOR LEFT INDEX DEBRIDEMENT DISTAL INTERPHALANGEAL JOINT LEFT INDEX FINGER;  Surgeon: Cindee Salt, MD;  Location: [facility];  Service: Orthopedics;  Laterality: Left;   MENISCUS REPAIR     outer meniscus   POLYPECTOMY     PROSTATECTOMY  2015   TONSILLECTOMY         Home Medications    Prior to Admission medications   Medication Sig Start Date End Date Taking? Authorizing Provider  azithromycin (ZITHROMAX) 250 MG tablet Take 250 mg by mouth daily.   Yes [provider]  guaiFENesin (MUCINEX) 600 MG 12 hr tablet Take 1,200 mg by mouth 2 (two) times daily.   Yes [provider]  predniSONE (DELTASONE) 20 MG tablet Take 40 mg by mouth daily with breakfast.   Yes [provider]  promethazine-dextromethorphan (PROMETHAZINE-DM) 6.25-15 MG/5ML syrup Take 5 mLs by mouth at  bedtime as needed for cough. 01/11/23  Yes Carlisle Beers, FNP  Albuterol Sulfate (PROAIR RESPICLICK) 108 (90 Base) MCG/ACT AEPB Inhale 1-2 puffs into the lungs every 6 (six) hours as needed (wheezing/shortness of breath).    [provider]  allopurinol (ZYLOPRIM) 300 MG tablet Take 300 mg by mouth in the morning.    [provider]  aspirin EC 81 MG tablet Take 1 tablet (81 mg total) by mouth daily. Swallow whole. 02/11/21   Georgeanna Lea, MD  atorvastatin (LIPITOR) 20 MG tablet Take 1 tablet (20 mg total) by mouth daily. 12/24/22   Georgeanna Lea, MD  Black Pepper-Turmeric 3-500 MG CAPS Take 1 capsule by mouth in the morning.    [provider]  carvedilol (COREG) 12.5 MG tablet Take 1 tablet (12.5 mg total) by mouth 2 (two) times daily. 07/24/22   Flossie Dibble, NP  cetirizine (ZYRTEC) 10 MG tablet Take 10 mg by mouth daily.    [provider]  cyanocobalamin (,VITAMIN B-12,) 1000 MCG/ML injection Inject 1,000 mcg into the muscle every 30 (thirty) days. 03/08/18   [provider]  dicyclomine (BENTYL) 10 MG capsule Take 1 capsule (10 mg total) by mouth 4 (four) times daily -  before meals and at bedtime. Patient taking differently: Take 10 mg by mouth in the morning and at bedtime. 01/12/19   Lynann Bologna, MD  ENTRESTO 49-51 MG TAKE 1 TABLET BY MOUTH TWICE DAILY 11/24/22   Georgeanna Lea, MD  fexofenadine (ALLEGRA) 180 MG tablet Take 180 mg by mouth daily.    [provider]  fluticasone (FLONASE) 50 MCG/ACT nasal spray Place 1 spray into both nostrils in the morning.    [provider]  hyoscyamine (LEVSIN SL) 0.125 MG SL tablet Place 1 tablet (0.125 mg total) under the tongue every 4 (four) hours as needed. Patient taking differently: Place 0.125 mg under the tongue every 4 (four) hours as needed for cramping. 10/07/17   Lynann Bologna, MD  LORazepam (ATIVAN) 1 MG tablet Take 1 mg by mouth daily as needed for  anxiety. 04/28/18   [provider]  methocarbamol (ROBAXIN) 750 MG tablet Take 750 mg by mouth every 4 (four) hours as needed for muscle spasms. 09/26/22   [provider]  neomycin-polymyxin-hydrocortisone (CORTISPORIN) 3.5-10000-1 OTIC suspension Place 3 drops into both ears 3 (three) times daily as needed (pain/itching in ear(s).).    [provider]  omeprazole (PRILOSEC) 20 MG capsule Take 20 mg by mouth daily.    [provider]  Polyethyl Glycol-Propyl Glycol (SYSTANE ULTRA OP) Place 1 drop into both eyes in the morning.    [provider]  Probiotic Product (PROBIOTIC PO) Take 1 capsule by mouth daily. Unknown strength  [provider]  pyridOXINE (VITAMIN B-6) 100 MG tablet Take 200 mg by mouth in the morning.    [provider]  spironolactone (ALDACTONE) 25 MG tablet Take 25 mg by mouth daily.  06/15/18   [provider]  vortioxetine HBr (TRINTELLIX) 20 MG TABS tablet Take 20 mg by mouth every evening.     [provider]    Family History Family History  Problem Relation Age of Onset   Colon polyps Mother    Breast cancer Mother    Colon polyps Father    Prostate cancer Father    Prostate cancer Maternal Grandfather    Colon cancer Neg Hx    Esophageal cancer Neg Hx    Rectal cancer Neg Hx    Stomach cancer Neg Hx     Social History Social History   Tobacco Use   Smoking status: Never   Smokeless tobacco: Never  Vaping Use   Vaping status: Never Used  Substance Use Topics   Alcohol use: Yes    Comment: occ   Drug use: No     Allergies   Other and Sulfa antibiotics   Review of Systems Review of Systems Per HPI  Physical Exam Triage Vital Signs ED Triage Vitals  Encounter Vitals Group     BP 01/11/23 0924 125/87     Systolic BP Percentile --      Diastolic BP Percentile --      Pulse Rate 01/11/23 0924 90     Resp 01/11/23 0924 (!) 22     Temp 01/11/23 0924 98.4 F  (36.9 C)     Temp Source 01/11/23 0924 Oral     SpO2 01/11/23 0924 95 %     Weight 01/11/23 0925 284 lb (128.8 kg)     Height --      Head Circumference --      Peak Flow --      Pain Score 01/11/23 0925 8     Pain Loc --      Pain Education --      Exclude from Growth Chart --    No data found.  Updated Vital Signs BP 125/87 (BP Location: Right Arm)   Pulse 96   Temp 98.4 F (36.9 C) (Oral)   Resp (!) 22   Wt 284 lb (128.8 kg)   SpO2 98%   BMI 36.46 kg/m   Visual Acuity Right Eye Distance:   Left Eye Distance:   Bilateral Distance:    Right Eye Near:   Left Eye Near:    Bilateral Near:     Physical Exam Vitals and nursing note reviewed.  Constitutional:      Appearance: He is ill-appearing. He is not toxic-appearing.  HENT:     Head: Normocephalic and atraumatic.     Right Ear: Hearing, tympanic membrane, ear canal and external ear normal.     Left Ear: Hearing, tympanic membrane, ear canal and external ear normal.     Nose: Congestion present.     Mouth/Throat:     Lips: Pink.     Mouth: Mucous membranes are moist. No injury.     Tongue: No lesions. Tongue does not deviate from midline.     Palate: No mass and lesions.     Pharynx: Oropharynx is clear. Uvula midline. Posterior oropharyngeal erythema present. No pharyngeal swelling, oropharyngeal exudate or uvula swelling.     Tonsils: No tonsillar exudate or tonsillar abscesses.  Eyes:     General:  Lids are normal. Vision grossly intact. Gaze aligned appropriately.     Extraocular Movements: Extraocular movements intact.     Conjunctiva/sclera: Conjunctivae normal.  Cardiovascular:     Rate and Rhythm: Normal rate and regular rhythm.     Heart sounds: Normal heart sounds, S1 normal and S2 normal.  Pulmonary:     Effort: Pulmonary effort is normal. No respiratory distress.     Breath sounds: Normal air entry. Wheezing (Focal wheezing heard to the right lower lung field with decreased breath sounds  throughout.) present. No rhonchi or rales.     Comments: Harsh, dry, frequent, and hacking cough on exam. Chest:     Chest wall: No tenderness.  Musculoskeletal:     Cervical back: Neck supple.     Right lower leg: No edema.     Left lower leg: No edema.  Lymphadenopathy:     Cervical: Cervical adenopathy present.  Skin:    General: Skin is warm and dry.     Capillary Refill: Capillary refill takes less than 2 seconds.     Findings: No rash.  Neurological:     General: No focal deficit present.     Mental Status: He is alert and oriented to person, place, and time. Mental status is at baseline.     Cranial Nerves: No dysarthria or facial asymmetry.  Psychiatric:        Mood and Affect: Mood normal.        Speech: Speech normal.        Behavior: Behavior normal.        Thought Content: Thought content normal.        Judgment: Judgment normal.      UC Treatments / Results  Labs (all labs ordered are listed, but only abnormal results are displayed) Labs Reviewed - No data to display  EKG   Radiology No results found.  Procedures Procedures (including critical care time)  Medications Ordered in UC Medications  dexamethasone (DECADRON) injection 10 mg (10 mg Intramuscular Given 01/11/23 1004)  ipratropium-albuterol (DUONEB) 0.5-2.5 (3) MG/3ML nebulizer solution 3 mL (3 mLs Nebulization Given 01/11/23 1004)    Initial Impression / Assessment and Plan / UC Course  I have reviewed the triage vital signs and the nursing notes.  Pertinent labs & imaging results that were available during my care of the patient were reviewed by me and considered in my medical decision making (see chart for details).   1.  Acute bronchitis, shortness of breath Presentation consistent with acute viral bronchitis, although chest x-ray ordered to rule out focal consolidation/pneumonia given length of symptoms and concern for infectious etiology.  Outpatient chest x-ray ordered.  I will call if  chest x-ray results indicate need for change in treatment plan.  Will treat in the meantime with steroid (dexamethasone 10mg  IM in clinic, continue prednisone burst for 5 days starting tomorrow), bronchodilator, cough suppressants for symptomatic relief, and expectorants (mucinex) as needed.   DuoNeb administered during visit with improvement in lung sounds/subjective shortness of breath, may use albuterol at home as needed for further symptomatic relief.    Counseled patient on potential for adverse effects with medications prescribed/recommended today, strict ER and return-to-clinic precautions discussed, patient verbalized understanding.    Final Clinical Impressions(s) / UC Diagnoses   Final diagnoses:  Acute bronchitis, unspecified organism  Shortness of breath     Discharge Instructions      Please go to med [facility] and have x-ray performed. Do not check  in to the ER. Go to imaging department, get images performed, then go home. You will receive a phone call if the x-ray shows any abnormal results requiring further treatment. If the x-ray results do not change our treatment plan, you will not receive a phone call.  Also see these results on MyChart.  Med [facility] [address] [place], [place]   - Take steroid pills sent to pharmacy as directed. Do not take any other NSAID containing medications such as ibuprofen or naproxen/Aleve while taking prednisone. - You may use albuterol inhaler 1 to 2 puffs every 4-6 hours as needed for cough, shortness of breath, and wheezing. - Take cough medicines as prescribed.  - Continue using over the counter medicines as needed as directed. Plain mucinex (guaifenesin) over the counter may further help breakup mucus and help with symptoms.   If you develop any new or worsening symptoms or do not improve in the next 2 to 3 days, please return.  If your symptoms are severe, please go to the emergency room. Follow-up  with PCP as needed.     ED Prescriptions     Medication Sig Dispense Auth. Provider   promethazine-dextromethorphan (PROMETHAZINE-DM) 6.25-15 MG/5ML syrup Take 5 mLs by mouth at bedtime as needed for cough. 118 mL Carlisle Beers, FNP      PDMP not reviewed this encounter.   Carlisle Beers, [place] 01/11/23 1022

## 2023-01-11 NOTE — Discharge Instructions (Signed)
Please go to med [facility] and have x-ray performed. Do not check in to the ER. Go to imaging department, get images performed, then go home. You will receive a phone call if the x-ray shows any abnormal results requiring further treatment. If the x-ray results do not change our treatment plan, you will not receive a phone call.  Also see these results on MyChart.  Med [facility] [address] [place], [place]   - Take steroid pills sent to pharmacy as directed. Do not take any other NSAID containing medications such as ibuprofen or naproxen/Aleve while taking prednisone. - You may use albuterol inhaler 1 to 2 puffs every 4-6 hours as needed for cough, shortness of breath, and wheezing. - Take cough medicines as prescribed.  - Continue using over the counter medicines as needed as directed. Plain mucinex (guaifenesin) over the counter may further help breakup mucus and help with symptoms.   If you develop any new or worsening symptoms or do not improve in the next 2 to 3 days, please return.  If your symptoms are severe, please go to the emergency room. Follow-up with PCP as needed.

## 2023-01-15 LAB — CUP PACEART REMOTE DEVICE CHECK
Battery Remaining Longevity: 38 mo
Battery Remaining Percentage: 46 %
Battery Voltage: 2.93 V
Brady Statistic AP VP Percent: 12 %
Brady Statistic AP VS Percent: 1 %
Brady Statistic AS VP Percent: 88 %
Brady Statistic AS VS Percent: 1 %
Brady Statistic RA Percent Paced: 12 %
Date Time Interrogation Session: 20241210105314
HighPow Impedance: 73 Ohm
Implantable Lead Connection Status: 753985
Implantable Lead Connection Status: 753985
Implantable Lead Connection Status: 753985
Implantable Lead Implant Date: 20201210
Implantable Lead Implant Date: 20201210
Implantable Lead Implant Date: 20201210
Implantable Lead Location: 753858
Implantable Lead Location: 753859
Implantable Lead Location: 753860
Implantable Pulse Generator Implant Date: 20201210
Lead Channel Impedance Value: 390 Ohm
Lead Channel Impedance Value: 430 Ohm
Lead Channel Impedance Value: 440 Ohm
Lead Channel Pacing Threshold Amplitude: 0.5 V
Lead Channel Pacing Threshold Amplitude: 0.5 V
Lead Channel Pacing Threshold Amplitude: 1.25 V
Lead Channel Pacing Threshold Pulse Width: 0.5 ms
Lead Channel Pacing Threshold Pulse Width: 0.5 ms
Lead Channel Pacing Threshold Pulse Width: 0.5 ms
Lead Channel Sensing Intrinsic Amplitude: 12 mV
Lead Channel Sensing Intrinsic Amplitude: 4.1 mV
Lead Channel Setting Pacing Amplitude: 2 V
Lead Channel Setting Pacing Amplitude: 2.5 V
Lead Channel Setting Pacing Amplitude: 2.5 V
Lead Channel Setting Pacing Pulse Width: 0.5 ms
Lead Channel Setting Pacing Pulse Width: 0.5 ms
Lead Channel Setting Sensing Sensitivity: 0.5 mV
Pulse Gen Serial Number: 111014317
Zone Setting Status: 755011

## 2023-01-19 ENCOUNTER — Ambulatory Visit: Payer: PPO | Admitting: Mental Health

## 2023-01-30 DIAGNOSIS — I428 Other cardiomyopathies: Secondary | ICD-10-CM

## 2023-02-13 ENCOUNTER — Other Ambulatory Visit: Payer: Self-pay | Admitting: Cardiology

## 2023-04-10 ENCOUNTER — Ambulatory Visit (INDEPENDENT_AMBULATORY_CARE_PROVIDER_SITE_OTHER): Payer: BC Managed Care – PPO

## 2023-04-10 DIAGNOSIS — I428 Other cardiomyopathies: Secondary | ICD-10-CM | POA: Diagnosis not present

## 2023-04-12 LAB — CUP PACEART REMOTE DEVICE CHECK
Battery Remaining Longevity: 36 mo
Battery Remaining Percentage: 43 %
Battery Voltage: 2.92 V
Brady Statistic AP VP Percent: 7.8 %
Brady Statistic AP VS Percent: 1 %
Brady Statistic AS VP Percent: 92 %
Brady Statistic AS VS Percent: 1 %
Brady Statistic RA Percent Paced: 7.5 %
Date Time Interrogation Session: 20250307020004
HighPow Impedance: 75 Ohm
Implantable Lead Connection Status: 753985
Implantable Lead Connection Status: 753985
Implantable Lead Connection Status: 753985
Implantable Lead Implant Date: 20201210
Implantable Lead Implant Date: 20201210
Implantable Lead Implant Date: 20201210
Implantable Lead Location: 753858
Implantable Lead Location: 753859
Implantable Lead Location: 753860
Implantable Pulse Generator Implant Date: 20201210
Lead Channel Impedance Value: 360 Ohm
Lead Channel Impedance Value: 410 Ohm
Lead Channel Impedance Value: 430 Ohm
Lead Channel Pacing Threshold Amplitude: 0.5 V
Lead Channel Pacing Threshold Amplitude: 0.5 V
Lead Channel Pacing Threshold Amplitude: 1.25 V
Lead Channel Pacing Threshold Pulse Width: 0.5 ms
Lead Channel Pacing Threshold Pulse Width: 0.5 ms
Lead Channel Pacing Threshold Pulse Width: 0.5 ms
Lead Channel Sensing Intrinsic Amplitude: 12 mV
Lead Channel Sensing Intrinsic Amplitude: 4.5 mV
Lead Channel Setting Pacing Amplitude: 2 V
Lead Channel Setting Pacing Amplitude: 2.5 V
Lead Channel Setting Pacing Amplitude: 2.5 V
Lead Channel Setting Pacing Pulse Width: 0.5 ms
Lead Channel Setting Pacing Pulse Width: 0.5 ms
Lead Channel Setting Sensing Sensitivity: 0.5 mV
Pulse Gen Serial Number: 111014317
Zone Setting Status: 755011

## 2023-04-29 ENCOUNTER — Encounter (HOSPITAL_BASED_OUTPATIENT_CLINIC_OR_DEPARTMENT_OTHER): Payer: Self-pay | Admitting: Emergency Medicine

## 2023-04-29 ENCOUNTER — Ambulatory Visit (HOSPITAL_BASED_OUTPATIENT_CLINIC_OR_DEPARTMENT_OTHER): Admission: EM | Admit: 2023-04-29 | Discharge: 2023-04-29 | Disposition: A | Discharge status: Home / Self Care

## 2023-04-29 ENCOUNTER — Telehealth (HOSPITAL_BASED_OUTPATIENT_CLINIC_OR_DEPARTMENT_OTHER): Payer: Self-pay | Admitting: Family Medicine

## 2023-04-29 DIAGNOSIS — J208 Acute bronchitis due to other specified organisms: Secondary | ICD-10-CM | POA: Diagnosis not present

## 2023-04-29 DIAGNOSIS — R051 Acute cough: Secondary | ICD-10-CM | POA: Diagnosis not present

## 2023-04-29 DIAGNOSIS — R509 Fever, unspecified: Secondary | ICD-10-CM | POA: Diagnosis not present

## 2023-04-29 LAB — POC COVID19/FLU A&B COMBO
Covid Antigen, POC: NEGATIVE
Influenza A Antigen, POC: NEGATIVE
Influenza B Antigen, POC: NEGATIVE

## 2023-04-29 MED ORDER — PROMETHAZINE-DM 6.25-15 MG/5ML PO SYRP: 5.0000 mL | ORAL_SOLUTION | Freq: Four times a day (QID) | ORAL | 0 refills | Status: DC | PRN

## 2023-04-29 MED ORDER — PREDNISONE 20 MG PO TABS
20.0000 mg | ORAL_TABLET | Freq: Every day | ORAL | 0 refills | Status: AC
Start: 2023-04-29 — End: 2023-05-04

## 2023-04-29 NOTE — Telephone Encounter (Signed)
Phone note created in error. 

## 2023-04-29 NOTE — ED Triage Notes (Signed)
 Pt c/o coughing, sinus drainage started on Monday.

## 2023-04-29 NOTE — Discharge Instructions (Addendum)
 Negative for influenza type A, influenza type B, COVID.  Exam and history are consistent with a viral bronchitis.  Antibiotics not needed at this time.  Prednisone, 20 mg daily for 5 days.  Start tomorrow morning.  Promethazine DM, 5 mL, every 6 hours as needed for cough.  Uses albuterol inhaler, 2 puffs every 4-6 hours as needed for wheeze.  Get plenty of fluids and rest.  Follow-up if symptoms do not improve, worsen or new symptoms occur.

## 2023-04-29 NOTE — ED Provider Notes (Signed)
 [facility]    CSN: [id number] Arrival date & time: 04/29/23  1456      History   Chief Complaint No chief complaint on file.   HPI Raymond Alvarez is a 68 y.o. male.   Patient reports cough, head congestion and drainage, some sinus pressure, fever since Monday, 04/27/2023.  He did not feel good yesterday but he feels much worse today and decided he should come and get checked.     Past Medical History:  Diagnosis Date   Allergy    Anemia    Anxiety    Anxiety disorder 03/20/2015   Asthma    Benign essential hypertension 03/20/2015   Last Assessment & Plan:  Relevant Hx: Course: Daily Update: Today's Plan:this appears stable overall  Electronically signed by: Jenelle Mages, FNP 03/20/15 1509  Last Assessment & Plan:  Formatting of this note might be different from the original. 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   Palpitations 03/20/2015   Prediabetes 06/17/2018   Prostate cancer (HCC)    Pulmonary nodule 05/12/2018   PVC (premature ventricular contraction) 03/20/2015   Rhabdomyolysis 05/12/2018   Seasonal allergies    Sleep apnea    Suspected COVID-19 virus infection 05/12/2018   Viral gastroenteritis 05/12/2018   Wears glasses     Patient Active Problem List   Diagnosis Date Noted   Coronary artery disease involving native coronary artery of native heart without angina pectoris 03/13/2021   Abnormal nuclear stress test    Onychomycosis 05/28/2020   Wears glasses    Seasonal allergies    Prostate cancer (HCC)    Hypertension    Gout    GERD (gastroesophageal reflux disease)  Diverticulitis    Depression    Colon polyp    Chronic kidney disease    Asthma    Anxiety    Anemia    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   Erectile dysfunction 11/10/2019   History of prostate cancer 11/10/2019   Chronic systolic heart failure (HCC) 09/12/2019   Chronic conjunctivitis of left eye 08/25/2019   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   Obstructive sleep apnea 04/25/2019    Nonischemic cardiomyopathy (HCC) 07/15/2018   Dyspnea on exertion 06/25/2018   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/2018   NASH (nonalcoholic steatohepatitis) 06/17/2018   Prediabetes 06/17/2018   Viral gastroenteritis 05/12/2018   Suspected COVID-19 virus infection 05/12/2018   Rhabdomyolysis 05/12/2018   Pulmonary nodule 05/12/2018   History of 2019 novel coronavirus disease (COVID-19) 05/12/2018   Hyponatremia 05/11/2018   Iron deficiency anemia due to chronic blood loss 01/07/2017   Gastroesophageal reflux disease without esophagitis 07/31/2016   Anxiety disorder 03/20/2015   Benign essential hypertension 03/20/2015   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/20/2015   PVC (premature ventricular contraction) 03/20/2015   Class 2 severe obesity due to excess calories with serious comorbidity and body mass index (BMI) of 35.0 to 35.9 in adult (HCC) 03/20/2015   Vitamin B12 deficiency 03/20/2015   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 03/20/2015   Degeneration of lumbar intervertebral disc 03/20/2015   High risk medication use 03/20/2015   Hypogonadism male 03/20/2015   LVH (left ventricular hypertrophy) 03/20/2015   Malaise and fatigue 03/20/2015   BMI 36.0-36.9,adult 03/20/2015    Past Surgical History:  Procedure Laterality Date   APPENDECTOMY  1970   BIV ICD INSERTION CRT-D N/A 01/13/2019   Procedure: BIV ICD INSERTION CRT-D;  Surgeon: Regan Lemming, MD;  Location: [facility] INVASIVE CV LAB;  Service: Cardiovascular;  Laterality: N/A;   CARPAL TUNNEL RELEASE     COLONOSCOPY  09/24/2016   Colonic polyp status post polypectomy. Tubular adenoma.    ESOPHAGOGASTRODUODENOSCOPY  12/10/2016   Small hiatal hernia. Mild gastritis. Gastric polyps status post polypectomy x 4    LEFT HEART CATH AND CORONARY ANGIOGRAPHY N/A 02/14/2021   Procedure: LEFT HEART CATH AND CORONARY ANGIOGRAPHY;  Surgeon: Corky Crafts, MD;  Location: [facility]  INVASIVE CV LAB;  Service: Cardiovascular;  Laterality: N/A;   MASS EXCISION Left 12/15/2013   Procedure: EXCISION MUCOID TUMOR LEFT INDEX DEBRIDEMENT DISTAL INTERPHALANGEAL JOINT LEFT INDEX FINGER;  Surgeon: Cindee Salt, MD;  Location: [facility];  Service: Orthopedics;  Laterality: Left;   MENISCUS REPAIR     outer meniscus   POLYPECTOMY     PROSTATECTOMY  2015   TONSILLECTOMY         Home Medications    Prior to Admission medications   Medication Sig Start Date End Date Taking? Authorizing Provider  Albuterol Sulfate (PROAIR RESPICLICK) 108 (90 Base) MCG/ACT AEPB Inhale 1-2 puffs into the lungs every 6 (six) hours as needed (wheezing/shortness of breath).   Yes [provider]  benzonatate (TESSALON) 200 MG capsule Take 200 mg by mouth. 01/10/23  Yes [provider]  cetirizine (ZYRTEC) 10 MG tablet Take 10 mg by mouth daily.   Yes [provider]  fluticasone (FLONASE) 50 MCG/ACT nasal spray Place 1 spray into both nostrils in the morning.   Yes [provider]  guaiFENesin (MUCINEX) 600 MG 12 hr tablet Take 1,200 mg by mouth 2 (two) times daily.   Yes [provider]  predniSONE (DELTASONE) 20 MG tablet Take 1 tablet (20 mg total) by mouth daily with breakfast for 5 days. 04/29/23 05/04/23 Yes Prescilla Sours, FNP  promethazine-dextromethorphan (PROMETHAZINE-DM) 6.25-15 MG/5ML syrup Take 5 mLs by mouth 4 (four) times daily as needed for cough. Do not use and drive - May make drowsy. 04/29/23  Yes Prescilla Sours, FNP  allopurinol (ZYLOPRIM) 300 MG tablet Take 300 mg by mouth in the morning.    [provider]  aspirin EC 81 MG tablet Take 1 tablet (81 mg total) by mouth daily. Swallow whole. 02/11/21   Georgeanna Lea, MD  atorvastatin (LIPITOR) 20 MG tablet Take 1 tablet (20 mg total) by mouth daily. 12/24/22   Georgeanna Lea, MD  Black Pepper-Turmeric 3-500 MG CAPS Take 1 capsule by mouth in the morning.    [provider]  carvedilol (COREG) 12.5 MG tablet Take 1 tablet (12.5 mg total) by mouth 2 (two) times daily. 07/24/22   Flossie Dibble, NP  cyanocobalamin (,VITAMIN B-12,) 1000 MCG/ML injection Inject 1,000 mcg into the muscle every 30 (thirty) days. 03/08/18   [provider]  dicyclomine (BENTYL) 10 MG capsule Take 1 capsule (10 mg total) by mouth 4 (four) times daily -  before meals and at bedtime. Patient taking differently: Take 10 mg by mouth in the morning and at bedtime. 01/12/19   Lynann Bologna, MD  fexofenadine (ALLEGRA) 180 MG tablet Take 180 mg by mouth daily.    [provider]  hyoscyamine (LEVSIN SL) 0.125 MG SL tablet Place 1 tablet (0.125 mg total) under the tongue every 4 (four) hours as needed. Patient taking differently: Place 0.125 mg under the tongue every 4 (four) hours as needed for cramping. 10/07/17   Lynann Bologna, MD  LORazepam (ATIVAN) 1 MG tablet Take 1 mg by mouth daily as needed for anxiety. 04/28/18   [provider]  methocarbamol (ROBAXIN) 750 MG tablet Take 750 mg by mouth every 4 (four) hours as needed for muscle spasms. 09/26/22   [provider]  neomycin-polymyxin-hydrocortisone (CORTISPORIN) 3.5-10000-1 OTIC suspension Place 3 drops into both ears 3 (three) times daily as needed (pain/itching in ear(s).).    [provider]  omeprazole (PRILOSEC) 20 MG capsule Take 20 mg by mouth daily.    [provider]  Polyethyl Glycol-Propyl Glycol (SYSTANE ULTRA OP) Place 1 drop into both eyes in the morning.    [provider]  Probiotic Product (PROBIOTIC PO) Take 1 capsule by mouth daily. Unknown strength    [provider]  pyridOXINE (VITAMIN B-6) 100 MG tablet Take 200 mg by mouth in the morning.    [provider]  sacubitril-valsartan (ENTRESTO) 49-51 MG Take 1 tablet by mouth 2 (two) times daily. 02/13/23   Georgeanna Lea, MD  spironolactone (ALDACTONE) 25 MG tablet Take 25 mg by  mouth daily.  06/15/18   [provider]  vortioxetine HBr (TRINTELLIX) 20 MG TABS tablet Take 20 mg by mouth every evening.     [provider]    Family History Family History  Problem Relation Age of Onset   Colon polyps Mother    Breast cancer Mother    Colon polyps Father    Prostate cancer Father    Prostate cancer Maternal Grandfather    Colon cancer Neg Hx    Esophageal cancer Neg Hx    Rectal cancer Neg Hx    Stomach cancer Neg Hx     Social History Social History  Tobacco Use   Smoking status: Never   Smokeless tobacco: Never  Vaping Use   Vaping status: Never Used  Substance Use Topics   Alcohol use: Yes    Comment: occ   Drug use: No     Allergies   Other and Sulfa antibiotics   Review of Systems Review of Systems  Constitutional:  Positive for fever. Negative for chills.  HENT:  Positive for congestion, postnasal drip, rhinorrhea and sinus pressure. Negative for ear pain and sore throat.   Eyes:  Negative for pain and visual disturbance.  Respiratory:  Positive for cough.   Cardiovascular:  Negative for chest pain and palpitations.  Gastrointestinal:  Negative for abdominal pain, constipation, diarrhea, nausea and vomiting.  Genitourinary:  Negative for dysuria and hematuria.  Musculoskeletal:  Positive for arthralgias. Negative for back pain.  Skin:  Negative for color change and rash.  Neurological:  Negative for seizures and syncope.  All other systems reviewed and are negative.    Physical Exam Triage Vital Signs ED Triage Vitals  Encounter Vitals Group     BP 04/29/23 1505 (!) 145/79     Systolic BP Percentile --      Diastolic BP Percentile --      Pulse Rate 04/29/23 1505 84     Resp 04/29/23 1505 20     Temp 04/29/23 1505 99.1 F (37.3 C)     Temp Source 04/29/23 1505 Oral     SpO2 04/29/23 1505 97 %     Weight --      Height --      Head Circumference --      Peak Flow --      Pain Score 04/29/23 1503 0      Pain Loc --      Pain Education --      Exclude from Growth Chart --    No data found.  Updated Vital Signs BP (!) 145/79 (BP Location: Right Arm)   Pulse 84   Temp 99.1 F (37.3 C) (Oral)   Resp 20   SpO2 97%   Visual Acuity Right Eye Distance:   Left Eye Distance:   Bilateral Distance:    Right Eye Near:   Left Eye Near:    Bilateral Near:     Physical Exam Vitals and nursing note reviewed.  Constitutional:      General: He is not in acute distress.    Appearance: He is well-developed. He is ill-appearing. He is not toxic-appearing.  HENT:     Head: Normocephalic and atraumatic.     Right Ear: Hearing, tympanic membrane, ear canal and external ear normal.     Left Ear: Hearing, tympanic membrane, ear canal and external ear normal.     Nose: Congestion and rhinorrhea present. Rhinorrhea is clear.     Right Sinus: No maxillary sinus tenderness or frontal sinus tenderness.     Left Sinus: No maxillary sinus tenderness or frontal sinus tenderness.     Mouth/Throat:     Lips: Pink.     Mouth: Mucous membranes are moist.     Pharynx: Uvula midline. No oropharyngeal exudate or posterior oropharyngeal erythema.     Tonsils: No tonsillar exudate.  Eyes:     Conjunctiva/sclera: Conjunctivae normal.     Pupils: Pupils are equal, round, and reactive to light.  Cardiovascular:     Rate and Rhythm: Normal rate and regular rhythm.     Heart sounds: S1 normal and S2 normal. No murmur  heard. Pulmonary:     Effort: Pulmonary effort is normal. No respiratory distress.     Breath sounds: Normal breath sounds. No decreased breath sounds, wheezing, rhonchi or rales.  Abdominal:     General: Bowel sounds are normal.     Palpations: Abdomen is soft.     Tenderness: There is generalized abdominal tenderness (mild).  Musculoskeletal:        General: No swelling.     Cervical back: Neck supple.  Lymphadenopathy:     Head:     Right side of head: No submental, submandibular,  tonsillar, preauricular or posterior auricular adenopathy.     Left side of head: No submental, submandibular, tonsillar, preauricular or posterior auricular adenopathy.     Cervical: No cervical adenopathy.     Right cervical: No superficial cervical adenopathy.    Left cervical: No superficial cervical adenopathy.  Skin:    General: Skin is warm and dry.     Capillary Refill: Capillary refill takes less than 2 seconds.     Findings: No rash.  Neurological:     Mental Status: He is alert and oriented to person, place, and time.  Psychiatric:        Mood and Affect: Mood normal.      UC Treatments / Results  Labs (all labs ordered are listed, but only abnormal results are displayed) Labs Reviewed  POC COVID19/FLU A&B COMBO - Normal    EKG   Radiology No results found.  Procedures Procedures (including critical care time)  Medications Ordered in UC Medications - No data to display  Initial Impression / Assessment and Plan / UC Course  I have reviewed the triage vital signs and the nursing notes.  Pertinent labs & imaging results that were available during my care of the patient were reviewed by me and considered in my medical decision making (see chart for details).     Viral bronchitis: Prednisone, 20 mg daily for 5 days.  Promethazine DM, 5 mL, every 6 hours as needed for cough.  Use the albuterol inhaler, 2 puffs every 4-6 hours as needed for wheezing.  He was negative for flu and COVID.  Get plenty of fluids and rest.  Follow-up if symptoms do not improve, worsen or new symptoms occur. Final Clinical Impressions(s) / UC Diagnoses   Final diagnoses:  Acute cough  Fever, unspecified  Acute viral bronchitis     Discharge Instructions      Negative for influenza type A, influenza type B, COVID.  Exam and history are consistent with a viral bronchitis.  Antibiotics not needed at this time.  Prednisone, 20 mg daily for 5 days.  Start tomorrow morning.   Promethazine DM, 5 mL, every 6 hours as needed for cough.  Uses albuterol inhaler, 2 puffs every 4-6 hours as needed for wheeze.  Get plenty of fluids and rest.  Follow-up if symptoms do not improve, worsen or new symptoms occur.     ED Prescriptions     Medication Sig Dispense Auth. Provider   promethazine-dextromethorphan (PROMETHAZINE-DM) 6.25-15 MG/5ML syrup Take 5 mLs by mouth 4 (four) times daily as needed for cough. Do not use and drive - May make drowsy. 118 mL Prescilla Sours, FNP   predniSONE (DELTASONE) 20 MG tablet Take 1 tablet (20 mg total) by mouth daily with breakfast for 5 days. 5 tablet Prescilla Sours, FNP      PDMP not reviewed this encounter.   Prescilla Sours, FNP 04/29/23 [phone]

## 2023-05-07 DIAGNOSIS — Z8669 Personal history of other diseases of the nervous system and sense organs: Secondary | ICD-10-CM | POA: Insufficient documentation

## 2023-05-12 NOTE — Addendum Note (Signed)
 Addended by: Elease Etienne A on: 05/12/2023 02:27 PM   Modules accepted: Orders

## 2023-05-12 NOTE — Progress Notes (Signed)
 Remote ICD transmission.

## 2023-05-27 ENCOUNTER — Ambulatory Visit: Admitting: Mental Health

## 2023-05-27 DIAGNOSIS — F411 Generalized anxiety disorder: Secondary | ICD-10-CM | POA: Diagnosis not present

## 2023-05-27 NOTE — Progress Notes (Signed)
 [facility] psychotherapy note  Name: Raymond Alvarez Date:   05/27/2023 MRN: [id number] DOB: Sep 13, 1955 PCP: Abbe Hoard., MD  Time spent: 50 minutes  Treatment:   ind. Therapy  Mental Status Exam:    Appearance:    Casual     Behavior:   Appropriate  Motor:   WNL  Speech/Language:    Clear and Coherent  Affect:   Full range   Mood:   Euthymic  Thought process:   Logical, linear, goal directed  Thought content:     WNL  Sensory/Perceptual disturbances:     none  Orientation:   x4  Attention:   Good  Concentration:   Good  Memory:   Intact  Fund of knowledge:    Consistent with age and development  Insight:     Good  Judgment:    Good  Impulse Control:   Good     Reported Symptoms: Anxiety, rumination, depressed mood  Risk Assessment: Danger to Self:  No Self-injurious Behavior: No Danger to Others: No Duty to Warn:no Physical Aggression / Violence:No  Access to Firearms a concern: No  Gang Involvement:No  Patient / guardian was educated about steps to take if suicide or homicide risk level increases between visits: yes While future psychiatric events cannot be accurately predicted, the patient does not currently require acute inpatient psychiatric care and does not currently meet [place]  involuntary commitment criteria.    Subjective: Patient presented for session on time.  Assessed progress over the last few months as his last appointment was about 6 months ago.  He shared how he and his wife were significantly ill over December of last year, they are improving in their health however they have had some challenges with seasonal allergies.  He went on to share family interactions, some stress with some family members due to political differences.  He shared how it has caused some anxiety at times.  Patient was encouraged to recognize ways he feels he could institute some boundaries where he was able to share how he has some relationships he can speak  more openly, while others less so.  Support and understanding was provided throughout, facilitating his identifying needs ways he is continue to cope and care for himself.  He stated that he is following through with doctor appointments, trying to get enough rest some challenges at night getting enough sleep.  Interventions: Supportive therapy, CBT  Diagnoses:  No diagnosis found.       Plan: Patient is to use CBT, mindfulness and coping skills to help manage / decrease symptoms associated with their diagnosis.   Long-term goals:   Maintain symptom reduction: The patient will report sustained reduction in symptoms of anxiety using both CBT and mindfulness interventions for 3 consecutive months progressively.  Improve emotional regulation: The patient will learn and apply CBT and mindfulness-based strategies to regulate emotions, such as mindfulness-based stress reduction and cognitive restructuring, and report an improvement in emotional regulation for at least 3 consecutive months progressively.    Short-term goal:  The patient will learn and apply CBT and mindfulness-based coping skills for managing anxiety and practice using it between sessions.       2.   The patient will CBT and mindfulness-based interventions to increase awareness of negative thought patterns.       3.    The patient will identify and maintain boundaries as needed in relationships to keep his stress and anxiety low.       Avram Lenis,  [facility]

## 2023-05-29 ENCOUNTER — Ambulatory Visit: Attending: Cardiology | Admitting: Cardiology

## 2023-05-29 ENCOUNTER — Encounter: Payer: Self-pay | Admitting: Cardiology

## 2023-05-29 VITALS — BP 110/80 | HR 80 | Ht 74.0 in | Wt 283.0 lb

## 2023-05-29 DIAGNOSIS — I428 Other cardiomyopathies: Secondary | ICD-10-CM | POA: Diagnosis not present

## 2023-05-29 DIAGNOSIS — I2510 Atherosclerotic heart disease of native coronary artery without angina pectoris: Secondary | ICD-10-CM | POA: Diagnosis not present

## 2023-05-29 DIAGNOSIS — I10 Essential (primary) hypertension: Secondary | ICD-10-CM

## 2023-05-29 DIAGNOSIS — I5022 Chronic systolic (congestive) heart failure: Secondary | ICD-10-CM

## 2023-05-29 DIAGNOSIS — R7303 Prediabetes: Secondary | ICD-10-CM

## 2023-05-29 NOTE — Patient Instructions (Signed)
 Medication Instructions:  Your physician recommends that you continue on your current medications as directed. Please refer to the Current Medication list given to you today.  *If you need a refill on your cardiac medications before your next appointment, please call your pharmacy*  Lab Work: None If you have labs (blood work) drawn today and your tests are completely normal, you will receive your results only by: MyChart Message (if you have MyChart) OR A paper copy in the mail If you have any lab test that is abnormal or we need to change your treatment, we will call you to review the results.  Testing/Procedures: None  Follow-Up: At [facility], you and your health needs are our priority.  As part of our continuing mission to provide you with exceptional heart care, our providers are all part of one team.  This team includes your primary Cardiologist (physician) and Advanced Practice Providers or APPs (Physician Assistants and Nurse Practitioners) who all work together to provide you with the care you need, when you need it.  Your next appointment:   6 month(s)  Provider:   Ralene Burger, MD    We recommend signing up for the patient portal called "MyChart".  Sign up information is provided on this After Visit Summary.  MyChart is used to connect with patients for Virtual Visits (Telemedicine).  Patients are able to view lab/test results, encounter notes, upcoming appointments, etc.  Non-urgent messages can be sent to your provider as well.   To learn more about what you can do with MyChart, go to [email or web address].   Other Instructions

## 2023-05-29 NOTE — Progress Notes (Signed)
 Cardiology Office Note:    Date:  05/29/2023   ID:  Raymond Alvarez, DOB [birth year], MRN [id number]  PCP:  Abbe Hoard., MD  Cardiologist:  Ralene Burger, MD    Referring MD: Abbe Hoard., MD   Chief Complaint  Patient presents with   Follow-up    History of Present Illness:    Khair Chasteen is a 68 y.o. male past medical history significant for nonischemic cardiomyopathy latest ejection fraction 45%, cardiac catheterization showed only 10% narrowing so he does have coronary artery disease but it does not justify degree of cardiomyopathy.  He is having a BiV ICD, dyslipidemia.  Comes today to months for follow-up overall doing great.  Asymptomatic no chest pain tightness squeezing pressure burning chest.  He is trying to be active and walk on the regular basis  Past Medical History:  Diagnosis Date   Allergy    Anemia    Anxiety    Anxiety disorder 03/20/2015   Asthma    Benign essential hypertension 03/20/2015   Last Assessment & Plan:  Relevant Hx: Course: Daily Update: Today's Plan:this appears stable overall  Electronically signed by: Carrol Clam, FNP 03/20/15 1509  Last Assessment & Plan:  Formatting of this note might be different from the original. 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   Palpitations 03/20/2015   Prediabetes 06/17/2018   Prostate cancer (HCC)    Pulmonary nodule 05/12/2018   PVC (premature ventricular contraction) 03/20/2015   Rhabdomyolysis 05/12/2018   Seasonal allergies    Sleep apnea    Suspected COVID-19 virus infection 05/12/2018   Viral gastroenteritis 05/12/2018   Wears glasses     Past Surgical History:  Procedure Laterality Date   APPENDECTOMY  1970   BIV ICD INSERTION CRT-D N/A 01/13/2019   Procedure: BIV ICD INSERTION CRT-D;  Surgeon: Lei Pump, MD;  Location: [facility] INVASIVE CV LAB;  Service: Cardiovascular;  Laterality: N/A;   CARPAL TUNNEL RELEASE     COLONOSCOPY  09/24/2016   Colonic polyp status post polypectomy. Tubular adenoma.    ESOPHAGOGASTRODUODENOSCOPY  12/10/2016    Small hiatal hernia. Mild gastritis. Gastric polyps status post polypectomy x 4    LEFT HEART CATH AND CORONARY ANGIOGRAPHY N/A 02/14/2021   Procedure: LEFT HEART CATH AND CORONARY ANGIOGRAPHY;  Surgeon: Lucendia Rusk, MD;  Location: [facility] INVASIVE CV LAB;  Service: Cardiovascular;  Laterality: N/A;   MASS EXCISION Left 12/15/2013   Procedure: EXCISION MUCOID TUMOR LEFT INDEX DEBRIDEMENT DISTAL INTERPHALANGEAL JOINT LEFT INDEX FINGER;  Surgeon: Lyanne Sample, MD;  Location: [facility];  Service: Orthopedics;  Laterality: Left;   MENISCUS REPAIR     outer meniscus   POLYPECTOMY     PROSTATECTOMY  2015   TONSILLECTOMY      Current Medications: Current Meds  Medication Sig   Albuterol  Sulfate (PROAIR  RESPICLICK) 108 (90 Base) MCG/ACT AEPB Inhale 1-2 puffs into the lungs every 6 (six) hours as needed (wheezing/shortness of breath).   allopurinol (ZYLOPRIM) 300 MG tablet Take 300 mg by mouth in the morning.   aspirin  EC 81 MG tablet Take 1 tablet (81 mg total) by mouth daily. Swallow whole.   atorvastatin  (LIPITOR) 20 MG tablet Take 1 tablet (20 mg total) by mouth daily.   benzonatate (TESSALON) 200 MG capsule Take 200 mg by mouth.   Black Pepper-Turmeric 3-500 MG CAPS Take 1 capsule by mouth in the morning.   carvedilol  (COREG ) 12.5 MG tablet Take 1 tablet (12.5 mg total) by mouth 2 (two) times daily.   cetirizine (ZYRTEC) 10 MG tablet Take 10 mg by mouth daily.   cyanocobalamin (,VITAMIN B-12,) 1000 MCG/ML injection Inject 1,000 mcg into the muscle every 30 (thirty) days.   diclofenac Sodium (VOLTAREN) 1 % GEL Apply 2 g topically 4 (four) times daily.   dicyclomine  (BENTYL ) 10 MG capsule Take 1 capsule (10 mg total) by mouth 4 (four) times daily -  before meals and at bedtime. (Patient taking differently: Take 10 mg by mouth in the morning and at bedtime.)   fexofenadine (ALLEGRA) 180 MG tablet Take 180 mg by mouth daily.   fluticasone (FLONASE) 50 MCG/ACT nasal spray Place 1  spray into both nostrils in the morning.   guaiFENesin  (MUCINEX ) 600 MG 12 hr tablet Take 1,200 mg by mouth 2 (two) times daily.   hyoscyamine  (LEVSIN SL) 0.125 MG SL tablet Place 1 tablet (0.125 mg total) under the tongue every 4 (four) hours as needed. (Patient taking differently: Place 0.125 mg under the tongue every 4 (four) hours as needed for cramping.)   latanoprost (XALATAN) 0.005 % ophthalmic solution Place 1 drop into both eyes at bedtime.   LORazepam  (ATIVAN ) 1 MG tablet Take 1 mg by mouth daily as needed for anxiety.   methocarbamol (ROBAXIN) 750 MG tablet Take 750 mg by mouth every 4 (four) hours as needed for muscle spasms.   neomycin-polymyxin-hydrocortisone (CORTISPORIN) 3.5-10000-1 OTIC suspension Place 3 drops into both ears 3 (three) times daily as needed (pain/itching in ear(s).).   omeprazole (PRILOSEC) 20 MG capsule Take 20 mg by mouth daily.   Polyethyl Glycol-Propyl Glycol (SYSTANE ULTRA OP) Place 1 drop into both eyes in the morning.  Probiotic Product (PROBIOTIC PO) Take 1 capsule by mouth daily. Unknown strength   promethazine -dextromethorphan  (PROMETHAZINE -DM) 6.25-15 MG/5ML syrup Take 5 mLs by mouth 4 (four) times daily as needed for cough. Do not use and drive - May make drowsy.   pyridOXINE (VITAMIN B-6) 100 MG tablet Take 200 mg by mouth in the morning.   sacubitril-valsartan (ENTRESTO ) 49-51 MG Take 1 tablet by mouth 2 (two) times daily.   spironolactone (ALDACTONE) 25 MG tablet Take 25 mg by mouth daily.    vortioxetine HBr (TRINTELLIX) 20 MG TABS tablet Take 20 mg by mouth every evening.      Allergies:   Other and Sulfa antibiotics   Social History   Socioeconomic History   Marital status: Married    Spouse name: Not on file   Number of children: 2   Years of education: Not on file   Highest education level: Not on file  Occupational History   Occupation: Passenger transport manager  Tobacco Use   Smoking status: Never   Smokeless tobacco: Never  Vaping  Use   Vaping status: Never Used  Substance and Sexual Activity   Alcohol use: Yes    Comment: occ   Drug use: No   Sexual activity: Not on file  Other Topics Concern   Not on file  Social History Narrative   Not on file   Social Drivers of Health   Financial Resource Strain: Not on file  Food Insecurity: Low Risk  (05/06/2023)   Received from [facility]   Hunger Vital Sign    Worried About Running Out of Food in the Last Year: Never true    Ran Out of Food in the Last Year: Never true  Transportation Needs: No Transportation Needs (05/06/2023)   Received from [facility]    In the past 12 months, has lack of reliable transportation kept you from medical appointments, meetings, work or from getting things needed for daily living? : No  Physical Activity: Not on file  Stress: Not on file  Social Connections: Not on file     Family History: The patient's family history includes Breast cancer in his mother; Colon polyps in his father and mother; Prostate cancer in his father and maternal grandfather. There is no history of Colon cancer, Esophageal cancer, Rectal cancer, or Stomach cancer. ROS:   Please see the history of present illness.    All 14 point review of systems negative except as described per history of present illness  EKGs/Labs/Other Studies Reviewed:    EKG Interpretation Date/Time:  Friday May 29 2023 15:34:59 EDT Ventricular Rate:  80 PR Interval:  140 QRS Duration:  164 QT Interval:  420 QTC Calculation: 484 R Axis:   -76  Text Interpretation: Atrial-sensed ventricular-paced rhythm Biventricular pacemaker detected Abnormal ECG When compared with ECG of 17-Nov-2022 11:49, Vent. rate has increased BY   9 BPM Confirmed by Ralene Burger [phone]) on 05/29/2023 3:58:00 PM    Recent Labs: No results found for requested labs within last 365 days.  Recent Lipid Panel    Component Value Date/Time   CHOL 169 02/11/2021 1114   TRIG 142  02/11/2021 1114   HDL 32 (L) 02/11/2021 1114   CHOLHDL 5.3 (H) 02/11/2021 1114   LDLCALC 111 (H) 02/11/2021 1114   LDLDIRECT 108 (H) 02/11/2021 1114    Physical Exam:    VS:  BP 110/80 (BP Location: Right Arm, Patient Position: Sitting)   Pulse 80   Ht 6\' 2"  (  1.88 m)   Wt 283 lb (128.4 kg)   SpO2 96%   BMI 36.34 kg/m     Wt Readings from Last 3 Encounters:  05/29/23 283 lb (128.4 kg)  01/11/23 284 lb (128.8 kg)  11/25/22 287 lb (130.2 kg)     GEN:  Well nourished, well developed in no acute distress HEENT: Normal NECK: No JVD; No carotid bruits LYMPHATICS: No lymphadenopathy CARDIAC: RRR, no murmurs, no rubs, no gallops RESPIRATORY:  Clear to auscultation without rales, wheezing or rhonchi  ABDOMEN: Soft, non-tender, non-distended MUSCULOSKELETAL:  No edema; No deformity  SKIN: Warm and dry LOWER EXTREMITIES: no swelling NEUROLOGIC:  Alert and oriented x 3 PSYCHIATRIC:  Normal affect   ASSESSMENT:    1. Nonischemic cardiomyopathy (HCC)   2. Essential hypertension   3. Chronic systolic heart failure (HCC)   4. Coronary artery disease involving native coronary artery of native heart without angina pectoris   5. Prediabetes    PLAN:    In order of problems listed above:  Cardiomyopathy.  Hemodynamically stable on appropriate guideline directed medical therapy that he can tolerate which I will continue. Essential hypertension blood pressure well-controlled. Dyslipidemia last cholesterol profile done by primary care physician on February 16, 2023 show HDL 38 LDL 63 good cholesterol control continue present management. Coronary artery disease stable only minimal disease based on cardiac catheterization, risk factors modifications   Medication Adjustments/Labs and Tests Ordered: Current medicines are reviewed at length with the patient today.  Concerns regarding medicines are outlined above.  Orders Placed This Encounter  Procedures   EKG 12-Lead   Medication  changes: No orders of the defined types were placed in this encounter.   Signed, Manfred Seed, MD, [facility] 05/29/2023 4:06 PM    [facility]

## 2023-06-11 ENCOUNTER — Ambulatory Visit: Admitting: Mental Health

## 2023-06-11 DIAGNOSIS — F411 Generalized anxiety disorder: Secondary | ICD-10-CM

## 2023-06-11 NOTE — Progress Notes (Signed)
 [facility] psychotherapy note  Name: Raymond Alvarez Date: 06/11/2023 MRN: [id number] DOB: [birth year] PCP: Abbe Hoard., MD  Time spent: 50 minutes  Treatment:   ind. Therapy  Mental Status Exam:    Appearance:    Casual     Behavior:   Appropriate  Motor:   WNL  Speech/Language:    Clear and Coherent  Affect:   Full range   Mood:   Euthymic  Thought process:   Logical, linear, goal directed  Thought content:     WNL  Sensory/Perceptual disturbances:     none  Orientation:   x4  Attention:   Good  Concentration:   Good  Memory:   Intact  Fund of knowledge:    Consistent with age and development  Insight:     Good  Judgment:    Good  Impulse Control:   Good     Reported Symptoms: Anxiety, rumination, depressed mood  Risk Assessment: Danger to Self:  No Self-injurious Behavior: No Danger to Others: No Duty to Warn:no Physical Aggression / Violence:No  Access to Firearms a concern: No  Gang Involvement:No  Patient / guardian was educated about steps to take if suicide or homicide risk level increases between visits: yes While future psychiatric events cannot be accurately predicted, the patient does not currently require acute inpatient psychiatric care and does not currently meet [place]  involuntary commitment criteria.    Subjective: Assessed as where he shared and continues to do house ruminations.  He stated that they have completed most of them and at this point he wants to try 1 day or careful financially, focus on her budget.  He shared how this will help his stress and he was able to talk to his wife about his concerns.  Time spent to allow him to discuss family relationships, his having a close relationship with his son while sharing some challenges in the relationship that they existed in part due to his son's mood challenges.  He stated that he receives treatment and has been helpful as he has been diagnosed bipolar in the past.  Further family  history, in childhood growing up in the [facility], feeling a sense of shame and guilt often due to the church teachings.  He went on to give many examples, how he feels this connects to the history of his anxiety and further shared how he feels it affected his life in some ways.  Presently, he continues to identify the need to draw boundaries with the amount of current events he will see on the news.  Sharing how it can be stressful and increase his anxiety at times.    Interventions: Supportive therapy, CBT  Diagnoses:    ICD-10-CM   1. Anxiety state  F41.1         Plan: Patient is to use CBT, mindfulness and coping skills to help manage / decrease symptoms associated with their diagnosis.   Long-term goals:   Maintain symptom reduction: The patient will report sustained reduction in symptoms of anxiety using both CBT and mindfulness interventions for 3 consecutive months progressively.  Improve emotional regulation: The patient will learn and apply CBT and mindfulness-based strategies to regulate emotions, such as mindfulness-based stress reduction and cognitive restructuring, and report an improvement in emotional regulation for at least 3 consecutive months progressively.    Short-term goal:  The patient will learn and apply CBT and mindfulness-based coping skills for managing anxiety and practice using it between sessions.  2.   The patient will CBT and mindfulness-based interventions to increase awareness of negative thought patterns.       3.    The patient will identify and maintain boundaries as needed in relationships to keep his stress and anxiety low.       Avram Lenis, [facility]

## 2023-07-09 ENCOUNTER — Ambulatory Visit: Admitting: Mental Health

## 2023-07-10 ENCOUNTER — Ambulatory Visit (INDEPENDENT_AMBULATORY_CARE_PROVIDER_SITE_OTHER): Payer: BC Managed Care – PPO

## 2023-07-10 DIAGNOSIS — I428 Other cardiomyopathies: Secondary | ICD-10-CM | POA: Diagnosis not present

## 2023-07-10 LAB — CUP PACEART REMOTE DEVICE CHECK
Battery Remaining Longevity: 32 mo
Battery Remaining Percentage: 39 %
Battery Voltage: 2.92 V
Brady Statistic AP VP Percent: 7 %
Brady Statistic AP VS Percent: 1 %
Brady Statistic AS VP Percent: 93 %
Brady Statistic AS VS Percent: 1 %
Brady Statistic RA Percent Paced: 6.7 %
Date Time Interrogation Session: 20250606031019
HighPow Impedance: 78 Ohm
Implantable Lead Connection Status: 753985
Implantable Lead Connection Status: 753985
Implantable Lead Connection Status: 753985
Implantable Lead Implant Date: 20201210
Implantable Lead Implant Date: 20201210
Implantable Lead Implant Date: 20201210
Implantable Lead Location: 753858
Implantable Lead Location: 753859
Implantable Lead Location: 753860
Implantable Pulse Generator Implant Date: 20201210
Lead Channel Impedance Value: 380 Ohm
Lead Channel Impedance Value: 410 Ohm
Lead Channel Impedance Value: 430 Ohm
Lead Channel Pacing Threshold Amplitude: 0.5 V
Lead Channel Pacing Threshold Amplitude: 0.5 V
Lead Channel Pacing Threshold Amplitude: 1.25 V
Lead Channel Pacing Threshold Pulse Width: 0.5 ms
Lead Channel Pacing Threshold Pulse Width: 0.5 ms
Lead Channel Pacing Threshold Pulse Width: 0.5 ms
Lead Channel Sensing Intrinsic Amplitude: 12 mV
Lead Channel Sensing Intrinsic Amplitude: 4.2 mV
Lead Channel Setting Pacing Amplitude: 2 V
Lead Channel Setting Pacing Amplitude: 2.5 V
Lead Channel Setting Pacing Amplitude: 2.5 V
Lead Channel Setting Pacing Pulse Width: 0.5 ms
Lead Channel Setting Pacing Pulse Width: 0.5 ms
Lead Channel Setting Sensing Sensitivity: 0.5 mV
Pulse Gen Serial Number: 111014317
Zone Setting Status: 755011

## 2023-07-20 ENCOUNTER — Ambulatory Visit: Payer: Self-pay | Admitting: Cardiology

## 2023-07-20 ENCOUNTER — Ambulatory Visit: Admitting: Mental Health

## 2023-07-20 DIAGNOSIS — F411 Generalized anxiety disorder: Secondary | ICD-10-CM

## 2023-07-20 NOTE — Progress Notes (Signed)
 [facility] psychotherapy note  Name: Broc Caspers Date: 07/20/23 MRN: [id number] DOB: 07-Mar-1955 PCP: Abbe Hoard., MD  Time spent: 51 minutes  Treatment:   ind. Therapy  Mental Status Exam:    Appearance:    Casual     Behavior:   Appropriate  Motor:   WNL  Speech/Language:    Clear and Coherent  Affect:   Full range   Mood:   Euthymic  Thought process:   Logical, linear, goal directed  Thought content:     WNL  Sensory/Perceptual disturbances:     none  Orientation:   x4  Attention:   Good  Concentration:   Good  Memory:   Intact  Fund of knowledge:    Consistent with age and development  Insight:     Good  Judgment:    Good  Impulse Control:   Good     Reported Symptoms: Anxiety, rumination, depressed mood  Risk Assessment: Danger to Self:  No Self-injurious Behavior: No Danger to Others: No Duty to Warn:no Physical Aggression / Violence:No  Access to Firearms a concern: No  Gang Involvement:No  Patient / guardian was educated about steps to take if suicide or homicide risk level increases between visits: yes While future psychiatric events cannot be accurately predicted, the patient does not currently require acute inpatient psychiatric care and does not currently meet [place]  involuntary commitment criteria.    Subjective:  Patient presented on time for today's session.  He shared how he [place] feelings of guilt related to his disclosing his feelings last session related to his religious history.  He shared details related to his being raised in [facility] faith, where feelings of guilt could be associated.  He further shared family history related to the guilt, more specifically not being enough to his parents.  He shared how he often felt that he did not have as many admirable qualities as his siblings.  Sharing further experiences of how extended family dynamics also played a role in his feelings about himself as well as his family.   His mother, who passed away due to accidental overdose, passed away earlier than his father.  How he continues to have interaction with his aunt, going on to share complexities of this relationship where he will often use humor to cope at times.  His wife, his primary emotional support, has been helpful and receptive when needed for him to process events, feelings.  Facilitated his identifying negative thoughts, beliefs and worked with him to reframe while also providing support with this being challenging.   Interventions: Supportive therapy, CBT  Diagnoses:    ICD-10-CM   1. Anxiety state  F41.1          Plan: Patient is to use CBT, mindfulness and coping skills to help manage / decrease symptoms associated with their diagnosis.   Long-term goals:   Maintain symptom reduction: The patient will report sustained reduction in symptoms of anxiety using both CBT and mindfulness interventions for 3 consecutive months progressively.  Improve emotional regulation: The patient will learn and apply CBT and mindfulness-based strategies to regulate emotions, such as mindfulness-based stress reduction and cognitive restructuring, and report an improvement in emotional regulation for at least 3 consecutive months progressively.    Short-term goal:  The patient will learn and apply CBT and mindfulness-based coping skills for managing anxiety and practice using it between sessions.       2.   The patient will CBT and mindfulness-based interventions  to increase awareness of negative thought patterns.       3.    The patient will identify and maintain boundaries as needed in relationships to keep his stress and anxiety low.       Avram Lenis, [facility]

## 2023-07-22 ENCOUNTER — Other Ambulatory Visit: Payer: Self-pay | Admitting: Cardiology

## 2023-08-17 ENCOUNTER — Ambulatory Visit: Admitting: Mental Health

## 2023-08-17 DIAGNOSIS — F411 Generalized anxiety disorder: Secondary | ICD-10-CM

## 2023-08-17 NOTE — Progress Notes (Signed)
 [facility] psychotherapy note  Name: Raymond Alvarez Date: 08/17/23 MRN: [id number] DOB: 1955-03-24 PCP: Thurmond Cathlyn LABOR., MD  Time spent: 50 minutes  Treatment:   ind. Therapy  Mental Status Exam:    Appearance:    Casual     Behavior:   Appropriate  Motor:   WNL  Speech/Language:    Clear and Coherent  Affect:   Full range   Mood:   Euthymic  Thought process:   Logical, linear, goal directed  Thought content:     WNL  Sensory/Perceptual disturbances:     none  Orientation:   x4  Attention:   Good  Concentration:   Good  Memory:   Intact  Fund of knowledge:    Consistent with age and development  Insight:     Good  Judgment:    Good  Impulse Control:   Good     Reported Symptoms: Anxiety, rumination, depressed mood  Risk Assessment: Danger to Self:  No Self-injurious Behavior: No Danger to Others: No Duty to Warn:no Physical Aggression / Violence:No  Access to Firearms a concern: No  Gang Involvement:No  Patient / guardian was educated about steps to take if suicide or homicide risk level increases between visits: yes While future psychiatric events cannot be accurately predicted, the patient does not currently require acute inpatient psychiatric care and does not currently meet   involuntary commitment criteria.    Subjective:  Patient presented on time for today's session.  Assessed progress where patient relationships with family.  He went on to share how his son continues to be out of work due to an injury that took place about a year ago.  At this point he is considering a different career path.  He stated that the son worked at the same school he taught at for most of his career.  Although he understands that his son wants to change, he has conflicted feelings about the way in which he is leaving his position; he had hoped that it would be more pleasant exit.  He identified the need to work on not worrying about his son, his decisions such as  employment; ways to do so that was explored collaboratively.  Patient was encouraged to recognize his strong supportive role he has for his son and his daughter, while also taking time to recognize when to set interpersonal boundaries to cope and care for himself.  He shared the many challenges over the years, his father passing, and then his mother and then his being diagnosed with cancer about 10 years ago.  Dealing with the loss of ones and his own health challenges, facilitated his processing feelings related.   Interventions: Supportive therapy, CBT  Diagnoses:    ICD-10-CM   1. Anxiety state  F41.1           Plan: Patient is to use CBT, mindfulness and coping skills to help manage / decrease symptoms associated with their diagnosis.   Long-term goals:   Maintain symptom reduction: The patient will report sustained reduction in symptoms of anxiety using both CBT and mindfulness interventions for 3 consecutive months progressively.  Improve emotional regulation: The patient will learn and apply CBT and mindfulness-based strategies to regulate emotions, such as mindfulness-based stress reduction and cognitive restructuring, and report an improvement in emotional regulation for at least 3 consecutive months progressively.    Short-term goal:  The patient will learn and apply CBT and mindfulness-based coping skills for managing anxiety and practice using it between  sessions.       2.   The patient will CBT and mindfulness-based interventions to increase awareness of negative thought patterns.       3.    The patient will identify and maintain boundaries as needed in relationships to keep his stress and anxiety low.       Lonni Fischer, [facility]

## 2023-08-25 NOTE — Progress Notes (Signed)
 Remote ICD transmission.

## 2023-09-07 ENCOUNTER — Ambulatory Visit (INDEPENDENT_AMBULATORY_CARE_PROVIDER_SITE_OTHER): Admitting: Mental Health

## 2023-09-07 DIAGNOSIS — F411 Generalized anxiety disorder: Secondary | ICD-10-CM

## 2023-09-07 NOTE — Progress Notes (Signed)
 [facility]  Name: Blanche Gallien Date: 09/07/23 MRN: [id number] DOB: 26-Aug-1955 PCP: Thurmond Cathlyn LABOR., MD  Time spent: 48 minutes  Treatment:   ind. Therapy  Mental Status Exam:    Appearance:    Casual     Behavior:   Appropriate  Motor:   WNL  Speech/Language:    Clear and Coherent  Affect:   Full range   Mood:   Euthymic  Thought process:   Logical, linear, goal directed  Thought content:     WNL  Sensory/Perceptual disturbances:     none  Orientation:   x4  Attention:   Good  Concentration:   Good  Memory:   Intact  Fund of knowledge:    Consistent with age and development  Insight:     Good  Judgment:    Good  Impulse Control:   Good     Reported Symptoms: Anxiety, rumination, depressed mood  Risk Assessment: Danger to Self:  No Self-injurious Behavior: No Danger to Others: No Duty to Warn:no Physical Aggression / Violence:No  Access to Firearms a concern: No  Gang Involvement:No  Patient / guardian was educated about steps to take if suicide or homicide risk level increases between visits: yes While future psychiatric events cannot be accurately predicted, the patient does not currently require acute inpatient psychiatric care and does not currently meet [place]  involuntary commitment criteria.    Subjective:  Patient presented on time for today's session. Assessed progress. Shared how he even his wife invited friends over, a couple that have been friends with for many years. Let me know when to share how the husband has been successful in various aspects of life and how a few years ago patient's wife had made complimentary comments about him often. Patient shared how this was upsetting to the point where he finally shared his feelings openly with her where he was very direct, assertive in his expression. He said that they did not talk for about 2 days, he started individuals therapy soon thereafter, about 8 to 10 years ago. He  stated they recently had this couple over again, how it went well overall.  Patient was encouraged to recognize feelings he had at this recent gathering, where he was able to share challenges he has with the husband of the couple as he often has controlling tendencies and conversations which he went on to share more detail.  He identified his tendency to compare himself to him, although he knows he has positive attributes, strengths.  He plans to continue to work on being mindful of his thinking particularly when self depreciating.  Interventions: Supportive therapy, CBT  Diagnoses:    ICD-10-CM   1. Anxiety state  F41.1         Plan: Patient is to use CBT, mindfulness and coping skills to help manage / decrease symptoms associated with their diagnosis.   Long-term goals:   Maintain symptom reduction: The patient will report sustained reduction in symptoms of anxiety using both CBT and mindfulness interventions for 3 consecutive months progressively.  Improve emotional regulation: The patient will learn and apply CBT and mindfulness-based strategies to regulate emotions, such as mindfulness-based stress reduction and cognitive restructuring, and report an improvement in emotional regulation for at least 3 consecutive months progressively.    Short-term goal:  The patient will learn and apply CBT and mindfulness-based coping skills for managing anxiety and practice using it between sessions.       2.  The patient will CBT and mindfulness-based interventions to increase awareness of negative thought patterns.       3.    The patient will identify and maintain boundaries as needed in relationships to keep his stress and anxiety low.       Lonni Fischer, [facility]

## 2023-09-09 NOTE — Addendum Note (Signed)
 Addended by: BERNARDO BRUCKNER D on: 09/09/2023 04:09 PM   Modules accepted: Level of Service

## 2023-09-28 ENCOUNTER — Ambulatory Visit (INDEPENDENT_AMBULATORY_CARE_PROVIDER_SITE_OTHER): Admitting: Mental Health

## 2023-09-28 DIAGNOSIS — F411 Generalized anxiety disorder: Secondary | ICD-10-CM | POA: Diagnosis not present

## 2023-09-28 NOTE — Progress Notes (Addendum)
 [facility] psych[facility]  Name: Raymond Alvarez Date: 09/28/23 MRN: [id number] DOB: 1955/10/05 PCP: Thurmond Cathlyn LABOR., MD  Time spent: 51 minutes  Treatment:   ind. Therapy  Mental Status Exam:    Appearance:    Casual     Behavior:   Appropriate  Motor:   WNL  Speech/Language:    Clear and Coherent  Affect:   Full range   Mood:   Euthymic  Thought process:   Logical, linear, goal directed  Thought content:     WNL  Sensory/Perceptual disturbances:     none  Orientation:   x4  Attention:   Good  Concentration:   Good  Memory:   Intact  Fund of knowledge:    Consistent with age and development  Insight:     Good  Judgment:    Good  Impulse Control:   Good     Reported Symptoms: Anxiety, rumination, depressed mood  Risk Assessment: Danger to Self:  No Self-injurious Behavior: No Danger to Others: No Duty to Warn:no Physical Aggression / Violence:No  Access to Firearms a concern: No  Gang Involvement:No  Patient / guardian was educated about steps to take if suicide or homicide risk level increases between visits: yes While future psychiatric events cannot be accurately predicted, the patient does not currently require acute inpatient psychiatric care and does not currently meet [place]  involuntary commitment criteria.    Subjective:  Patient presented on time for today's session.  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  He stated this will be a new experience as he is not taught in this manner previously.  He admitted some intimidation due to feeling challenged technologically to an extent.  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  He stated that his wife considered returning to work but due to some  medical challenges this would be too difficult at this time.  He feels this will lower some of his stress and anxiety as they have had some increased financial challenges due to medical bills.  Through further guided discovery, he identified some experiences earlier in his life where his grandfather could be highly critical of him, his making the connection of how this sometimes impairs his self-confidence.  Collaboratively, explored ways to counter this through identifying self supportive thoughts.   Interventions: Supportive therapy, CBT  Diagnoses:    ICD-10-CM   1. Anxiety state  F41.1          Plan: Patient is to use CBT, mindfulness and coping skills to help manage / decrease symptoms associated with their diagnosis.   Long-term goals:   Maintain symptom reduction: The patient will report sustained reduction in symptoms of anxiety using both CBT and mindfulness interventions for 3 consecutive months progressively.  Improve emotional regulation: The patient will learn and apply CBT and mindfulness-based strategies to regulate emotions, such as mindfulness-based stress reduction and cognitive restructuring, and report an improvement in emotional regulation for at least 3 consecutive months progressively.    Short-term goal:  The patient will learn and apply CBT and mindfulness-based coping skills for managing anxiety and practice using it between sessions.       2.   The patient will CBT and mindfulness-based interventions to increase awareness of negative thought  patterns.       3.    The patient will identify and maintain boundaries as needed in relationships to keep his stress and anxiety low.       Lonni Fischer, [facility]

## 2023-10-09 ENCOUNTER — Ambulatory Visit (INDEPENDENT_AMBULATORY_CARE_PROVIDER_SITE_OTHER): Payer: BC Managed Care – PPO

## 2023-10-09 DIAGNOSIS — I428 Other cardiomyopathies: Secondary | ICD-10-CM

## 2023-10-10 LAB — CUP PACEART REMOTE DEVICE CHECK
Battery Remaining Longevity: 30 mo
Battery Remaining Percentage: 36 %
Battery Voltage: 2.92 V
Brady Statistic AP VP Percent: 8.3 %
Brady Statistic AP VS Percent: 1 %
Brady Statistic AS VP Percent: 92 %
Brady Statistic AS VS Percent: 1 %
Brady Statistic RA Percent Paced: 8.1 %
Date Time Interrogation Session: 20250905020738
HighPow Impedance: 69 Ohm
Implantable Lead Connection Status: 753985
Implantable Lead Connection Status: 753985
Implantable Lead Connection Status: 753985
Implantable Lead Implant Date: 20201210
Implantable Lead Implant Date: 20201210
Implantable Lead Implant Date: 20201210
Implantable Lead Location: 753858
Implantable Lead Location: 753859
Implantable Lead Location: 753860
Implantable Pulse Generator Implant Date: 20201210
Lead Channel Impedance Value: 360 Ohm
Lead Channel Impedance Value: 410 Ohm
Lead Channel Impedance Value: 430 Ohm
Lead Channel Pacing Threshold Amplitude: 0.5 V
Lead Channel Pacing Threshold Amplitude: 0.5 V
Lead Channel Pacing Threshold Amplitude: 1.25 V
Lead Channel Pacing Threshold Pulse Width: 0.5 ms
Lead Channel Pacing Threshold Pulse Width: 0.5 ms
Lead Channel Pacing Threshold Pulse Width: 0.5 ms
Lead Channel Sensing Intrinsic Amplitude: 12 mV
Lead Channel Sensing Intrinsic Amplitude: 4.1 mV
Lead Channel Setting Pacing Amplitude: 2 V
Lead Channel Setting Pacing Amplitude: 2.5 V
Lead Channel Setting Pacing Amplitude: 2.5 V
Lead Channel Setting Pacing Pulse Width: 0.5 ms
Lead Channel Setting Pacing Pulse Width: 0.5 ms
Lead Channel Setting Sensing Sensitivity: 0.5 mV
Pulse Gen Serial Number: 111014317
Zone Setting Status: 755011

## 2023-10-11 ENCOUNTER — Ambulatory Visit: Payer: Self-pay | Admitting: Cardiology

## 2023-10-17 NOTE — Progress Notes (Signed)
Remote ICD Transmission.

## 2023-10-19 ENCOUNTER — Ambulatory Visit: Admitting: Mental Health

## 2023-10-23 ENCOUNTER — Ambulatory Visit: Admitting: Mental Health

## 2023-10-23 DIAGNOSIS — F411 Generalized anxiety disorder: Secondary | ICD-10-CM | POA: Diagnosis not present

## 2023-10-23 NOTE — Progress Notes (Signed)
 [facility] psychotherapy note  Name: Raymond Alvarez Date: 10/23/23 MRN: [id number] DOB: Jul 02, 1955 PCP: Thurmond Cathlyn LABOR., MD  Time spent: 50 minutes  Treatment:   ind. Therapy  Mental Status Exam:    Appearance:    Casual     Behavior:   Appropriate  Motor:   WNL  Speech/Language:    Clear and Coherent  Affect:   Full range   Mood:   Euthymic  Thought process:   Logical, linear, goal directed  Thought content:     WNL  Sensory/Perceptual disturbances:     none  Orientation:   x4  Attention:   Good  Concentration:   Good  Memory:   Intact  Fund of knowledge:    Consistent with age and development  Insight:     Good  Judgment:    Good  Impulse Control:   Good     Reported Symptoms: Anxiety, rumination, depressed mood  Risk Assessment: Danger to Self:  No Self-injurious Behavior: No Danger to Others: No Duty to Warn:no Physical Aggression / Violence:No  Access to Firearms a concern: No  Gang Involvement:No  Patient / guardian was educated about steps to take if suicide or homicide risk level increases between visits: yes While future psychiatric events cannot be accurately predicted, the patient does not currently require acute inpatient psychiatric care and does not currently meet [place]  involuntary commitment criteria.    Subjective:  Patient presented on time for today's session.  Assessed progress per patient shared how he is decided to return to teaching, tutoring part-time.  He stated that he plans to do this at the turn of the next year due to other plans over the next few months.  He shared recent challenges with his adult son, giving more background and history related to his son's challenges with his mood.  He stated that his son is currently seeking disability long-term due to the ongoing challenges with his mental health.  Time I spent to provide support and facilitate his further identifying his own feelings associated with these challenges  he has had to witness for his son to endure.  Additional family dynamics related to their 2 children, with himself as well as with their mother was shared.  He identified how he has been the main source of emotional support as his 2 children will come to him more frequently than their mother as he shared how she is more of a problem solver.  He identified the need to facilitate their going to their mother more frequently with their needs particularly when it pertains directly to their own relationship with her.  He identified how he feels this boundary will be helpful for not only his relationship with his children but also with their mother.  Facilitated his framing needs, identifying feelings.  Interventions: Supportive therapy, CBT  Diagnoses:    ICD-10-CM   1. Anxiety state  F41.1           Plan: Patient is to use CBT, mindfulness and coping skills to help manage / decrease symptoms associated with their diagnosis.   Long-term goals:   Maintain symptom reduction: The patient will report sustained reduction in symptoms of anxiety using both CBT and mindfulness interventions for 3 consecutive months progressively.  Improve emotional regulation: The patient will learn and apply CBT and mindfulness-based strategies to regulate emotions, such as mindfulness-based stress reduction and cognitive restructuring, and report an improvement in emotional regulation for at least 3 consecutive months progressively.  Short-term goal:  The patient will learn and apply CBT and mindfulness-based coping skills for managing anxiety and practice using it between sessions.       2.   The patient will CBT and mindfulness-based interventions to increase awareness of negative thought patterns.       3.    The patient will identify and maintain boundaries as needed in relationships to keep his stress and anxiety low.       Lonni Fischer, [facility]

## 2023-11-13 ENCOUNTER — Ambulatory Visit: Admitting: Mental Health

## 2023-11-13 DIAGNOSIS — F411 Generalized anxiety disorder: Secondary | ICD-10-CM | POA: Diagnosis not present
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
## 2023-11-15 ENCOUNTER — Other Ambulatory Visit: Payer: Self-pay | Admitting: Cardiology

## 2023-11-27 ENCOUNTER — Ambulatory Visit: Attending: Cardiology | Admitting: Cardiology

## 2023-11-27 ENCOUNTER — Encounter: Payer: Self-pay | Admitting: Cardiology

## 2023-11-27 ENCOUNTER — Encounter: Payer: Self-pay | Admitting: *Deleted

## 2023-11-27 VITALS — BP 100/84 | HR 75 | Ht 74.0 in | Wt 289.0 lb

## 2023-11-27 DIAGNOSIS — R0609 Other forms of dyspnea: Secondary | ICD-10-CM

## 2023-11-27 DIAGNOSIS — I428 Other cardiomyopathies: Secondary | ICD-10-CM

## 2023-11-27 DIAGNOSIS — R7303 Prediabetes: Secondary | ICD-10-CM | POA: Diagnosis not present

## 2023-11-27 DIAGNOSIS — I2510 Atherosclerotic heart disease of native coronary artery without angina pectoris: Secondary | ICD-10-CM

## 2023-11-27 DIAGNOSIS — I10 Essential (primary) hypertension: Secondary | ICD-10-CM

## 2023-11-27 NOTE — Progress Notes (Signed)
 Cardiology Office Note:    Date:  11/27/2023   ID:  Raymond Alvarez, DOB 1955-03-16, MRN [id number]  PCP:  Raymond Cathlyn LABOR., MD  Cardiologist:  Raymond Fitch, MD    Referring MD: Raymond Cathlyn LABOR., MD   Chief Complaint  Patient presents with   Follow-up  Doing fine  History of Present Illness:    Raymond Alvarez is a 68 y.o. male past medical history significant for nonischemic cardiomyopathy last ejection fraction 45 to 50%, cardiac catheterization showing only 10% RV clearly does not explain his cardiomyopathy with nonischemic cardiomyopathy, also with BiV pacing history of dyslipidemia comes today to months for follow-up overall doing great PVCs at this is the best year ever he was able to work in the garden and do a lot of things no shortness of breath chest pain tightness squeezing pressure burning chest.  Does get tired sometimes  Past Medical History:  Diagnosis Date   Allergy    Anemia    Anxiety    Anxiety disorder 03/20/2015   Asthma    Benign essential hypertension 03/20/2015   Last Assessment & Plan:  Relevant Hx: Course: Daily Update: Today's Plan:this appears stable overall  Electronically signed by: Raymond Dariel Sayres, FNP 03/20/15 1509  Last Assessment & Plan:  Formatting of this note might be different from the original. 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   Palpitations 03/20/2015   Prediabetes 06/17/2018   Prostate cancer (HCC)    Pulmonary nodule 05/12/2018   PVC (premature ventricular contraction) 03/20/2015   Rhabdomyolysis 05/12/2018   Seasonal allergies    Sleep apnea    Suspected COVID-19 virus infection 05/12/2018   Viral gastroenteritis 05/12/2018   Wears glasses  Past Surgical History:  Procedure Laterality Date   APPENDECTOMY  1970   BIV ICD INSERTION CRT-D N/A 01/13/2019   Procedure: BIV ICD INSERTION CRT-D;  Surgeon: Raymond Soyla Lunger, MD;  Location: [facility] INVASIVE CV LAB;  Service: Cardiovascular;  Laterality: N/A;   CARPAL TUNNEL RELEASE     COLONOSCOPY  09/24/2016   Colonic polyp status post polypectomy.  Tubular adenoma.    ESOPHAGOGASTRODUODENOSCOPY  12/10/2016   Small hiatal hernia. Mild gastritis. Gastric polyps status post polypectomy x 4    LEFT HEART CATH AND CORONARY ANGIOGRAPHY N/A 02/14/2021   Procedure: LEFT HEART CATH AND CORONARY ANGIOGRAPHY;  Surgeon: Raymond Candyce RAMAN, MD;  Location: [facility] INVASIVE CV LAB;  Service: Cardiovascular;  Laterality: N/A;   MASS EXCISION Left 12/15/2013   Procedure: EXCISION MUCOID TUMOR LEFT INDEX DEBRIDEMENT DISTAL INTERPHALANGEAL JOINT LEFT INDEX FINGER;  Surgeon: Raymond Curia, MD;  Location: [facility];  Service: Orthopedics;  Laterality: Left;   MENISCUS REPAIR     outer meniscus   POLYPECTOMY     PROSTATECTOMY  2015   TONSILLECTOMY      Current Medications: Current Meds  Medication Sig   Albuterol  Sulfate (PROAIR  RESPICLICK) 108 (90 Base) MCG/ACT AEPB Inhale 1-2 puffs into the lungs every 6 (six) hours as needed (wheezing/shortness of breath).   allopurinol (ZYLOPRIM) 300 MG tablet Take 300 mg by mouth in the morning.   aspirin  EC 81 MG tablet Take 1 tablet (81 mg total) by mouth daily. Swallow whole.   atorvastatin  (LIPITOR) 20 MG tablet Take 1 tablet (20 mg total) by mouth daily.   B Complex-C (B-COMPLEX WITH VITAMIN C ) tablet Take 1 tablet by mouth daily.   benzonatate (TESSALON) 200 MG capsule Take 200 mg by mouth.   Black Pepper-Turmeric 3-500 MG CAPS Take 1 capsule by mouth in the morning.   carvedilol  (COREG ) 12.5 MG tablet TAKE ONE TABLET BY MOUTH TWICE DAILY   cetirizine (ZYRTEC) 10 MG tablet Take 10 mg by mouth daily.   cyanocobalamin (,VITAMIN B-12,) 1000 MCG/ML injection Inject 1,000 mcg into the muscle every 30 (thirty) days.   diclofenac Sodium (VOLTAREN) 1 % GEL Apply 2 g topically 4 (four) times daily.   dicyclomine  (BENTYL ) 10 MG capsule Take 1 capsule (10 mg total) by mouth 4 (four) times daily -  before meals and at bedtime. (Patient taking differently: Take 10 mg by mouth in the morning and at bedtime.)    fexofenadine (ALLEGRA) 180 MG tablet Take 180 mg by mouth daily.   fluticasone (FLONASE) 50 MCG/ACT nasal spray Place 1 spray into both nostrils in the morning.   guaiFENesin  (MUCINEX ) 600 MG 12 hr tablet Take 1,200 mg by mouth 2 (two) times daily.   hyoscyamine  (LEVSIN  SL) 0.125 MG SL tablet Place 1 tablet (0.125 mg total) under the tongue every 4 (four) hours as needed. (Patient taking differently: Place 0.125 mg under the tongue every 4 (four) hours as needed for cramping.)   latanoprost (XALATAN) 0.005 % ophthalmic solution Place 1 drop into both eyes at bedtime.   LORazepam  (ATIVAN ) 1 MG tablet Take 1 mg by mouth daily as needed for anxiety.   methocarbamol (ROBAXIN) 750 MG tablet Take 750 mg by mouth every 4 (four) hours as needed for muscle spasms.   neomycin-polymyxin-hydrocortisone (CORTISPORIN) 3.5-10000-1 OTIC suspension Place 3 drops into both ears 3 (three) times daily as needed (pain/itching in ear(s).).   omeprazole (PRILOSEC) 20 MG capsule Take 20 mg by mouth daily.   Polyethyl Glycol-Propyl Glycol (  SYSTANE ULTRA OP) Place 1 drop into both eyes in the morning.   Probiotic Product (PROBIOTIC PO) Take 1 capsule by mouth daily. Unknown strength   pyridOXINE (VITAMIN B-6) 100 MG tablet Take 200 mg by mouth in the morning.   sacubitril-valsartan (ENTRESTO ) 49-51 MG TAKE 1 TABLET BY MOUTH TWICE DAILY   spironolactone (ALDACTONE) 25 MG tablet Take 25 mg by mouth daily.    vortioxetine HBr (TRINTELLIX) 20 MG TABS tablet Take 20 mg by mouth every evening.      Allergies:   Other and Sulfa antibiotics   Social History   Socioeconomic History   Marital status: Married    Spouse name: Not on file   Number of children: 2   Years of education: Not on file   Highest education level: Not on file  Occupational History   Occupation: Passenger transport manager  Tobacco Use   Smoking status: Never   Smokeless tobacco: Never  Vaping Use   Vaping status: Never Used  Substance and Sexual  Activity   Alcohol use: Yes    Comment: occ   Drug use: No   Sexual activity: Not on file  Other Topics Concern   Not on file  Social History Narrative   Not on file   Social Drivers of Health   Financial Resource Strain: Not on file  Food Insecurity: Low Risk  (10/08/2023)   Received from [facility]   Hunger Vital Sign    Within the past 12 months, you worried that your food would run out before you got money to buy more: Never true    Within the past 12 months, the food you bought just didn't last and you didn't have money to get more. : Never true  Transportation Needs: No Transportation Needs (10/08/2023)   Received from [facility]    In the past 12 months, has lack of reliable transportation kept you from medical appointments, meetings, work or from getting things needed for daily living? : No  Physical Activity: Not on file  Stress: Not on file  Social Connections: Not on file     Family History: The patient's family history includes Breast cancer in his mother; Colon polyps in his father and mother; Prostate cancer in his father and maternal grandfather. There is no history of Colon cancer, Esophageal cancer, Rectal cancer, or Stomach cancer. ROS:   Please see the history of present illness.    All 14 point review of systems negative except as described per history of present illness  EKGs/Labs/Other Studies Reviewed:         Recent Labs: No results found for requested labs within last 365 days.  Recent Lipid Panel    Component Value Date/Time   CHOL 169 02/11/2021 1114   TRIG 142 02/11/2021 1114   HDL 32 (L) 02/11/2021 1114   CHOLHDL 5.3 (H) 02/11/2021 1114   LDLCALC 111 (H) 02/11/2021 1114   LDLDIRECT 108 (H) 02/11/2021 1114    Physical Exam:    VS:  BP 100/84   Pulse 75   Ht 6' 2 (1.88 m)   Wt 289 lb (131.1 kg)   SpO2 98%   BMI 37.11 kg/m     Wt Readings from Last 3 Encounters:  11/27/23 289 lb (131.1 kg)  05/29/23 283 lb  (128.4 kg)  01/11/23 284 lb (128.8 kg)     GEN:  Well nourished, well developed in no acute distress HEENT: Normal NECK: No JVD; No carotid bruits LYMPHATICS:  No lymphadenopathy CARDIAC: RRR, no murmurs, no rubs, no gallops RESPIRATORY:  Clear to auscultation without rales, wheezing or rhonchi  ABDOMEN: Soft, non-tender, non-distended MUSCULOSKELETAL:  No edema; No deformity  SKIN: Warm and dry LOWER EXTREMITIES: no swelling NEUROLOGIC:  Alert and oriented x 3 PSYCHIATRIC:  Normal affect   ASSESSMENT:    1. Nonischemic cardiomyopathy (HCC)   2. Coronary artery disease involving native coronary artery of native heart without angina pectoris   3. Benign essential hypertension   4. Prediabetes    PLAN:    In order of problems listed above:  Nonischemic cardiomyopathy compensated guideline directed medical therapy continue present management. Coronary disease only minimal.  Continue present management. Benign essential hypertension blood pressure actually low continue present management..  Diabetes that being followed by antimedicine team. Dyslipidemia will call primary care physician to get copy of his fasting lipid profile   Medication Adjustments/Labs and Tests Ordered: Current medicines are reviewed at length with the patient today.  Concerns regarding medicines are outlined above.  No orders of the defined types were placed in this encounter.  Medication changes: No orders of the defined types were placed in this encounter.   Signed, Raymond DOROTHA Fitch, MD, [facility] 11/27/2023 11:36 AM    [facility]

## 2023-11-27 NOTE — Patient Instructions (Addendum)

## 2023-12-16 ENCOUNTER — Ambulatory Visit: Admitting: Mental Health

## 2023-12-16 DIAGNOSIS — F411 Generalized anxiety disorder: Secondary | ICD-10-CM | POA: Diagnosis not present

## 2023-12-16 NOTE — Progress Notes (Signed)
 [facility] psychotherapy note  Name: Raymond Alvarez Date: 12/16/23 MRN: [id number] DOB: Sep 10, 1955 PCP: Thurmond Cathlyn LABOR., MD  Time spent: 50 minutes  Treatment:   ind. Therapy  Mental Status Exam:    Appearance:    Casual     Behavior:   Appropriate  Motor:   WNL  Speech/Language:    Clear and Coherent  Affect:   Full range   Mood:   Euthymic  Thought process:   Logical, linear, goal directed  Thought content:     WNL  Sensory/Perceptual disturbances:     none  Orientation:   x4  Attention:   Good  Concentration:   Good  Memory:   Intact  Fund of knowledge:    Consistent with age and development  Insight:     Good  Judgment:    Good  Impulse Control:   Good     Reported Symptoms: Anxiety, rumination, depressed mood  Risk Assessment: Danger to Self:  No Self-injurious Behavior: No Danger to Others: No Duty to Warn:no Physical Aggression / Violence:No  Access to Firearms a concern: No  Gang Involvement:No  Patient / guardian was educated about steps to take if suicide or homicide risk level increases between visits: yes While future psychiatric events cannot be accurately predicted, the patient does not currently require acute inpatient psychiatric care and does not currently meet [place]  involuntary commitment criteria.    Subjective:  Patient presented on time for today's session.  Assessed progress for patient shared how his moods been okay but stressed.  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  He stated that this can bring up some upsetting feelings, learning but some of them or their loved ones have passed away and also dealing with some other personalities as opposed to spending time with those he was closer.  Through further guided discovery, he stated he often feels overextended at times, being there for his wife, particularly due to her mobility challenges but also for his children.  He is the one they  often rely on for emotional support.  He identified the need to try and make time for himself when possible although he recognizes currently, this would be difficult due to their getting ready for Thanksgiving Christmas and have a vacation next week.  He plans to make time for his model building which is an outlet where he feels he can de-stress.     Interventions: Supportive therapy, CBT  Diagnoses:    ICD-10-CM   1. Anxiety state  F41.1         Plan: Patient is to use CBT, mindfulness and coping skills to help manage / decrease symptoms associated with their diagnosis.   Long-term goals:   Maintain symptom reduction: The patient will report sustained reduction in symptoms of anxiety using both CBT and mindfulness interventions for 3 consecutive months progressively.  Improve emotional regulation: The patient will learn and apply CBT and mindfulness-based strategies to regulate emotions, such as mindfulness-based stress reduction and cognitive restructuring, and report an improvement in emotional regulation for at least 3 consecutive months progressively.    Short-term goal:  The patient will learn and apply CBT and mindfulness-based coping skills for managing anxiety and practice using it between sessions.       2.   The patient will CBT and mindfulness-based interventions to increase awareness of negative thought patterns.       3.    The patient will  identify and maintain boundaries as needed in relationships to keep his stress and anxiety low.       Lonni Fischer, [facility]

## 2023-12-18 ENCOUNTER — Ambulatory Visit: Attending: Cardiology

## 2023-12-18 DIAGNOSIS — R0609 Other forms of dyspnea: Secondary | ICD-10-CM | POA: Diagnosis not present

## 2023-12-18 LAB — ECHOCARDIOGRAM COMPLETE
Area-P 1/2: 4.04 cm2
S' Lateral: 4.1 cm

## 2023-12-24 ENCOUNTER — Ambulatory Visit: Payer: Self-pay | Admitting: Cardiology

## 2024-01-08 ENCOUNTER — Ambulatory Visit: Payer: BC Managed Care – PPO

## 2024-01-08 DIAGNOSIS — I428 Other cardiomyopathies: Secondary | ICD-10-CM

## 2024-01-10 LAB — CUP PACEART REMOTE DEVICE CHECK
Battery Remaining Longevity: 29 mo
Battery Remaining Percentage: 35 %
Battery Voltage: 2.9 V
Brady Statistic AP VP Percent: 8.5 %
Brady Statistic AP VS Percent: 1 %
Brady Statistic AS VP Percent: 91 %
Brady Statistic AS VS Percent: 1 %
Brady Statistic RA Percent Paced: 8.3 %
Date Time Interrogation Session: 20251205022555
HighPow Impedance: 69 Ohm
Implantable Lead Connection Status: 753985
Implantable Lead Connection Status: 753985
Implantable Lead Connection Status: 753985
Implantable Lead Implant Date: 20201210
Implantable Lead Implant Date: 20201210
Implantable Lead Implant Date: 20201210
Implantable Lead Location: 753858
Implantable Lead Location: 753859
Implantable Lead Location: 753860
Implantable Pulse Generator Implant Date: 20201210
Lead Channel Impedance Value: 360 Ohm
Lead Channel Impedance Value: 430 Ohm
Lead Channel Impedance Value: 440 Ohm
Lead Channel Pacing Threshold Amplitude: 0.5 V
Lead Channel Pacing Threshold Amplitude: 0.5 V
Lead Channel Pacing Threshold Amplitude: 1.25 V
Lead Channel Pacing Threshold Pulse Width: 0.5 ms
Lead Channel Pacing Threshold Pulse Width: 0.5 ms
Lead Channel Pacing Threshold Pulse Width: 0.5 ms
Lead Channel Sensing Intrinsic Amplitude: 12 mV
Lead Channel Sensing Intrinsic Amplitude: 4.5 mV
Lead Channel Setting Pacing Amplitude: 2 V
Lead Channel Setting Pacing Amplitude: 2.5 V
Lead Channel Setting Pacing Amplitude: 2.5 V
Lead Channel Setting Pacing Pulse Width: 0.5 ms
Lead Channel Setting Pacing Pulse Width: 0.5 ms
Lead Channel Setting Sensing Sensitivity: 0.5 mV
Pulse Gen Serial Number: 111014317
Zone Setting Status: 755011

## 2024-01-12 NOTE — Progress Notes (Signed)
 Remote ICD Transmission

## 2024-01-13 ENCOUNTER — Ambulatory Visit: Payer: Self-pay | Admitting: Cardiology

## 2024-02-10 ENCOUNTER — Encounter: Payer: Self-pay | Admitting: Cardiology

## 2024-02-10 ENCOUNTER — Ambulatory Visit: Admitting: Mental Health

## 2024-02-10 DIAGNOSIS — F411 Generalized anxiety disorder: Secondary | ICD-10-CM

## 2024-02-10 NOTE — Progress Notes (Signed)
 [facility] psychotherapy note  Name: Raymond Alvarez Date: 02/10/23 MRN: [id number] DOB: 05-13-1955 PCP: Thurmond Cathlyn LABOR., MD  Time spent: 51 minutes  Treatment:   ind. Therapy  Mental Status Exam:    Appearance:    Casual     Behavior:   Appropriate  Motor:   WNL  Speech/Language:    Clear and Coherent  Affect:   Full range   Mood:   Euthymic  Thought process:   Logical, linear, goal directed  Thought content:     WNL  Sensory/Perceptual disturbances:     none  Orientation:   x4  Attention:   Good  Concentration:   Good  Memory:   Intact  Fund of knowledge:    Consistent with age and development  Insight:     Good  Judgment:    Good  Impulse Control:   Good     Reported Symptoms: Anxiety, rumination, depressed mood  Risk Assessment: Danger to Self:  No Self-injurious Behavior: No Danger to Others: No Duty to Warn:no Physical Aggression / Violence:No  Access to Firearms a concern: No  Gang Involvement:No  Patient / guardian was educated about steps to take if suicide or homicide risk level increases between visits: yes While future psychiatric events cannot be accurately predicted, the patient does not currently require acute inpatient psychiatric care and does not currently meet [place]  involuntary commitment criteria.    Subjective:  Patient presented on time for today's session.  Assessed progress for patient shared how he had a discussion with his wife recently after he had a meltdown over the weekend.  He stated that it was a culmination of being over tasked, trying to keep up with the many responsibilities, getting ready for Thanksgiving, then Christmas, being there for his adult children and differ capacities as well as for his wife particularly due to her limited mobility due to medical issues.  He stated that he needed his own down-time to destress and this has been very limited.  He stated that over the weekend he stopped to focus on himself, to  read for a few minutes and try and relax.  He stated that his wife then made a request of him where they had a discussion ensue.  He stated that he typically gets quiet when he gets to this point, he stated they were able to have a helpful discussion and how his wife wants him to speak up to her to let her know his needs and how he is feeling when he feels overwhelmed.  Through further guided discovery, he identified his tendency to view himself as a failure if he is not able to keep up with many responsibilities, this relating to experiences in childhood.  He plans to try to be more assertive in expressing his needs to his wife and facilitated his identifying what he needs to himself to allow this to occur.  Remind     Interventions: Supportive therapy, CBT  Diagnoses:    ICD-10-CM   1. Anxiety state  F41.1          Plan: Patient is to use CBT, mindfulness and coping skills to help manage / decrease symptoms associated with their diagnosis.   Long-term goals:   Maintain symptom reduction: The patient will report sustained reduction in symptoms of anxiety using both CBT and mindfulness interventions for 3 consecutive months progressively.  Improve emotional regulation: The patient will learn and apply CBT and mindfulness-based strategies to regulate emotions, such as mindfulness-based  stress reduction and cognitive restructuring, and report an improvement in emotional regulation for at least 3 consecutive months progressively.    Short-term goal:  The patient will learn and apply CBT and mindfulness-based coping skills for managing anxiety and practice using it between sessions.       2.   The patient will CBT and mindfulness-based interventions to increase awareness of negative thought patterns.       3.    The patient will identify and maintain boundaries as needed in relationships to keep his stress and anxiety low.       Lonni Fischer, [facility]

## 2024-02-11 ENCOUNTER — Other Ambulatory Visit: Payer: Self-pay

## 2024-02-11 MED ORDER — SACUBITRIL-VALSARTAN 49-51 MG PO TABS: 1.0000 | ORAL_TABLET | Freq: Two times a day (BID) | ORAL | 2 refills | Status: DC

## 2024-02-11 MED ORDER — SACUBITRIL-VALSARTAN 49-51 MG PO TABS: 1.0000 | ORAL_TABLET | Freq: Two times a day (BID) | ORAL | 2 refills | Status: AC

## 2024-02-11 NOTE — Addendum Note (Signed)
 Addended by: ARLOA MALLORY D on: 02/11/2024 07:49 AM   Modules accepted: Orders

## 2024-03-29 ENCOUNTER — Ambulatory Visit: Admitting: Mental Health

## 2024-04-08 ENCOUNTER — Ambulatory Visit

## 2024-07-08 ENCOUNTER — Ambulatory Visit

## 2024-10-07 ENCOUNTER — Ambulatory Visit

## 2025-01-06 ENCOUNTER — Ambulatory Visit
